# Patient Record
Sex: Female | Born: 1985 | Race: Black or African American | Hispanic: No | Marital: Single | Smoking: Never smoker
Health system: Southern US, Community
[De-identification: ages and names within clinical notes are randomized; demographics above are authoritative.]

## PROBLEM LIST (undated history)

## (undated) ENCOUNTER — Inpatient Hospital Stay (HOSPITAL_COMMUNITY): Payer: Self-pay

## (undated) DIAGNOSIS — E282 Polycystic ovarian syndrome: Secondary | ICD-10-CM

## (undated) DIAGNOSIS — R87629 Unspecified abnormal cytological findings in specimens from vagina: Secondary | ICD-10-CM

## (undated) DIAGNOSIS — B009 Herpesviral infection, unspecified: Secondary | ICD-10-CM

## (undated) DIAGNOSIS — E119 Type 2 diabetes mellitus without complications: Secondary | ICD-10-CM

## (undated) HISTORY — DX: Unspecified abnormal cytological findings in specimens from vagina: R87.629

---
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
## 2006-03-26 DIAGNOSIS — F5105 Insomnia due to other mental disorder: Secondary | ICD-10-CM | POA: Insufficient documentation

## 2006-03-26 DIAGNOSIS — J309 Allergic rhinitis, unspecified: Secondary | ICD-10-CM | POA: Insufficient documentation

## 2006-03-26 DIAGNOSIS — L708 Other acne: Secondary | ICD-10-CM | POA: Insufficient documentation

## 2006-03-26 DIAGNOSIS — G43909 Migraine, unspecified, not intractable, without status migrainosus: Secondary | ICD-10-CM | POA: Insufficient documentation

## 2006-10-08 ENCOUNTER — Telehealth: Payer: Self-pay | Admitting: *Deleted

## 2007-04-25 ENCOUNTER — Emergency Department (HOSPITAL_COMMUNITY): Admission: EM | Admit: 2007-04-25 | Discharge: 2007-04-26 | Payer: Self-pay | Admitting: Emergency Medicine

## 2007-07-05 ENCOUNTER — Inpatient Hospital Stay (HOSPITAL_COMMUNITY): Admission: AD | Admit: 2007-07-05 | Discharge: 2007-07-05 | Payer: Self-pay | Admitting: Family Medicine

## 2007-09-26 ENCOUNTER — Inpatient Hospital Stay (HOSPITAL_COMMUNITY): Admission: AD | Admit: 2007-09-26 | Discharge: 2007-09-26 | Payer: Self-pay | Admitting: Obstetrics & Gynecology

## 2007-10-05 ENCOUNTER — Ambulatory Visit (HOSPITAL_COMMUNITY): Admission: RE | Admit: 2007-10-05 | Discharge: 2007-10-05 | Payer: Self-pay | Admitting: Obstetrics and Gynecology

## 2007-10-22 ENCOUNTER — Inpatient Hospital Stay (HOSPITAL_COMMUNITY): Admission: AD | Admit: 2007-10-22 | Discharge: 2007-10-22 | Payer: Self-pay | Admitting: Obstetrics and Gynecology

## 2008-03-01 ENCOUNTER — Inpatient Hospital Stay (HOSPITAL_COMMUNITY): Admission: AD | Admit: 2008-03-01 | Discharge: 2008-03-04 | Payer: Self-pay | Admitting: Obstetrics & Gynecology

## 2008-03-23 ENCOUNTER — Inpatient Hospital Stay (HOSPITAL_COMMUNITY): Admission: AD | Admit: 2008-03-23 | Discharge: 2008-03-23 | Payer: Self-pay | Admitting: Obstetrics and Gynecology

## 2009-04-06 DIAGNOSIS — M545 Low back pain, unspecified: Secondary | ICD-10-CM | POA: Insufficient documentation

## 2009-04-23 ENCOUNTER — Encounter: Admission: RE | Admit: 2009-04-23 | Discharge: 2009-07-02 | Payer: Self-pay | Admitting: Family Medicine

## 2010-05-14 LAB — URINALYSIS, ROUTINE W REFLEX MICROSCOPIC
Bilirubin Urine: NEGATIVE
Glucose, UA: NEGATIVE mg/dL
Ketones, ur: NEGATIVE mg/dL
Leukocytes, UA: NEGATIVE
Nitrite: NEGATIVE
Protein, ur: NEGATIVE mg/dL
Specific Gravity, Urine: 1.02 (ref 1.005–1.030)
Urobilinogen, UA: 0.2 mg/dL (ref 0.0–1.0)
pH: 6.5 (ref 5.0–8.0)

## 2010-05-14 LAB — CBC
HCT: 29.3 % — ABNORMAL LOW (ref 36.0–46.0)
HCT: 33.5 % — ABNORMAL LOW (ref 36.0–46.0)
HCT: 41.7 % (ref 36.0–46.0)
HCT: 35.8 % — ABNORMAL LOW (ref 36.0–46.0)
Hemoglobin: 11.4 g/dL — ABNORMAL LOW (ref 12.0–15.0)
Hemoglobin: 13.9 g/dL (ref 12.0–15.0)
Hemoglobin: 9.9 g/dL — ABNORMAL LOW (ref 12.0–15.0)
Hemoglobin: 12 g/dL (ref 12.0–15.0)
MCHC: 33.3 g/dL (ref 30.0–36.0)
MCHC: 33.9 g/dL (ref 30.0–36.0)
MCHC: 33.9 g/dL (ref 30.0–36.0)
MCHC: 33.5 g/dL (ref 30.0–36.0)
MCV: 88.4 fL (ref 78.0–100.0)
MCV: 88.6 fL (ref 78.0–100.0)
MCV: 89.7 fL (ref 78.0–100.0)
MCV: 87.4 fL (ref 78.0–100.0)
Platelets: 159 10*3/uL (ref 150–400)
Platelets: 174 10*3/uL (ref 150–400)
Platelets: 192 10*3/uL (ref 150–400)
Platelets: 321 10*3/uL (ref 150–400)
RBC: 3.26 MIL/uL — ABNORMAL LOW (ref 3.87–5.11)
RBC: 3.79 MIL/uL — ABNORMAL LOW (ref 3.87–5.11)
RBC: 4.71 MIL/uL (ref 3.87–5.11)
RBC: 4.1 MIL/uL (ref 3.87–5.11)
RDW: 14.5 % (ref 11.5–15.5)
RDW: 15.1 % (ref 11.5–15.5)
RDW: 15.2 % (ref 11.5–15.5)
RDW: 15 % (ref 11.5–15.5)
WBC: 13.4 10*3/uL — ABNORMAL HIGH (ref 4.0–10.5)
WBC: 14.6 10*3/uL — ABNORMAL HIGH (ref 4.0–10.5)
WBC: 16.4 10*3/uL — ABNORMAL HIGH (ref 4.0–10.5)
WBC: 18.2 10*3/uL — ABNORMAL HIGH (ref 4.0–10.5)

## 2010-05-14 LAB — DIFFERENTIAL
Basophils Absolute: 0 10*3/uL (ref 0.0–0.1)
Basophils Relative: 0 % (ref 0–1)
Eosinophils Absolute: 0 10*3/uL (ref 0.0–0.7)
Eosinophils Relative: 0 % (ref 0–5)
Lymphocytes Relative: 6 % — ABNORMAL LOW (ref 12–46)
Lymphs Abs: 1.1 10*3/uL (ref 0.7–4.0)
Monocytes Absolute: 0.5 10*3/uL (ref 0.1–1.0)
Monocytes Relative: 3 % (ref 3–12)
Neutro Abs: 16.5 10*3/uL — ABNORMAL HIGH (ref 1.7–7.7)
Neutrophils Relative %: 91 % — ABNORMAL HIGH (ref 43–77)

## 2010-05-14 LAB — COMPREHENSIVE METABOLIC PANEL
ALT: 17 U/L (ref 0–35)
AST: 17 U/L (ref 0–37)
Albumin: 3.2 g/dL — ABNORMAL LOW (ref 3.5–5.2)
Alkaline Phosphatase: 91 U/L (ref 39–117)
BUN: 7 mg/dL (ref 6–23)
CO2: 26 mEq/L (ref 19–32)
Calcium: 8.9 mg/dL (ref 8.4–10.5)
Chloride: 105 mEq/L (ref 96–112)
Creatinine, Ser: 0.64 mg/dL (ref 0.4–1.2)
GFR calc Af Amer: >60 mL/min (ref >60)
GFR calc non Af Amer: >60 mL/min (ref >60)
Glucose, Bld: 123 mg/dL — ABNORMAL HIGH (ref 70–99)
Potassium: 4 mEq/L (ref 3.5–5.1)
Sodium: 138 mEq/L (ref 135–145)
Total Bilirubin: 0.7 mg/dL (ref 0.3–1.2)
Total Protein: 6.8 g/dL (ref 6.0–8.3)

## 2010-05-14 LAB — GC/CHLAMYDIA PROBE AMP, GENITAL
Chlamydia, DNA Probe: NEGATIVE
GC Probe Amp, Genital: NEGATIVE

## 2010-05-14 LAB — URINE MICROSCOPIC-ADD ON

## 2010-05-14 LAB — RPR: RPR Ser Ql: NONREACTIVE

## 2010-06-11 NOTE — Op Note (Signed)
[name], [name]              ACCOUNT NO.:  [id number]   MEDICAL RECORD NO.:  [id number]          PATIENT TYPE:  INP   LOCATION:  9122                          FACILITY:  [facility]   PHYSICIAN:  Carrington Clamp, M.D. DATE OF BIRTH:  1985/09/24   DATE OF PROCEDURE:  03/01/2008  DATE OF DISCHARGE:                               OPERATIVE REPORT   PREOPERATIVE DIAGNOSIS:  Arrest of active phase.   POSTOPERATIVE DIAGNOSIS:  Arrest of active phase.   PROCEDURES:  Primary low transverse cesarean section.   SURGEON:  Carrington Clamp, MD   ASSISTANTS:  None.   ANESTHESIA:  Epidural.   FINDINGS:  Female infant, vertex OP presentation, Apgars 9 and 9.  Weight  was 9 pounds 7 ounces.  Normal tubes, ovaries, and uterus were otherwise  seen.   SPECIMENS:  None.   ESTIMATED BLOOD LOSS:  800 mL.   IV FLUIDS:  1200 mL.   URINE OUTPUT:  175 mL.   COMPLICATIONS:  None.   MEDICATIONS:  Ancef and Pitocin.   The patient was sent to the PACU in good condition.   REASON FOR OPERATION:  Kristin Love was admitted this morning for  early labor and augmented and had a protracted labor course.  The  patient, however, remained at anterior lip for a significant amount of  time for about 4 hours.  The anterior lip was tight and would not be  reduced.  The patient reported that she had an ultrasound in the office  the previous week that indicated the baby was about 9 pounds.  It was  decided to do a C-section for arrest of active phase and all risks,  benefits, and alternatives were discussed with the patient.   TECHNIQUE:  After adequate epidural anesthesia was achieved, the patient  was prepped and draped in usual sterile fashion in dorsal supine  position with leftward tilt.  A Pfannenstiel skin incision with a  scalpel was carried down to the fascia with the Bovie cautery.  The  fascia was incised in the midline with the scalpel and carried in  transverse curvilinear manner with the  Mayo scissors.  Fascia was  reflected superiorly and inferiorly from the rectus muscle.  The rectus  muscle was split in the midline, the bowel free portion.  Peritoneum was  entered into bluntly.  The peritoneum was then opened in a superior  inferior manner with good visualization of bowel and the bladder.   The Alexis instrument was then introduced and the vesicouterine fascia  was tented up and incised in transverse curvilinear manner.  The bladder  flap was created with blunt dissection and 2-cm incision was made in the  upper portion of lower uterine segment.  Thick meconium was noted on  entry into the amnion.  The baby was identified in the vertex OP  presentation and delivered without complication through the incision.  Baby was bulb suctioned.  Cord was clamped and cut.  Baby was handed to  awaiting neonatologist.  The uterus was cleared with wet lap of all  debris of the placenta had been delivered.  Cord bloods had been  obtained.  The uterine incision was then closed with running locked  stitch of 0 Monocryl.  An imbricating layer of 0 Monocryl was performed  as well.  Once hemostasis was achieved, the peritoneum was closed after  irrigation with 2-0 Vicryl.  This incorporated the rectus muscles as a  separate layer.  The fascia was then closed with a running stitch of 0  Vicryl.  The subcutaneous tissue was rendered hemostatic with Bovie  cautery and irrigation.  The subcutaneous tissue was reapproximated with  interrupted stitches of 2-0 plain gut.  The skin was closed with  staples.      Carrington Clamp, M.D.  Electronically Signed     MH/MEDQ  D:  03/02/2008  T:  03/02/2008  Job:  [id number]

## 2010-06-14 NOTE — Discharge Summary (Signed)
Kristin Love, Kristin Love              ACCOUNT NO.:  [id number]   MEDICAL RECORD NO.:  [id number]          PATIENT TYPE:  INP   LOCATION:  9122                          FACILITY:  [facility]   PHYSICIAN:  Ilda Mori, M.D.   DATE OF BIRTH:  December 08, 1985   DATE OF ADMISSION:  03/01/2008  DATE OF DISCHARGE:  03/04/2008                               DISCHARGE SUMMARY   FINAL DIAGNOSES:  1. Intrauterine pregnancy at 40 plus weeks' gestation.  2. Active labor.  3. 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  4. Positive group B strep.  5. Arrest of the active phase of labor.   PROCEDURE:  Primary low transverse cesarean section.   SURGEON:  Dr. Carrington Clamp.   COMPLICATIONS:  None.   This is a 25 year old, G2, P0-0-1-0, presents at 4 plus weeks'  gestation and labor.  The patient's antepartum course had been  complicated by a history of herpes 2 virus.  The patient was started on  Valtrex at around 35 weeks' gestation.  The patient also is obese and  has been watched, had some ultrasounds to check growth.  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  The patient was  admitted in labor and started on her antibiotics for positive group B  strep culture.  The patient had a protracted labor curve and arrest of  the active phase of labor.  At this point, a decision was made to  proceed with a cesarean section.  The patient was taken to the operating  room on March 01, 2008, by Dr. Carrington Clamp where a primary low  transverse cesarean section was performed with delivery of a 9-pound and  7-ounce female infant with Apgars of 9 and 9.  The delivery went without  complications.  The patient's postoperative course was benign without  significant fevers.  The patient was felt ready for discharge on  postoperative day #2.  She was sent home on a regular diet, told to  decrease  activities, told to continue her vitamins, was given Percocet  #30, one to two every 4 hours as needed for pain, told she could use  Motrin 800 mg every day 8 hours as needed for pain and was to follow up  in our office in 4 weeks.  The little boy will be circumcised at his  pediatrician's office.  Instructions were reviewed with the patient.   LABS ON DISCHARGE:  The patient had a hemoglobin of 9.9, white blood  cell count of 14.6, and platelets of 159,000.      Leilani Able, P.A.-C.      Ilda Mori, M.D.  Electronically Signed    MB/MEDQ  D:  03/31/2008  T:  04/01/2008  Job:  [id number]

## 2010-10-21 LAB — POCT I-STAT, CHEM 8
BUN: 6
Calcium, Ion: 1.18
Chloride: 101
Creatinine, Ser: 0.8
Glucose, Bld: 99
HCT: 43
Hemoglobin: 14.6
Potassium: 3.6
Sodium: 138
TCO2: 27

## 2010-10-21 LAB — BASIC METABOLIC PANEL
BUN: 5 — ABNORMAL LOW
CO2: 26
Calcium: 9
Chloride: 104
Creatinine, Ser: 0.61
GFR calc Af Amer: >60
GFR calc non Af Amer: >60
Glucose, Bld: 101 — ABNORMAL HIGH
Potassium: 3.6
Sodium: 137

## 2010-10-21 LAB — ETHANOL: Alcohol, Ethyl (B): <5

## 2010-10-24 LAB — ABO/RH: ABO/RH(D): O POS

## 2010-10-24 LAB — URINALYSIS, ROUTINE W REFLEX MICROSCOPIC
Bilirubin Urine: NEGATIVE
Glucose, UA: NEGATIVE
Hgb urine dipstick: NEGATIVE
Ketones, ur: NEGATIVE
Nitrite: NEGATIVE
Protein, ur: NEGATIVE
Specific Gravity, Urine: 1.015
Urobilinogen, UA: 0.2
pH: 7

## 2010-10-24 LAB — CBC
HCT: 37.4
Hemoglobin: 13
MCHC: 34.9
MCV: 87
Platelets: 262
RBC: 4.29
RDW: 13.3
WBC: 9.7

## 2010-10-24 LAB — POCT PREGNANCY, URINE
Operator id: 251141
Preg Test, Ur: POSITIVE

## 2010-10-24 LAB — GC/CHLAMYDIA PROBE AMP, GENITAL
Chlamydia, DNA Probe: NEGATIVE
GC Probe Amp, Genital: NEGATIVE

## 2010-10-24 LAB — WET PREP, GENITAL
Clue Cells Wet Prep HPF POC: NONE SEEN
Trich, Wet Prep: NONE SEEN
Yeast Wet Prep HPF POC: NONE SEEN

## 2010-10-24 LAB — HCG, QUANTITATIVE, PREGNANCY: hCG, Beta Chain, Quant, S: 12810 — ABNORMAL HIGH

## 2012-12-15 ENCOUNTER — Encounter (HOSPITAL_BASED_OUTPATIENT_CLINIC_OR_DEPARTMENT_OTHER): Payer: Self-pay | Admitting: Emergency Medicine

## 2012-12-15 ENCOUNTER — Emergency Department (HOSPITAL_BASED_OUTPATIENT_CLINIC_OR_DEPARTMENT_OTHER)
Admission: EM | Admit: 2012-12-15 | Discharge: 2012-12-15 | Disposition: A | Discharge status: Home / Self Care | Payer: Self-pay | Attending: Emergency Medicine | Admitting: Emergency Medicine

## 2012-12-15 DIAGNOSIS — G8929 Other chronic pain: Secondary | ICD-10-CM | POA: Insufficient documentation

## 2012-12-15 DIAGNOSIS — M79671 Pain in right foot: Secondary | ICD-10-CM

## 2012-12-15 DIAGNOSIS — M25579 Pain in unspecified ankle and joints of unspecified foot: Secondary | ICD-10-CM | POA: Insufficient documentation

## 2012-12-15 MED ORDER — TRAMADOL HCL 50 MG PO TABS: 50.0000 mg | ORAL_TABLET | Freq: Four times a day (QID) | ORAL | Status: DC | PRN

## 2012-12-15 NOTE — ED Provider Notes (Signed)
CSN: [id number]     Arrival date & time 12/15/12  1854 History   First MD Initiated Contact with Patient 12/15/12 1907     Chief Complaint  Patient presents with  . Foot Pain   (Consider location/radiation/quality/duration/timing/severity/associated sxs/prior Treatment) Patient is a 27 y.o. female presenting with lower extremity pain. The history is provided by the patient. No language interpreter was used.  Foot Pain This is a chronic problem. The current episode started more than 1 month ago. Pertinent negatives include no chills, fever or numbness. Associated symptoms comments: Right foot pain for the past 6 months without known injury.. The symptoms are aggravated by standing and walking.    History reviewed. No pertinent past medical history. Past Surgical History  Procedure Laterality Date  . Cesarean section     History reviewed. No pertinent family history. History  Substance Use Topics  . Smoking status: Never Smoker   . Smokeless tobacco: Not on file  . Alcohol Use: No   OB History   Grav Para Term Preterm Abortions TAB SAB Ect Mult Living                 Review of Systems  Constitutional: Negative for fever and chills.  Musculoskeletal:       See HPI.  Neurological: Negative.  Negative for numbness.    Allergies  Review of patient's allergies indicates no known allergies.  Home Medications  No current outpatient prescriptions on file. BP 127/82  Pulse 100  Temp(Src) 98.4 F (36.9 C) (Oral)  Resp 18  Ht 5\' 7"  (1.702 m)  Wt 359 lb (162.841 kg)  BMI 56.21 kg/m2  SpO2 100%  LMP 12/15/2012 Physical Exam  Constitutional: She is oriented to person, place, and time. She appears well-developed and well-nourished.  Neck: Normal range of motion.  Pulmonary/Chest: Effort normal.  Musculoskeletal:  Right foot with mild swelling at proximal and dorsal forefoot. No discoloration or bony deformity. FROM. Ankle joint stable.   Neurological: She is alert and  oriented to person, place, and time.  Skin: Skin is warm and dry.    ED Course  Procedures (including critical care time) Labs Review Labs Reviewed - No data to display Imaging Review No results found.  EKG Interpretation   None       MDM  No diagnosis found. 1. Chronic right foot pain  ASO splint applied for comfort. Refer to orthopedics.    Arnoldo Hooker, PA-C 12/15/12 1956

## 2012-12-15 NOTE — ED Notes (Signed)
Pt c/o right foot pain w/o injury x 6 months

## 2012-12-15 NOTE — ED Provider Notes (Signed)
Medical screening examination/treatment/procedure(s) were performed by non-physician practitioner and as supervising physician I was immediately available for consultation/collaboration.  EKG Interpretation   None         Gwyneth Sprout, MD 12/15/12 2132

## 2012-12-16 ENCOUNTER — Encounter (HOSPITAL_COMMUNITY): Payer: Self-pay | Admitting: *Deleted

## 2012-12-16 ENCOUNTER — Inpatient Hospital Stay (HOSPITAL_COMMUNITY)
Admission: AD | Admit: 2012-12-16 | Discharge: 2012-12-16 | Disposition: A | Discharge status: Home / Self Care | Payer: Self-pay | Source: Ambulatory Visit | Attending: Obstetrics & Gynecology | Admitting: Obstetrics & Gynecology

## 2012-12-16 DIAGNOSIS — N920 Excessive and frequent menstruation with regular cycle: Secondary | ICD-10-CM | POA: Insufficient documentation

## 2012-12-16 DIAGNOSIS — R109 Unspecified abdominal pain: Secondary | ICD-10-CM | POA: Insufficient documentation

## 2012-12-16 LAB — CBC
HCT: 38.1 % (ref 36.0–46.0)
Hemoglobin: 12.7 g/dL (ref 12.0–15.0)
MCH: 28.1 pg (ref 26.0–34.0)
MCHC: 33.3 g/dL (ref 30.0–36.0)
MCV: 84.3 fL (ref 78.0–100.0)
Platelets: 249 10*3/uL (ref 150–400)
RBC: 4.52 MIL/uL (ref 3.87–5.11)
RDW: 13.9 % (ref 11.5–15.5)
WBC: 10.8 10*3/uL — ABNORMAL HIGH (ref 4.0–10.5)

## 2012-12-16 LAB — POCT PREGNANCY, URINE: Preg Test, Ur: NEGATIVE

## 2012-12-16 NOTE — MAU Provider Note (Signed)
History     CSN: [id number]  Arrival date and time: 12/16/12 1037   First Provider Initiated Contact with Patient 12/16/12 1200      Chief Complaint  Patient presents with  . Vaginal Bleeding   HPI [name] is a 27 y.o. G1P1000 who presents to MAU today with complaint of heavy bleeding this morning. [facility] She was given OCPs at that time as well and told to start after she had a period. She states that she only had spotting for a day 3 times since then. She started bleeding heavily this morning. She states that she soaked a towel on the way here from home. She has also had mild lower abdominal cramping. She took 800 mg Ibuprofen this morning which helped. She is sexually active with the same partner x years and does not use protection. She denies vaginal discharge and declines STD testing today. She denies N/V/D or constipation, weakness or dizziness. She states that she did feel somewhat lightheaded this morning.   OB History   Grav Para Term Preterm Abortions TAB SAB Ect Mult Living   1 1 1              History reviewed. No pertinent past medical history.  Past Surgical History  Procedure Laterality Date  . Cesarean section      History reviewed. No pertinent family history.  History  Substance Use Topics  . Smoking status: Never Smoker   . Smokeless tobacco: Not on file  . Alcohol Use: No    Allergies:  Allergies  Allergen Reactions  . Fish Allergy Swelling    No prescriptions prior to admission    Review of Systems  Constitutional: Negative for fever and malaise/fatigue.  Gastrointestinal: Positive for abdominal pain. Negative for nausea, vomiting, diarrhea and constipation.  Genitourinary: Negative for dysuria, urgency and frequency.       + vaginal bleeding Neg - vaginal discharge  Neurological: Negative for dizziness, loss of consciousness and weakness.   Physical Exam   Blood pressure  120/71, pulse 93, temperature 98.3 F (36.8 C), temperature source Oral, resp. rate 18, height 5\' 7"  (1.702 m), weight 359 lb (162.841 kg), last menstrual period 12/15/2012.  Physical Exam  Constitutional: She is oriented to person, place, and time. She appears well-developed and well-nourished. No distress.  HENT:  Head: Normocephalic and atraumatic.  Cardiovascular: Normal rate, regular rhythm and normal heart sounds.   Respiratory: Effort normal and breath sounds normal. No respiratory distress.  GI: Soft. Bowel sounds are normal. She exhibits no distension and no mass. There is no tenderness. There is no rebound and no guarding.  Genitourinary: Uterus is not enlarged (exam limited by body habitus) and not tender. Cervix exhibits no motion tenderness and no discharge. Right adnexum displays no mass and no tenderness. Left adnexum displays no mass and no tenderness. There is bleeding (small amount of blood noted, 2 fox swabs required to remove from vagina) around the vagina. No vaginal discharge found.  Neurological: She is alert and oriented to person, place, and time.  Skin: Skin is warm and dry. No erythema.  Psychiatric: She has a normal mood and affect.   Results for orders placed during the hospital encounter of 12/16/12 (from the past 24 hour(s))  POCT PREGNANCY, URINE     Status: None   Collection Time    12/16/12 11:49 AM      Result Value Range   Preg Test,  Ur NEGATIVE  NEGATIVE  CBC     Status: Abnormal   Collection Time    12/16/12 12:00 PM      Result Value Range   WBC 10.8 (*) 4.0 - 10.5 K/uL   RBC 4.52  3.87 - 5.11 MIL/uL   Hemoglobin 12.7  12.0 - 15.0 g/dL   HCT 16.1  09.6 - 04.5 %   MCV 84.3  78.0 - 100.0 fL   MCH 28.1  26.0 - 34.0 pg   MCHC 33.3  30.0 - 36.0 g/dL   RDW 40.9  81.1 - 91.4 %   Platelets 249  150 - 400 K/uL    MAU Course  Procedures None  MDM CBC today Patient is hemodynamically stable  Assessment and Plan  A: Menorrhagia  P: Discharge  home [facility] and declines Rx Bleeding precautions discussed Ibuprofen PRN recommended for pain Patient [facility] if [facility] persist Patient may return to MAU as needed or if her condition were to change or worsen   Freddi Starr, PA-C  12/16/2012, 6:44 PM

## 2012-12-16 NOTE — MAU Note (Signed)
Pt reports she is having heavy vaginal bleeding this morning. Pt has not had a period since March. Stopped birth control (implanad)  in March. Has had some spotting  But no full periods since march

## 2012-12-16 NOTE — Discharge Instructions (Signed)
Menorrhagia °Dysfunctional uterine bleeding is different from a normal menstrual period. When periods are heavy or there is more bleeding than is usual for you, it is called menorrhagia. It may be caused by hormonal imbalance, or physical, metabolic, or other problems. Examination is necessary in order that your caregiver may treat treatable causes. If this is a continuing problem, a D&C may be needed. That means that the cervix (the opening of the uterus or womb) is dilated (stretched larger) and the lining of the uterus is scraped out. The tissue scraped out is then examined under a microscope by a specialist (pathologist) to make sure there is nothing of concern that needs further or more extensive treatment. °HOME CARE INSTRUCTIONS  °· If medications were prescribed, take exactly as directed. Do not change or switch medications without consulting your caregiver. °· Long term heavy bleeding may result in iron deficiency. Your caregiver may have prescribed iron pills. They help replace the iron your body lost from heavy bleeding. Take exactly as directed. Iron may cause constipation. If this becomes a problem, increase the bran, fruits, and roughage in your diet. °· Do not take aspirin or medicines that contain aspirin one week before or during your menstrual period. Aspirin may make the bleeding worse. °· If you need to change your sanitary pad or tampon more than once every 2 hours, stay in bed and rest as much as possible until the bleeding stops. °· Eat well-balanced meals. Eat foods high in iron. Examples are leafy green vegetables, meat, liver, eggs, and whole grain breads and cereals. Do not try to lose weight until the abnormal bleeding has stopped and your blood iron level is back to normal. °SEEK MEDICAL CARE IF:  °· You need to change your sanitary pad or tampon more than once an hour. °· You develop nausea (feeling sick to your stomach) and vomiting, dizziness, or diarrhea while you are taking your  medicine. °· You have any problems that may be related to the medicine you are taking. °SEEK IMMEDIATE MEDICAL CARE IF:  °· You have a fever. °· You develop chills. °· You develop severe bleeding or start to pass blood clots. °· You feel dizzy or faint. °MAKE SURE YOU:  °· Understand these instructions. °· Will watch your condition. °· Will get help right away if you are not doing well or get worse. °Document Released: 01/13/2005 Document Revised: 04/07/2011 Document Reviewed: 07/04/2012 °ExitCare® Patient Information ©2014 [employer]. ° °

## 2012-12-19 NOTE — MAU Provider Note (Signed)
Attestation of Attending Supervision of Advanced Practitioner (CNM/NP): Evaluation and management procedures were performed by the Advanced Practitioner under my supervision and collaboration. I have reviewed the Advanced Practitioner's note and chart, and I agree with the management and plan.  Janeil Schexnayder H. 12:45 PM

## 2013-11-28 ENCOUNTER — Encounter (HOSPITAL_COMMUNITY): Payer: Self-pay | Admitting: *Deleted

## 2014-07-21 ENCOUNTER — Encounter (HOSPITAL_COMMUNITY): Payer: Self-pay | Admitting: *Deleted

## 2014-07-21 ENCOUNTER — Inpatient Hospital Stay (HOSPITAL_COMMUNITY): Payer: Self-pay

## 2014-07-21 ENCOUNTER — Inpatient Hospital Stay (HOSPITAL_COMMUNITY)
Admission: AD | Admit: 2014-07-21 | Discharge: 2014-07-21 | Disposition: A | Discharge status: Home / Self Care | Payer: Self-pay | Source: Ambulatory Visit | Attending: Obstetrics and Gynecology | Admitting: Obstetrics and Gynecology

## 2014-07-21 DIAGNOSIS — Z3A01 Less than 8 weeks gestation of pregnancy: Secondary | ICD-10-CM | POA: Insufficient documentation

## 2014-07-21 DIAGNOSIS — O418X10 Other specified disorders of amniotic fluid and membranes, first trimester, not applicable or unspecified: Secondary | ICD-10-CM

## 2014-07-21 DIAGNOSIS — Z3491 Encounter for supervision of normal pregnancy, unspecified, first trimester: Secondary | ICD-10-CM

## 2014-07-21 DIAGNOSIS — O208 Other hemorrhage in early pregnancy: Secondary | ICD-10-CM | POA: Insufficient documentation

## 2014-07-21 DIAGNOSIS — O209 Hemorrhage in early pregnancy, unspecified: Secondary | ICD-10-CM

## 2014-07-21 DIAGNOSIS — O468X1 Other antepartum hemorrhage, first trimester: Secondary | ICD-10-CM

## 2014-07-21 HISTORY — DX: Herpesviral infection, unspecified: B00.9

## 2014-07-21 LAB — WET PREP, GENITAL
Clue Cells Wet Prep HPF POC: NONE SEEN
Trich, Wet Prep: NONE SEEN
Yeast Wet Prep HPF POC: NONE SEEN

## 2014-07-21 LAB — URINALYSIS, ROUTINE W REFLEX MICROSCOPIC
Bilirubin Urine: NEGATIVE
Glucose, UA: NEGATIVE mg/dL
Hgb urine dipstick: NEGATIVE
Ketones, ur: NEGATIVE mg/dL
Leukocytes, UA: NEGATIVE
Nitrite: NEGATIVE
Protein, ur: NEGATIVE mg/dL
Specific Gravity, Urine: 1.025 (ref 1.005–1.030)
Urobilinogen, UA: 1 mg/dL (ref 0.0–1.0)
pH: 7.5 (ref 5.0–8.0)

## 2014-07-21 LAB — CBC
HCT: 37.5 % (ref 36.0–46.0)
Hemoglobin: 13 g/dL (ref 12.0–15.0)
MCH: 29.5 pg (ref 26.0–34.0)
MCHC: 34.7 g/dL (ref 30.0–36.0)
MCV: 85 fL (ref 78.0–100.0)
Platelets: 262 10*3/uL (ref 150–400)
RBC: 4.41 MIL/uL (ref 3.87–5.11)
RDW: 13.6 % (ref 11.5–15.5)
WBC: 11.7 10*3/uL — ABNORMAL HIGH (ref 4.0–10.5)

## 2014-07-21 LAB — POCT PREGNANCY, URINE: Preg Test, Ur: POSITIVE — AB

## 2014-07-21 LAB — HCG, QUANTITATIVE, PREGNANCY: hCG, Beta Chain, Quant, S: 16421 m[IU]/mL — ABNORMAL HIGH (ref <5)

## 2014-07-21 NOTE — MAU Provider Note (Signed)
History     CSN: [id number]  Arrival date and time: 07/21/14 1404   None     No chief complaint on file.  HPI  Patient is 29 y.o. [id number] [redacted]w[redacted]d here with complaints of cramping since yesterday, some spotting this morning.  Also noted some vaginal irritation.  Denies abdominal pain. - hx of chlamydia as teenager, no hx of ectopic pregnancy  Prenatal care: plans on going to [facility], currently taking prenatal vitamin  Past Medical History  Diagnosis Date  . Herpes     Past Surgical History  Procedure Laterality Date  . Cesarean section      No family history on file.  History  Substance Use Topics  . Smoking status: Never Smoker   . Smokeless tobacco: Not on file  . Alcohol Use: No    Allergies:  Allergies  Allergen Reactions  . Fish Allergy Swelling    Prescriptions prior to admission  Medication Sig Dispense Refill Last Dose  . ibuprofen (ADVIL,MOTRIN) 800 MG tablet Take 800 mg by mouth every 8 (eight) hours as needed.   12/16/2012 at Unknown time  . naproxen sodium (ANAPROX) 220 MG tablet Take 220 mg by mouth 2 (two) times daily with a meal.   12/15/2012 at Unknown time    Review of Systems  Constitutional: Negative for fever and chills.  HENT: Negative for congestion.   Respiratory: Negative for cough and shortness of breath.   Cardiovascular: Negative for chest pain and leg swelling.  Gastrointestinal: Positive for nausea. Negative for heartburn, vomiting, diarrhea and constipation.  Genitourinary: Negative for dysuria, urgency, frequency and hematuria.  Skin: Positive for itching (vaginal). Negative for rash.  Neurological: Negative for dizziness, loss of consciousness and headaches.   Physical Exam   Blood pressure 116/69, pulse 92, temperature 98.2 F (36.8 C), temperature source Oral, resp. rate 18, last menstrual period 06/05/2014.  Physical Exam  Constitutional: She is oriented to person, place, and time. She appears well-developed and  well-nourished.  HENT:  Head: Normocephalic and atraumatic.  Eyes: Conjunctivae and EOM are normal.  Neck: Normal range of motion.  Cardiovascular: Normal rate.   Respiratory: Effort normal. No respiratory distress.  GI: Soft. She exhibits no distension. There is no tenderness.  Musculoskeletal: Normal range of motion. She exhibits no edema.  Neurological: She is alert and oriented to person, place, and time.  Skin: Skin is warm and dry. No erythema.   Results for orders placed or performed during the hospital encounter of 07/21/14 (from the past 24 hour(s))  Urinalysis, Routine w reflex microscopic (not at [facility])     Status: None   Collection Time: 07/21/14  2:20 PM  Result Value Ref Range   Color, Urine YELLOW YELLOW   APPearance CLEAR CLEAR   Specific Gravity, Urine 1.025 1.005 - 1.030   pH 7.5 5.0 - 8.0   Glucose, UA NEGATIVE NEGATIVE mg/dL   Hgb urine dipstick NEGATIVE NEGATIVE   Bilirubin Urine NEGATIVE NEGATIVE   Ketones, ur NEGATIVE NEGATIVE mg/dL   Protein, ur NEGATIVE NEGATIVE mg/dL   Urobilinogen, UA 1.0 0.0 - 1.0 mg/dL   Nitrite NEGATIVE NEGATIVE   Leukocytes, UA NEGATIVE NEGATIVE  Pregnancy, urine POC     Status: Abnormal   Collection Time: 07/21/14  2:43 PM  Result Value Ref Range   Preg Test, Ur POSITIVE (A) NEGATIVE  Wet prep, genital     Status: Abnormal   Collection Time: 07/21/14  3:04 PM  Result Value Ref Range   Yeast  Wet Prep HPF POC NONE SEEN NONE SEEN   Trich, Wet Prep NONE SEEN NONE SEEN   Clue Cells Wet Prep HPF POC NONE SEEN NONE SEEN   WBC, Wet Prep HPF POC MODERATE (A) NONE SEEN  CBC     Status: Abnormal   Collection Time: 07/21/14  3:15 PM  Result Value Ref Range   WBC 11.7 (H) 4.0 - 10.5 K/uL   RBC 4.41 3.87 - 5.11 MIL/uL   Hemoglobin 13.0 12.0 - 15.0 g/dL   HCT 37.5 36.0 - 46.0 %   MCV 85.0 78.0 - 100.0 fL   MCH 29.5 26.0 - 34.0 pg   MCHC 34.7 30.0 - 36.0 g/dL   RDW 13.6 11.5 - 15.5 %   Platelets 262 150 - 400 K/uL  hCG, quantitative,  pregnancy     Status: Abnormal   Collection Time: 07/21/14  3:15 PM  Result Value Ref Range   hCG, Beta Chain, Quant, S 16421 (H) <5 mIU/mL   US Ob Comp Less 14 Wks  07/21/2014   CLINICAL DATA:  Vaginal bleeding.  EXAM: OBSTETRIC <14 WK [place] AND TRANSVAGINAL OB US  TECHNIQUE: Both transabdominal and transvaginal ultrasound examinations were performed for complete evaluation of the gestation as well as the maternal uterus, adnexal regions, and pelvic cul-de-sac. Transvaginal technique was performed to assess early pregnancy.  COMPARISON:  None.  FINDINGS: Intrauterine gestational sac: Single  Yolk sac:  Yes  Embryo:  Yes  Cardiac Activity: Yes  Heart Rate: 116  bpm  CRL:  4.7  mm   6 w   2 d                  [place] EDC: 03/14/2015  Maternal uterus/adnexae:  Subchorionic hemorrhage: Small subchorionic hemorrhage. This measures 2.6 x 1.2 x 1.2 cm  Right ovary: Not visualized  Left ovary: Normal  Other :None  Free fluid:  None  IMPRESSION: 1. Single living intrauterine gestation. The estimated gestational age is 6 weeks and 2 days. 2. Small subchorionic hemorrhage.   Electronically Signed   By: Kerby Moors M.D.   On: 07/21/2014 16:26   US Ob Transvaginal  07/21/2014   CLINICAL DATA:  Vaginal bleeding.  EXAM: OBSTETRIC <14 WK [place] AND TRANSVAGINAL OB US  TECHNIQUE: Both transabdominal and transvaginal ultrasound examinations were performed for complete evaluation of the gestation as well as the maternal uterus, adnexal regions, and pelvic cul-de-sac. Transvaginal technique was performed to assess early pregnancy.  COMPARISON:  None.  FINDINGS: Intrauterine gestational sac: Single  Yolk sac:  Yes  Embryo:  Yes  Cardiac Activity: Yes  Heart Rate: 116  bpm  CRL:  4.7  mm   6 w   2 d                  [place] EDC: 03/14/2015  Maternal uterus/adnexae:  Subchorionic hemorrhage: Small subchorionic hemorrhage. This measures 2.6 x 1.2 x 1.2 cm  Right ovary: Not visualized  Left ovary: Normal  Other :None  Free fluid:  None   IMPRESSION: 1. Single living intrauterine gestation. The estimated gestational age is 6 weeks and 2 days. 2. Small subchorionic hemorrhage.   Electronically Signed   By: Kerby Moors M.D.   On: 07/21/2014 16:26   MAU Course  Procedures  MDM Exam CBC, HCG quant, blood type reviewed and O pos Trans vaginal sono ordered  Assessment and Plan  Patient is 29 y.o. [id number] [redacted]w[redacted]d reporting vaginal bleeding and cramping now undergoing evaluation  to rule out ectopic pregnancy - care transferred to [facility] 4:14 PM, she will follow up labs and ultrasound and dispo patient appropriately    ACOSTA,KRISTY ROCIO   07/21/2014, 2:47 PM   A:  Vaginal bleeding in the first trimester       Viable Intrauterine pregnancy [redacted]w[redacted]d       Small subchorionic Hemorrhage  P:  Explained U/S findings to patient      Pelvic rest until bleeding stops      Patient plans care at [facility] for appt      Return for worsening sxs      Verification letter given to patient

## 2014-07-21 NOTE — MAU Note (Signed)
Cramping in lower abdomen since yesterday morning. C/O slight spotting when she wiped this AM and states she feels irritated in vaginal area. States she has some vaginal D/C, but cannot describe. States it just feels damp in panties. + HPT  June 8th.

## 2014-07-21 NOTE — Discharge Instructions (Signed)
Subchorionic Hematoma A subchorionic hematoma is a gathering of blood between the outer wall of the placenta and the inner wall of the womb (uterus). The placenta is the organ that connects the fetus to the wall of the uterus. The placenta performs the feeding, breathing (oxygen to the fetus), and waste removal (excretory work) of the fetus.  Subchorionic hematoma is the most common abnormality found on a result from ultrasonography done during the first trimester or early second trimester of pregnancy. If there has been little or no vaginal bleeding, early small hematomas usually shrink on their own and do not affect your baby or pregnancy. The blood is gradually absorbed over 1-2 weeks. When bleeding starts later in pregnancy or the hematoma is larger or occurs in an older pregnant woman, the outcome may not be as good. Larger hematomas may get bigger, which increases the chances for miscarriage. Subchorionic hematoma also increases the risk of premature detachment of the placenta from the uterus, preterm (premature) labor, and stillbirth. HOME CARE INSTRUCTIONS  Stay on bed rest if your health care provider recommends this. Although bed rest will not prevent more bleeding or prevent a miscarriage, your health care provider may recommend bed rest until you are advised otherwise.  Avoid heavy lifting (more than 10 lb [4.5 kg]), exercise, sexual intercourse, or douching as directed by your health care provider.  Keep track of the number of pads you use each day and how soaked (saturated) they are. Write down this information.  Do not use tampons.  Keep all follow-up appointments as directed by your health care provider. Your health care provider may ask you to have follow-up blood tests or ultrasound tests or both. SEEK IMMEDIATE MEDICAL CARE IF:  You have severe cramps in your stomach, back, abdomen, or pelvis.  You have a fever.  You pass large clots or tissue. Save any tissue for your health  care provider to look at.  Your bleeding increases or you become lightheaded, feel weak, or have fainting episodes. Document Released: 04/30/2006 Document Revised: 05/30/2013 Document Reviewed: 08/12/2012 [facility] Patient Information 2015 [place], [place]. This information is not intended to replace advice given to you by your health care provider. Make sure you discuss any questions you have with your health care provider. Vaginal Bleeding During Pregnancy, First Trimester A small amount of bleeding (spotting) from the vagina is relatively common in early pregnancy. It usually stops on its own. Various things may cause bleeding or spotting in early pregnancy. Some bleeding may be related to the pregnancy, and some may not. In most cases, the bleeding is normal and is not a problem. However, bleeding can also be a sign of something serious. Be sure to tell your health care provider about any vaginal bleeding right away. Some possible causes of vaginal bleeding during the first trimester include:  Infection or inflammation of the cervix.  Growths (polyps) on the cervix.  Miscarriage or threatened miscarriage.  Pregnancy tissue has developed outside of the uterus and in a fallopian tube (tubal pregnancy).  Tiny cysts have developed in the uterus instead of pregnancy tissue (molar pregnancy). HOME CARE INSTRUCTIONS  Watch your condition for any changes. The following actions may help to lessen any discomfort you are feeling:  Follow your health care provider's instructions for limiting your activity. If your health care provider orders bed rest, you may need to stay in bed and only get up to use the bathroom. However, your health care provider may allow you to continue light  activity.  If needed, make plans for someone to help with your regular activities and responsibilities while you are on bed rest.  Keep track of the number of pads you use each day, how often you change pads, and how soaked  (saturated) they are. Write this down.  Do not use tampons. Do not douche.  Do not have sexual intercourse or orgasms until approved by your health care provider.  If you pass any tissue from your vagina, save the tissue so you can show it to your health care provider.  Only take over-the-counter or prescription medicines as directed by your health care provider.  Do not take aspirin because it can make you bleed.  Keep all follow-up appointments as directed by your health care provider. SEEK MEDICAL CARE IF:  You have any vaginal bleeding during any part of your pregnancy.  You have cramps or labor pains.  You have a fever, not controlled by medicine. SEEK IMMEDIATE MEDICAL CARE IF:   You have severe cramps in your back or belly (abdomen).  You pass large clots or tissue from your vagina.  Your bleeding increases.  You feel light-headed or weak, or you have fainting episodes.  You have chills.  You are leaking fluid or have a gush of fluid from your vagina.  You pass out while having a bowel movement. MAKE SURE YOU:  Understand these instructions.  Will watch your condition.  Will get help right away if you are not doing well or get worse. Document Released: 10/23/2004 Document Revised: 01/18/2013 Document Reviewed: 09/20/2012 [facility] Patient Information 2015 [place], [place]. This information is not intended to replace advice given to you by your health care provider. Make sure you discuss any questions you have with your health care provider.

## 2014-07-22 ENCOUNTER — Encounter (HOSPITAL_COMMUNITY): Payer: Self-pay | Admitting: Emergency Medicine

## 2014-07-22 ENCOUNTER — Emergency Department (HOSPITAL_COMMUNITY)
Admission: EM | Admit: 2014-07-22 | Discharge: 2014-07-22 | Disposition: A | Discharge status: Home / Self Care | Payer: Self-pay | Attending: Emergency Medicine | Admitting: Emergency Medicine

## 2014-07-22 ENCOUNTER — Emergency Department (HOSPITAL_COMMUNITY): Payer: Self-pay

## 2014-07-22 DIAGNOSIS — F419 Anxiety disorder, unspecified: Secondary | ICD-10-CM | POA: Insufficient documentation

## 2014-07-22 DIAGNOSIS — Z79899 Other long term (current) drug therapy: Secondary | ICD-10-CM | POA: Insufficient documentation

## 2014-07-22 DIAGNOSIS — R079 Chest pain, unspecified: Secondary | ICD-10-CM

## 2014-07-22 DIAGNOSIS — R0789 Other chest pain: Secondary | ICD-10-CM | POA: Insufficient documentation

## 2014-07-22 DIAGNOSIS — Z8619 Personal history of other infectious and parasitic diseases: Secondary | ICD-10-CM | POA: Insufficient documentation

## 2014-07-22 DIAGNOSIS — R200 Anesthesia of skin: Secondary | ICD-10-CM | POA: Insufficient documentation

## 2014-07-22 LAB — BASIC METABOLIC PANEL
Anion gap: 6 (ref 5–15)
BUN: 8 mg/dL (ref 6–20)
CO2: 24 mmol/L (ref 22–32)
Calcium: 8.9 mg/dL (ref 8.9–10.3)
Chloride: 107 mmol/L (ref 101–111)
Creatinine, Ser: 0.54 mg/dL (ref 0.44–1.00)
GFR calc Af Amer: >60 mL/min (ref >60)
GFR calc non Af Amer: >60 mL/min (ref >60)
Glucose, Bld: 102 mg/dL — ABNORMAL HIGH (ref 65–99)
Potassium: 3.8 mmol/L (ref 3.5–5.1)
Sodium: 137 mmol/L (ref 135–145)

## 2014-07-22 LAB — CBC WITH DIFFERENTIAL/PLATELET
Basophils Absolute: 0 10*3/uL (ref 0.0–0.1)
Basophils Relative: 0 % (ref 0–1)
Eosinophils Absolute: 0.2 10*3/uL (ref 0.0–0.7)
Eosinophils Relative: 2 % (ref 0–5)
HCT: 37.4 % (ref 36.0–46.0)
Hemoglobin: 12.4 g/dL (ref 12.0–15.0)
Lymphocytes Relative: 23 % (ref 12–46)
Lymphs Abs: 2.3 10*3/uL (ref 0.7–4.0)
MCH: 28.2 pg (ref 26.0–34.0)
MCHC: 33.2 g/dL (ref 30.0–36.0)
MCV: 85.2 fL (ref 78.0–100.0)
Monocytes Absolute: 0.5 10*3/uL (ref 0.1–1.0)
Monocytes Relative: 5 % (ref 3–12)
Neutro Abs: 7.2 10*3/uL (ref 1.7–7.7)
Neutrophils Relative %: 70 % (ref 43–77)
Platelets: 276 10*3/uL (ref 150–400)
RBC: 4.39 MIL/uL (ref 3.87–5.11)
RDW: 13.6 % (ref 11.5–15.5)
WBC: 10.2 10*3/uL (ref 4.0–10.5)

## 2014-07-22 LAB — HIV ANTIBODY (ROUTINE TESTING W REFLEX): HIV Screen 4th Generation wRfx: NONREACTIVE

## 2014-07-22 MED ORDER — ACETAMINOPHEN 325 MG PO TABS
650.0000 mg | ORAL_TABLET | Freq: Once | ORAL | Status: AC
  Administered 2014-07-22: 650 mg via ORAL
  Filled 2014-07-22: qty 2

## 2014-07-22 NOTE — ED Notes (Signed)
Patient denies any lower abd pain.  Denies any spotting today.

## 2014-07-22 NOTE — ED Notes (Signed)
Per EMS, pt is [redacted] weeks pregnant. Pt was seen yesterday at [facility] for cramping and spotting, and was discharged. Pt reports that she was woken up by left sided chest pain, but states that she has been having this pain x 3 weeks. Pt is also reporting left arm numbness x 1 week. Pt is G3P1.

## 2014-07-22 NOTE — ED Provider Notes (Signed)
CSN: [id number]     Arrival date & time 07/22/14  5277 History   First MD Initiated Contact with Patient 07/22/14 [phone]     Chief Complaint  Patient presents with  . Chest Pain  . Numbness    Left arm   Kristin Love is a 29 y.o. female who is otherwise healthy and currently [redacted] weeks pregnant who presents to the ED complaining of 3 weeks of left sided chest pain that is non-radiating. She reports waking up this morning in worse pain. She complains of 10/10 left sided chest pain that is non-radiating since waking up at 4 am this morning. She reports she has had constant chest pain for the past 3 weeks, but that it is worse today. She is unable to identify aggravating or alleviating factors. She denies worsening chest pain on exertion. She also reports her left arm feels numb and tingling for the past week. She is G1P1011 and currently [redacted]w[redacted]d [facility]. She reports her vaginal spotting has resolved. She is followed by [facility] The patient denies fevers, chills, shortness of breath, palpitations, abdominal pain, nausea, vomiting, cough, wheezing, leg pain, leg swelling, or rashes. She denies injury to her chest. She denies personal or close family history of PE or DVT. She denies personal or close family family history of blood clotting disorders such as factor V Leiden, protein C or S deficiency. The patient denies personal or close family history of MI. Patient denies smoking. The patient denies endogenous estrogen use, recent long travel or hemoptysis.   (Consider location/radiation/quality/duration/timing/severity/associated sxs/prior Treatment) HPI  Past Medical History  Diagnosis Date  . Herpes    Past Surgical History  Procedure Laterality Date  . Cesarean section     Family History  Problem Relation Age of Onset  . Hypertension Mother    History  Substance Use Topics  . Smoking status: Never Smoker   . Smokeless  tobacco: Not on file  . Alcohol Use: No   OB History    Gravida Para Term Preterm AB TAB SAB Ectopic Multiple Living   '3 1 1  1 1    1     ' Review of Systems  Constitutional: Negative for fever and chills.  HENT: Negative for congestion and sore throat.   Eyes: Negative for visual disturbance.  Respiratory: Negative for cough, shortness of breath and wheezing.   Cardiovascular: Positive for chest pain. Negative for palpitations and leg swelling.  Gastrointestinal: Negative for nausea, vomiting, abdominal pain and diarrhea.  Genitourinary: Negative for dysuria, urgency, hematuria, vaginal bleeding, vaginal discharge and difficulty urinating.  Musculoskeletal: Negative for back pain and neck pain.  Skin: Negative for rash.  Neurological: Positive for numbness. Negative for syncope, weakness, light-headedness and headaches.  Psychiatric/Behavioral: The patient is not nervous/anxious.       Allergies  Shellfish allergy and Fish allergy  Home Medications   Prior to Admission medications   Medication Sig Start Date End Date Taking? Authorizing Provider  miconazole (MONISTAT 1 COMBINATION PACK) kit Place 1 each vaginally once.   Yes Historical Provider, MD  Prenatal Vit-Fe Fumarate-FA (PRENATAL MULTIVITAMIN) TABS tablet Take 1 tablet by mouth daily at 12 noon.   Yes Historical Provider, MD   BP 111/66 mmHg  Pulse 88  Temp(Src) 97.3 F (36.3 C) (Oral)  Resp 19  SpO2 98%  LMP 06/05/2014 (Exact Date) Physical Exam  Constitutional: She appears well-developed and well-nourished. No distress.  Nontoxic appearing.  Obese female.  HENT:  Head: Normocephalic and atraumatic.  Right Ear: External ear normal.  Left Ear: External ear normal.  Mouth/Throat: Oropharynx is clear and moist. No oropharyngeal exudate.  Eyes: Conjunctivae are normal. Pupils are equal, round, and reactive to light. Right eye exhibits no discharge. Left eye exhibits no discharge.  Neck: Normal range of motion.  Neck supple. No JVD present. No tracheal deviation present.  Cardiovascular: Normal rate, regular rhythm, normal heart sounds and intact distal pulses.  Exam reveals no gallop and no friction rub.   No murmur heard. Bilateral radial, posterior tibialis and dorsalis pedis pulses are intact.    Pulmonary/Chest: Effort normal and breath sounds normal. No respiratory distress. She has no wheezes. She has no rales. She exhibits tenderness.  Lungs are clear to auscultation bilaterally. Patient's left anterior chest wall is tender to palpation and reproduces her chest pain.  Abdominal: Soft. She exhibits no distension. There is no tenderness. There is no guarding.  Musculoskeletal: She exhibits no edema or tenderness.  No lower extremity edema or tenderness.  Lymphadenopathy:    She has no cervical adenopathy.  Neurological: She is alert. Coordination normal.  Skin: Skin is warm and dry. No rash noted. She is not diaphoretic. No erythema. No pallor.  Psychiatric: Her behavior is normal. Her mood appears anxious.  Patient appears anxious.   Nursing note and vitals reviewed.   ED Course  Procedures (including critical care time) Labs Review Labs Reviewed  BASIC METABOLIC PANEL - Abnormal; Notable for the following:    Glucose, Bld 102 (*)    All other components within normal limits  CBC WITH DIFFERENTIAL/PLATELET    Imaging Review Dg Chest 2 View  07/22/2014   CLINICAL DATA:  Subacute onset of left-sided chest pain, with left arm numbness. Initial encounter.  EXAM: CHEST  2 VIEW  COMPARISON:  None.  FINDINGS: The lungs are well-aerated. Mild vascular congestion is noted. There is no evidence of focal opacification, pleural effusion or pneumothorax.  The heart is borderline normal in size. No acute osseous abnormalities are seen.  IMPRESSION: Mild vascular congestion noted; lungs remain grossly clear.   Electronically Signed   By: Garald Balding M.D.   On: 07/22/2014 07:10   US Ob Comp Less 14  Wks  07/21/2014   CLINICAL DATA:  Vaginal bleeding.  EXAM: OBSTETRIC <14 WK [place] AND TRANSVAGINAL OB US  TECHNIQUE: Both transabdominal and transvaginal ultrasound examinations were performed for complete evaluation of the gestation as well as the maternal uterus, adnexal regions, and pelvic cul-de-sac. Transvaginal technique was performed to assess early pregnancy.  COMPARISON:  None.  FINDINGS: Intrauterine gestational sac: Single  Yolk sac:  Yes  Embryo:  Yes  Cardiac Activity: Yes  Heart Rate: 116  bpm  CRL:  4.7  mm   6 w   2 d                  [place] EDC: 03/14/2015  Maternal uterus/adnexae:  Subchorionic hemorrhage: Small subchorionic hemorrhage. This measures 2.6 x 1.2 x 1.2 cm  Right ovary: Not visualized  Left ovary: Normal  Other :None  Free fluid:  None  IMPRESSION: 1. Single living intrauterine gestation. The estimated gestational age is 6 weeks and 2 days. 2. Small subchorionic hemorrhage.   Electronically Signed   By: Kerby Moors M.D.   On: 07/21/2014 16:26   US Ob Transvaginal  07/21/2014   CLINICAL DATA:  Vaginal bleeding.  EXAM: OBSTETRIC <14 WK [place] AND  TRANSVAGINAL OB US  TECHNIQUE: Both transabdominal and transvaginal ultrasound examinations were performed for complete evaluation of the gestation as well as the maternal uterus, adnexal regions, and pelvic cul-de-sac. Transvaginal technique was performed to assess early pregnancy.  COMPARISON:  None.  FINDINGS: Intrauterine gestational sac: Single  Yolk sac:  Yes  Embryo:  Yes  Cardiac Activity: Yes  Heart Rate: 116  bpm  CRL:  4.7  mm   6 w   2 d                  [place] EDC: 03/14/2015  Maternal uterus/adnexae:  Subchorionic hemorrhage: Small subchorionic hemorrhage. This measures 2.6 x 1.2 x 1.2 cm  Right ovary: Not visualized  Left ovary: Normal  Other :None  Free fluid:  None  IMPRESSION: 1. Single living intrauterine gestation. The estimated gestational age is 6 weeks and 2 days. 2. Small subchorionic hemorrhage.   Electronically Signed   By:  Kerby Moors M.D.   On: 07/21/2014 16:26     EKG Interpretation   Date/Time:  Saturday July 22 2014 06:21:36 EDT Ventricular Rate:  95 PR Interval:  180 QRS Duration: 94 QT Interval:  345 QTC Calculation: 434 R Axis:   33 Text Interpretation:  Sinus rhythm No old tracing to compare Confirmed by  CAMPOS  MD, KEVIN ([id number]) on 07/22/2014 6:23:44 AM      Filed Vitals:   07/22/14 5400 07/22/14 0717  BP: 112/61 111/66  Pulse: 100 88  Temp: 97.3 F (36.3 C)   TempSrc: Oral   Resp: 16 19  SpO2: 97% 98%     MDM   Meds given in ED:  Medications  acetaminophen (TYLENOL) tablet 650 mg (650 mg Oral Given 07/22/14 0716)    New Prescriptions   No medications on file    Final diagnoses:  Chest wall pain  Chest pain, unspecified chest pain type   This is a 29 y.o. female who is otherwise healthy and currently [redacted] weeks pregnant who presents to the ED complaining of 3 weeks of left sided chest pain that is non-radiating. She reports waking up this morning in worse pain. She complains of 10/10 left sided chest pain that is non-radiating since waking up at 4 am this morning. She reports she has had constant chest pain for the past 3 weeks, but that it is worse today. She is unable to identify aggravating or alleviating factors. She denies worsening chest pain on exertion. On exam the patient is afebrile and nontoxic appearing. Patient appears very anxious. Her heart is regular rate and rhythm. EKG shows normal sinus rhythm. The patient is not tachycardic, tachypneic or hypoxic. The patient's left chest wall is tender to palpation and reproduces her chest pain. She is Well's criteria negative and I doubt PE. I also doubt ACS with reproducible chest wall pain in an otherwise healthy female. Chest x-ray is unremarkable. CBC is within normal limits. BMP is remodeling for glucose of 102. Patient reassurance given. We'll discharge patient and advised close follow up by her OB/GYN and PCP.  I advised  the patient to return to the emergency department with new or worsening symptoms or new concerns. The patient verbalized understanding and agreement with plan.    This patient was discussed with Dr. Venora Maples who agrees with assessment and plan.   Waynetta Pean, PA-C 07/22/14 [id number]  Jola Schmidt, MD 07/22/14 [phone]

## 2014-07-22 NOTE — ED Notes (Signed)
Patient continues to have pain.  She is to follow up with her MD and take tylenol as needed for pain.  Patient is alert.  Family with her at time of d/c

## 2014-07-22 NOTE — Discharge Instructions (Signed)
Chest Wall Pain °Chest wall pain is pain in or around the bones and muscles of your chest. It may take up to 6 weeks to get better. It may take longer if you must stay physically active in your work and activities.  °CAUSES  °Chest wall pain may happen on its own. However, it may be caused by: °· A viral illness like the flu. °· Injury. °· Coughing. °· Exercise. °· Arthritis. °· Fibromyalgia. °· Shingles. °HOME CARE INSTRUCTIONS  °· Avoid overtiring physical activity. Try not to strain or perform activities that cause pain. This includes any activities using your chest or your abdominal and side muscles, especially if heavy weights are used. °· Put ice on the sore area. °¨ Put ice in a plastic bag. °¨ Place a towel between your skin and the bag. °¨ Leave the ice on for 15-20 minutes per hour while awake for the first 2 days. °· Only take over-the-counter or prescription medicines for pain, discomfort, or fever as directed by your caregiver. °SEEK IMMEDIATE MEDICAL CARE IF:  °· Your pain increases, or you are very uncomfortable. °· You have a fever. °· Your chest pain becomes worse. °· You have new, unexplained symptoms. °· You have nausea or vomiting. °· You feel sweaty or lightheaded. °· You have a cough with phlegm (sputum), or you cough up blood. °MAKE SURE YOU:  °· Understand these instructions. °· Will watch your condition. °· Will get help right away if you are not doing well or get worse. °Document Released: 01/13/2005 Document Revised: 04/07/2011 Document Reviewed: 09/09/2010 °[employer]® Patient Information ©2015 [employer]. This information is not intended to replace advice given to you by your health care provider. Make sure you discuss any questions you have with your health care provider. ° °Chest Pain (Nonspecific) °It is often hard to give a specific diagnosis for the cause of chest pain. There is always a chance that your pain could be related to something serious, such as a heart attack or a blood  clot in the lungs. You need to follow up with your health care provider for further evaluation. °CAUSES  °· Heartburn. °· Pneumonia or bronchitis. °· Anxiety or stress. °· Inflammation around your heart (pericarditis) or lung (pleuritis or pleurisy). °· A blood clot in the lung. °· A collapsed lung (pneumothorax). It can develop suddenly on its own (spontaneous pneumothorax) or from trauma to the chest. °· Shingles infection (herpes zoster virus). °The chest wall is composed of bones, muscles, and cartilage. Any of these can be the source of the pain. °· The bones can be bruised by injury. °· The muscles or cartilage can be strained by coughing or overwork. °· The cartilage can be affected by inflammation and become sore (costochondritis). °DIAGNOSIS  °Lab tests or other studies may be needed to find the cause of your pain. Your health care provider may have you take a test called an ambulatory electrocardiogram (ECG). An ECG records your heartbeat patterns over a 24-hour period. You may also have other tests, such as: °· Transthoracic echocardiogram (TTE). During echocardiography, sound waves are used to evaluate how blood flows through your heart. °· Transesophageal echocardiogram (TEE). °· Cardiac monitoring. This allows your health care provider to monitor your heart rate and rhythm in real time. °· Holter monitor. This is a portable device that records your heartbeat and can help diagnose heart arrhythmias. It allows your health care provider to track your heart activity for several days, if needed. °· Stress tests by   exercise or by giving medicine that makes the heart beat faster. °TREATMENT  °· Treatment depends on what may be causing your chest pain. Treatment may include: °¨ Acid blockers for heartburn. °¨ Anti-inflammatory medicine. °¨ Pain medicine for inflammatory conditions. °¨ Antibiotics if an infection is present. °· You may be advised to change lifestyle habits. This includes stopping smoking and  avoiding alcohol, caffeine, and chocolate. °· You may be advised to keep your head raised (elevated) when sleeping. This reduces the chance of acid going backward from your stomach into your esophagus. °Most of the time, nonspecific chest pain will improve within 2-3 days with rest and mild pain medicine.  °HOME CARE INSTRUCTIONS  °· If antibiotics were prescribed, take them as directed. Finish them even if you start to feel better. °· For the next few days, avoid physical activities that bring on chest pain. Continue physical activities as directed. °· Do not use any tobacco products, including cigarettes, chewing tobacco, or electronic cigarettes. °· Avoid drinking alcohol. °· Only take medicine as directed by your health care provider. °· Follow your health care provider's suggestions for further testing if your chest pain does not go away. °· Keep any follow-up appointments you made. If you do not go to an appointment, you could develop lasting (chronic) problems with pain. If there is any problem keeping an appointment, call to reschedule. °SEEK MEDICAL CARE IF:  °· Your chest pain does not go away, even after treatment. °· You have a rash with blisters on your chest. °· You have a fever. °SEEK IMMEDIATE MEDICAL CARE IF:  °· You have increased chest pain or pain that spreads to your arm, neck, jaw, back, or abdomen. °· You have shortness of breath. °· You have an increasing cough, or you cough up blood. °· You have severe back or abdominal pain. °· You feel nauseous or vomit. °· You have severe weakness. °· You faint. °· You have chills. °This is an emergency. Do not wait to see if the pain will go away. Get medical help at once. Call your local emergency services (911 in [place] Do not drive yourself to the hospital. °MAKE SURE YOU:  °· Understand these instructions. °· Will watch your condition. °· Will get help right away if you are not doing well or get worse. °Document Released: 10/23/2004 Document Revised:  01/18/2013 Document Reviewed: 08/19/2007 °[employer]® Patient Information ©2015 [employer]. This information is not intended to replace advice given to you by your health care provider. Make sure you discuss any questions you have with your health care provider. ° °

## 2014-07-24 LAB — GC/CHLAMYDIA PROBE AMP (~~LOC~~) NOT AT ARMC
Chlamydia: NEGATIVE
Neisseria Gonorrhea: NEGATIVE

## 2014-08-02 ENCOUNTER — Ambulatory Visit
Admission: RE | Admit: 2014-08-02 | Discharge: 2014-08-02 | Disposition: A | Discharge status: Home / Self Care | Payer: Worker's Compensation | Source: Ambulatory Visit | Attending: Family Medicine | Admitting: Family Medicine

## 2014-08-02 ENCOUNTER — Other Ambulatory Visit: Payer: Self-pay | Admitting: Family Medicine

## 2014-08-02 DIAGNOSIS — S4992XA Unspecified injury of left shoulder and upper arm, initial encounter: Secondary | ICD-10-CM

## 2014-11-12 ENCOUNTER — Emergency Department (HOSPITAL_BASED_OUTPATIENT_CLINIC_OR_DEPARTMENT_OTHER)
Admission: EM | Admit: 2014-11-12 | Discharge: 2014-11-12 | Disposition: A | Discharge status: Home / Self Care | Payer: Commercial Managed Care - HMO | Attending: Emergency Medicine | Admitting: Emergency Medicine

## 2014-11-12 ENCOUNTER — Emergency Department (HOSPITAL_BASED_OUTPATIENT_CLINIC_OR_DEPARTMENT_OTHER): Payer: Commercial Managed Care - HMO

## 2014-11-12 ENCOUNTER — Encounter (HOSPITAL_BASED_OUTPATIENT_CLINIC_OR_DEPARTMENT_OTHER): Payer: Self-pay | Admitting: Emergency Medicine

## 2014-11-12 DIAGNOSIS — W230XXA Caught, crushed, jammed, or pinched between moving objects, initial encounter: Secondary | ICD-10-CM | POA: Insufficient documentation

## 2014-11-12 DIAGNOSIS — S63639A Sprain of interphalangeal joint of unspecified finger, initial encounter: Secondary | ICD-10-CM

## 2014-11-12 DIAGNOSIS — Z79899 Other long term (current) drug therapy: Secondary | ICD-10-CM | POA: Diagnosis not present

## 2014-11-12 DIAGNOSIS — Z8619 Personal history of other infectious and parasitic diseases: Secondary | ICD-10-CM | POA: Diagnosis not present

## 2014-11-12 DIAGNOSIS — Y9389 Activity, other specified: Secondary | ICD-10-CM | POA: Diagnosis not present

## 2014-11-12 DIAGNOSIS — Y9289 Other specified places as the place of occurrence of the external cause: Secondary | ICD-10-CM | POA: Insufficient documentation

## 2014-11-12 DIAGNOSIS — Y998 Other external cause status: Secondary | ICD-10-CM | POA: Diagnosis not present

## 2014-11-12 DIAGNOSIS — S6991XA Unspecified injury of right wrist, hand and finger(s), initial encounter: Secondary | ICD-10-CM | POA: Diagnosis present

## 2014-11-12 DIAGNOSIS — S66801A Unspecified injury of other specified muscles, fascia and tendons at wrist and hand level, right hand, initial encounter: Secondary | ICD-10-CM

## 2014-11-12 DIAGNOSIS — S66106A Unspecified injury of flexor muscle, fascia and tendon of right little finger at wrist and hand level, initial encounter: Secondary | ICD-10-CM | POA: Insufficient documentation

## 2014-11-12 NOTE — ED Notes (Signed)
Pt in c/o injury to R 5th finger. Denies known injury but stated this am the distal end of finger is swollen and discolored. Erythema, purple coloration, and edema noted to finger. Appears more like possible infection than mechanical injury.

## 2014-11-12 NOTE — ED Provider Notes (Signed)
CSN: [id number]     Arrival date & time 11/12/14  1341 History   First MD Initiated Contact with Patient 11/12/14 1409     Chief Complaint  Patient presents with  . Finger Injury     (Consider location/radiation/quality/duration/timing/severity/associated sxs/prior Treatment) HPI 29 year old female, right-hand dominant, who presents with right hand injury. Reports that she may have jammed her right fifth digit while closing her car door 1-2 days ago. She has been having pain and swelling involving that finger. Denies numbness or weakness, but states that she is unable to close her 5th digit into a fist. Denies any other injuries.  Past Medical History  Diagnosis Date  . Herpes    Past Surgical History  Procedure Laterality Date  . Cesarean section     Family History  Problem Relation Age of Onset  . Hypertension Mother    Social History  Substance Use Topics  . Smoking status: Never Smoker   . Smokeless tobacco: None  . Alcohol Use: No   OB History    Gravida Para Term Preterm AB TAB SAB Ectopic Multiple Living   '3 1 1  1 1    1     ' Review of Systems  Constitutional: Negative for fever.  Musculoskeletal: Positive for joint swelling.  Skin: Negative for wound.  Allergic/Immunologic: Negative for immunocompromised state.  Neurological: Negative for weakness and numbness.  Hematological: Does not bruise/bleed easily.      Allergies  Shellfish allergy and Fish allergy  Home Medications   Prior to Admission medications   Medication Sig Start Date End Date Taking? Authorizing Provider  miconazole (MONISTAT 1 COMBINATION PACK) kit Place 1 each vaginally once.    Historical Provider, MD  Prenatal Vit-Fe Fumarate-FA (PRENATAL MULTIVITAMIN) TABS tablet Take 1 tablet by mouth daily at 12 noon.    Historical Provider, MD   BP 122/73 mmHg  Pulse 88  Temp(Src) 98.8 F (37.1 C) (Oral)  Resp 18  Ht '5\' 8"'  (1.727 m)  Wt 332 lb 6.4 oz (150.776 kg)  BMI 50.55 kg/m2  SpO2  96%  LMP 10/23/2014  Breastfeeding? Unknown Physical Exam Physical Exam  Nursing note and vitals reviewed. Constitutional: Well developed, well nourished, non-toxic, and in no acute distress Head: Normocephalic and atraumatic.  Mouth/Throat: Mucous membranes appear moist Neck: Neck supple.  Cardiovascular:+2 radial pulse bilaterally Pulmonary/Chest: Effort normal Abdominal: Soft. Non-distended Musculoskeletal: Focused exam of the right hand reveals mild swelling and tenderness to palpation over and distal to the DIP joint of the fifth  digit. She has full strength and range of motion involving the MCP and PIP of the fifth digit. Able to fully extend her DIP, but not fully able to flex her DIP on the fifth digit. Neurological: Alert,  fluent speech, intact innervation involving the radial, ulnar, and median nerves of the right hand Skin: Skin is warm and dry.  Psychiatric: Cooperative  ED Course  Procedures (including critical care time) Labs Review Labs Reviewed - No data to display  Imaging Review Dg Hand Complete Right  11/12/2014  CLINICAL DATA:  29 year old female the right-sided hand pain and swelling. Redness in the distal aspect of the fifth digit. EXAM: RIGHT HAND - COMPLETE 3+ VIEW COMPARISON:  No priors. FINDINGS: Multiple views of the right hand demonstrate no acute displaced fracture, subluxation, dislocation, or soft tissue abnormality. Specifically, no retained radiopaque foreign body in the soft tissues of the fifth digit. IMPRESSION: No acute radiographic abnormality of the right hand. Electronically Signed  By: Vinnie Langton M.D.   On: 11/12/2014 14:35   I have personally reviewed and evaluated these images and lab results as part of my medical decision-making.   MDM   Final diagnoses:  [place] finger, initial encounter  Injury of flexor tendon of hand, right, initial encounter    29 year old female, right-hand-dominant, who presents with right fifth finger  injury. Well appearing in no acute distress. Seems to have difficulty with flexor mechanism at the level of the DIP joint, concerning for FDP tendon injury. Extremities neurovascularly intact. X-ray reveals no evidence of fracture. Finger splinted and given hand referral for follow-up.     Forde Dandy, MD 11/12/14 [phone]

## 2014-11-12 NOTE — Discharge Instructions (Signed)
You appear to have a tendon injury involving your 5th finger. Keep splint in place. Please call the hand surgeon listed above tomorrow to set up a follow-up appointment this week. Take tylenol and motrin for pain control.   Tendon Injury Tendons are strong, cordlike structures that connect muscle to bone. Tendons are made up of woven fibers, like a rope. A tendon injury is a tear (rupture) of the tendon. The rupture may be partial (only a few of the fibers in your tendon rupture) or complete (your entire tendon ruptures). CAUSES  Tendon injuries can be caused by high-stress activities, such as sports. They also can be caused by a repetitive injury or by a single injury from an excessive, rapid force. SYMPTOMS  Symptoms of tendon injury include pain when you move the joint close to the tendon. Other symptoms are swelling, redness, and warmth. DIAGNOSIS  Tendon injuries often can be diagnosed by physical exam. However, sometimes an X-ray exam or advanced imaging, such as magnetic resonance imaging (MRI), is necessary to determine the extent of the injury. TREATMENT  Partial tendon ruptures often can be treated with immobilization. A splint, bandage, or removable brace usually is used to immobilize the injured tendon. Most injured tendons need to be immobilized for 1-2 months before they are completely healed. Complete tendon ruptures may require surgical reattachment.   This information is not intended to replace advice given to you by your health care provider. Make sure you discuss any questions you have with your health care provider.   Document Released: 02/21/2004 Document Revised: 01/02/2011 Document Reviewed: 04/06/2011 Elsevier Interactive Patient Education [employer].

## 2014-11-28 ENCOUNTER — Other Ambulatory Visit: Payer: Self-pay | Admitting: Obstetrics and Gynecology

## 2014-11-28 LAB — CYTOLOGY - PAP

## 2014-11-29 LAB — CYTOLOGY - PAP

## 2015-01-15 LAB — OB RESULTS CONSOLE ABO/RH: RH Type: POSITIVE

## 2015-01-15 LAB — OB RESULTS CONSOLE PLATELET COUNT: Platelets: 274 10*3/uL

## 2015-01-15 LAB — OB RESULTS CONSOLE RPR: RPR: NONREACTIVE

## 2015-01-15 LAB — OB RESULTS CONSOLE GC/CHLAMYDIA
Chlamydia: NEGATIVE
Gonorrhea: NEGATIVE

## 2015-01-15 LAB — OB RESULTS CONSOLE ANTIBODY SCREEN: Antibody Screen: NEGATIVE

## 2015-01-15 LAB — OB RESULTS CONSOLE HGB/HCT, BLOOD
HCT: 38 %
Hemoglobin: 13.1 g/dL

## 2015-01-15 LAB — OB RESULTS CONSOLE HIV ANTIBODY (ROUTINE TESTING): HIV: NONREACTIVE

## 2015-01-15 LAB — OB RESULTS CONSOLE RUBELLA ANTIBODY, IGM: Rubella: IMMUNE

## 2015-01-15 LAB — OB RESULTS CONSOLE HEPATITIS B SURFACE ANTIGEN: Hepatitis B Surface Ag: NEGATIVE

## 2015-03-16 ENCOUNTER — Encounter: Payer: Self-pay | Admitting: *Deleted

## 2015-03-16 DIAGNOSIS — Z98891 History of uterine scar from previous surgery: Secondary | ICD-10-CM

## 2015-03-16 DIAGNOSIS — Z3490 Encounter for supervision of normal pregnancy, unspecified, unspecified trimester: Secondary | ICD-10-CM

## 2015-04-10 ENCOUNTER — Encounter: Payer: Self-pay | Admitting: Family

## 2015-04-10 ENCOUNTER — Ambulatory Visit (INDEPENDENT_AMBULATORY_CARE_PROVIDER_SITE_OTHER): Payer: Commercial Managed Care - HMO | Admitting: Family

## 2015-04-10 ENCOUNTER — Encounter: Payer: Self-pay | Admitting: *Deleted

## 2015-04-10 VITALS — BP 131/87 | HR 96 | Temp 98.3°F | Wt 339.9 lb

## 2015-04-10 DIAGNOSIS — Z3492 Encounter for supervision of normal pregnancy, unspecified, second trimester: Secondary | ICD-10-CM | POA: Diagnosis not present

## 2015-04-10 DIAGNOSIS — O98312 Other infections with a predominantly sexual mode of transmission complicating pregnancy, second trimester: Secondary | ICD-10-CM

## 2015-04-10 DIAGNOSIS — A6009 Herpesviral infection of other urogenital tract: Secondary | ICD-10-CM

## 2015-04-10 DIAGNOSIS — O98319 Other infections with a predominantly sexual mode of transmission complicating pregnancy, unspecified trimester: Secondary | ICD-10-CM

## 2015-04-10 DIAGNOSIS — O34219 Maternal care for unspecified type scar from previous cesarean delivery: Secondary | ICD-10-CM

## 2015-04-10 LAB — POCT URINALYSIS DIP (DEVICE)
Bilirubin Urine: NEGATIVE
Glucose, UA: NEGATIVE mg/dL
Hgb urine dipstick: NEGATIVE
Ketones, ur: NEGATIVE mg/dL
Leukocytes, UA: NEGATIVE
Nitrite: NEGATIVE
Protein, ur: NEGATIVE mg/dL
Specific Gravity, Urine: 1.02 (ref 1.005–1.030)
Urobilinogen, UA: 1 mg/dL (ref 0.0–1.0)
pH: 7 (ref 5.0–8.0)

## 2015-04-10 NOTE — Patient Instructions (Signed)
Second Trimester of Pregnancy The second trimester is from week 13 through week 28, months 4 through 6. The second trimester is often a time when you feel your best. Your body has also adjusted to being pregnant, and you begin to feel better physically. Usually, morning sickness has lessened or quit completely, you may have more energy, and you may have an increase in appetite. The second trimester is also a time when the fetus is growing rapidly. At the end of the sixth month, the fetus is about 9 inches long and weighs about 1 pounds. You will likely begin to feel the baby move (quickening) between 18 and 20 weeks of the pregnancy. BODY CHANGES Your body goes through many changes during pregnancy. The changes vary from woman to woman.   Your weight will continue to increase. You will notice your lower abdomen bulging out.  You may begin to get stretch marks on your hips, abdomen, and breasts.  You may develop headaches that can be relieved by medicines approved by your health care provider.  You may urinate more often because the fetus is pressing on your bladder.  You may develop or continue to have heartburn as a result of your pregnancy.  You may develop constipation because certain hormones are causing the muscles that push waste through your intestines to slow down.  You may develop hemorrhoids or swollen, bulging veins (varicose veins).  You may have back pain because of the weight gain and pregnancy hormones relaxing your joints between the bones in your pelvis and as a result of a shift in weight and the muscles that support your balance.  Your breasts will continue to grow and be tender.  Your gums may bleed and may be sensitive to brushing and flossing.  Dark spots or blotches (chloasma, mask of pregnancy) may develop on your face. This will likely fade after the baby is born.  A dark line from your belly button to the pubic area (linea nigra) may appear. This will likely  fade after the baby is born.  You may have changes in your hair. These can include thickening of your hair, rapid growth, and changes in texture. Some women also have hair loss during or after pregnancy, or hair that feels dry or thin. Your hair will most likely return to normal after your baby is born. WHAT TO EXPECT AT YOUR PRENATAL VISITS During a routine prenatal visit:  You will be weighed to make sure you and the fetus are growing normally.  Your blood pressure will be taken.  Your abdomen will be measured to track your baby's growth.  The fetal heartbeat will be listened to.  Any test results from the previous visit will be discussed. Your health care provider may ask you:  How you are feeling.  If you are feeling the baby move.  If you have had any abnormal symptoms, such as leaking fluid, bleeding, severe headaches, or abdominal cramping.  If you are using any tobacco products, including cigarettes, chewing tobacco, and electronic cigarettes.  If you have any questions. Other tests that may be performed during your second trimester include:  Blood tests that check for:  Low iron levels (anemia).  Gestational diabetes (between 24 and 28 weeks).  Rh antibodies.  Urine tests to check for infections, diabetes, or protein in the urine.  An ultrasound to confirm the proper growth and development of the baby.  An amniocentesis to check for possible genetic problems.  Fetal screens for spina bifida   and Down syndrome.  HIV (human immunodeficiency virus) testing. Routine prenatal testing includes screening for HIV, unless you choose not to have this test. HOME CARE INSTRUCTIONS   Avoid all smoking, herbs, alcohol, and unprescribed drugs. These chemicals affect the formation and growth of the baby.  Do not use any tobacco products, including cigarettes, chewing tobacco, and electronic cigarettes. If you need help quitting, ask your health care provider. You may receive  counseling support and other resources to help you quit.  Follow your health care provider's instructions regarding medicine use. There are medicines that are either safe or unsafe to take during pregnancy.  Exercise only as directed by your health care provider. Experiencing uterine cramps is a good sign to stop exercising.  Continue to eat regular, healthy meals.  Wear a good support bra for breast tenderness.  Do not use hot tubs, steam rooms, or saunas.  Wear your seat belt at all times when driving.  Avoid raw meat, uncooked cheese, cat litter boxes, and soil used by cats. These carry germs that can cause birth defects in the baby.  Take your prenatal vitamins.  Take 1500-2000 mg of calcium daily starting at the 20th week of pregnancy until you deliver your baby.  Try taking a stool softener (if your health care provider approves) if you develop constipation. Eat more high-fiber foods, such as fresh vegetables or fruit and whole grains. Drink plenty of fluids to keep your urine clear or pale yellow.  Take warm sitz baths to soothe any pain or discomfort caused by hemorrhoids. Use hemorrhoid cream if your health care provider approves.  If you develop varicose veins, wear support hose. Elevate your feet for 15 minutes, 3-4 times a day. Limit salt in your diet.  Avoid heavy lifting, wear low heel shoes, and practice good posture.  Rest with your legs elevated if you have leg cramps or low back pain.  Visit your dentist if you have not gone yet during your pregnancy. Use a soft toothbrush to brush your teeth and be gentle when you floss.  A sexual relationship may be continued unless your health care provider directs you otherwise.  Continue to go to all your prenatal visits as directed by your health care provider. SEEK MEDICAL CARE IF:   You have dizziness.  You have mild pelvic cramps, pelvic pressure, or nagging pain in the abdominal area.  You have persistent nausea,  vomiting, or diarrhea.  You have a bad smelling vaginal discharge.  You have pain with urination. SEEK IMMEDIATE MEDICAL CARE IF:   You have a fever.  You are leaking fluid from your vagina.  You have spotting or bleeding from your vagina.  You have severe abdominal cramping or pain.  You have rapid weight gain or loss.  You have shortness of breath with chest pain.  You notice sudden or extreme swelling of your face, hands, ankles, feet, or legs.  You have not felt your baby move in over an hour.  You have severe headaches that do not go away with medicine.  You have vision changes.   This information is not intended to replace advice given to you by your health care provider. Make sure you discuss any questions you have with your health care provider.   Document Released: 01/07/2001 Document Revised: 02/03/2014 Document Reviewed: 03/16/2012 Elsevier Interactive Patient Education 2016 [employer].  Trial of Labor After Cesarean Delivery Information A trial of labor after cesarean delivery (TOLAC) is when a woman tries to  give birth vaginally after a previous cesarean delivery. TOLAC may be a safe and appropriate option for you depending on your medical history and other risk factors. When TOLAC is successful and you are able to have a vaginal delivery, this is called a vaginal birth after cesarean delivery (VBAC).  CANDIDATES FOR TOLAC TOLAC is possible for some women who:  Have undergone one or two prior cesarean deliveries in which the incision of the uterus was horizontal (low transverse).  Are carrying twins and have had one prior low transverse incision during a cesarean delivery.  Do not have a vertical (classical) uterine scar.  Have not had a tear in the wall of their uterus (uterine rupture). TOLAC is also supported for women who meet appropriate criteria and:  Are under the age of 52 years.  Are tall and have a body mass index (BMI) of less than  30.  Have an unknown uterine scar.  Give birth in a facility equipped to handle an emergency cesarean delivery. This team should be able to handle possible complications such as a uterine rupture.  Have thorough counseling about the benefits and risks of TOLAC.  Have discussed future pregnancy plans with their health care provider.  Plan to have several more pregnancies. MOST SUCCESSFUL CANDIDATES FOR TOLAC:  Have had a successful vaginal delivery before or after their cesarean delivery.  Experience labor that begins naturally on or before the due date (40 weeks of gestation).  Do not have a very large (macrosomic) baby.   Had a prior cesarean delivery but are not currently experiencing factors that would prompt a cesarean delivery (such as a breech position).  Had only one prior cesarean delivery.  Had a prior cesarean delivery that was performed early in labor and not after full cervical dilation. TOLAC may be most appropriate for women who meet the above guidelines and who plan to have more pregnancies. TOLAC is not recommended for home births. LEAST SUCCESSFUL CANDIDATES FOR TOLAC:  Have an induced labor with an unfavorable cervix. An unfavorable cervix is when the cervix is not dilating enough (among other factors).  Have never had a vaginal delivery.  Have had more than two cesarean deliveries.  Have a pregnancy at more than 40 weeks of gestation.  Are pregnant with a baby with a suspected weight greater than 4,000 grams (8 pounds) and who have no prior history of a vaginal delivery.  Have closely spaced pregnancies. SUGGESTED BENEFITS OF TOLAC  You may have a faster recovery time.  You may have a shorter stay in the hospital.  You may have less pain and fewer problems than with a cesarean delivery. Women who have a cesarean delivery have a higher chance of needing blood or getting a fever, an infection, or a blood clot in the legs. SUGGESTED RISKS OF TOLAC The  highest risk of complications happens to women who attempt a TOLAC and fail. A failed TOLAC results in an unplanned cesarean delivery. Risks related to [facility] or repeat cesarean deliveries include:   Blood loss.  Infection.  Blood clot.  Injury to surrounding tissues or organs.  Having to remove the uterus (hysterectomy).  Potential problems with the placenta (such as placenta previa or placenta accreta) in future pregnancies. Although very rare, the main concerns with TOLAC are:  Rupture of the uterine scar from a past cesarean delivery.  Needing an emergency cesarean delivery.  Having a bad outcome for the baby (perinatal morbidity). FOR MORE INFORMATION American [employer] of Obstetricians  and Gynecologists: www.acog.[place]: www.midwife.org   This information is not intended to replace advice given to you by your health care provider. Make sure you discuss any questions you have with your health care provider.   Document Released: 10/01/2010 Document Revised: 11/03/2012 Document Reviewed: 07/05/2012 Elsevier Interactive Patient Education [employer].

## 2015-04-10 NOTE — Progress Notes (Signed)
Subjective:    Kristin Love is a [id number] [redacted]w[redacted]d being seen today for her first obstetrical visit.  Pt is transferring from [facility].  Her obstetrical history is significant for obesity and history of prior csection. Patient does intend to breast feed. Pregnancy history fully reviewed.  Patient reports no complaints.  Filed Vitals:   04/10/15 0858  BP: 131/87  Pulse: 96  Temp: 98.3 F (36.8 C)  Weight: 339 lb 14.4 oz (154.178 kg)    HISTORY: OB History  Gravida Para Term Preterm AB SAB TAB Ectopic Multiple Living  4 1 1  0 2 0 2 0 0 1    # Outcome Date GA Lbr Len/2nd Weight Sex Delivery Anes PTL Lv  4 Current           3 TAB 2016          2 Term 03/01/08 [redacted]w[redacted]d  9 lb 7 oz (4.281 kg) M CS-[id number]  N      Comments: c/s - thinks for long labor  1 TAB 2007             Past Medical History  Diagnosis Date  . Herpes   . Vaginal Pap smear, abnormal     had colposcopy repeat pap negative   Past Surgical History  Procedure Laterality Date  . Cesarean section     Family History  Problem Relation Age of Onset  . Hypertension Mother   . Hypertension Father   . Diabetes Father      Exam   Subjective:  Kristin Love is a 30 y.o. G4P1021 at [redacted]w[redacted]d being seen today for ongoing prenatal care.  She is currently monitored for the following issues for this high-risk pregnancy and has OBESITY, NOS; MIGRAINE, UNSPEC., W/O INTRACTABLE MIGRAINE; RHINITIS, ALLERGIC; ACNE; INSOMNIA NOS; Supervision of low-risk pregnancy; History of C-section; Previous cesarean delivery affecting pregnancy, antepartum; and Genital herpes affecting pregnancy, antepartum on her problem list.  Patient reports no complaints.  Contractions: Not present. Vag. Bleeding: None.  Movement: Present. Denies leaking of fluid.   The following portions of the patient's history were reviewed and updated as appropriate: allergies, current medications, past family history, past medical history, past social history, past  surgical history and problem list. Problem list updated.  Objective:   Filed Vitals:   04/10/15 0858  BP: 131/87  Pulse: 96  Temp: 98.3 F (36.8 C)  Weight: 339 lb 14.4 oz (154.178 kg)    Fetal Status: Fetal Heart Rate (bpm): 140 Fundal Height: 26 cm Movement: Present     General:  Alert, oriented and cooperative. Patient is in no acute distress.  Skin: Skin is warm and dry. No rash noted.   Cardiovascular: Normal heart rate noted  Respiratory: Normal respiratory effort, no problems with respiration noted  Abdomen: Soft, gravid, appropriate for gestational age. Pain/Pressure: Present     Pelvic: Vag. Bleeding: None     Cervical exam deferred        Extremities: Normal range of motion.  Edema: None  Mental Status: Normal mood and affect. Normal behavior. Normal judgment and thought content.   Urinalysis: Urine Protein: Negative Urine Glucose: Negative  Assessment and Plan:  Pregnancy: G4P1021 at [redacted]w[redacted]d  1. Previous cesarean delivery affecting pregnancy, antepartum - Pt reviewed consent and desires repeat > consent signed  2. Genital herpes affecting pregnancy, antepartum - valACYclovir (VALTREX) 500 MG tablet; Take 500 mg by mouth 2 (two) times daily as needed.  3. Supervision of low-risk pregnancy, second trimester -  POCT urinalysis dip (device) - Prescript Monitor Profile(19) - Obtain ultrasound record > difficult to read in prenatal record (all black)    Reviewed prenatal records. Prenatal vitamins. Problem list reviewed and updated. Third trimester labs at next visit.  Follow up in 4 weeks Preterm labor symptoms and general obstetric precautions including but not limited to vaginal bleeding, contractions, leaking of fluid and fetal movement were reviewed in detail with the patient. Please refer to After Visit Summary for other counseling recommendations.  Return in about 4 weeks (around 05/08/2015).   Venia Carbon Michiel Cowboy, CNM

## 2015-04-10 NOTE — Progress Notes (Signed)
New patient education packet given. Declines flu shot.

## 2015-04-12 LAB — PRESCRIPTION MONITORING PROFILE (19 PANEL)
Amphetamine/Meth: NEGATIVE ng/mL
Barbiturate Screen, Urine: NEGATIVE ng/mL
Benzodiazepine Screen, Urine: NEGATIVE ng/mL
Buprenorphine, Urine: NEGATIVE ng/mL
Cannabinoid Scrn, Ur: NEGATIVE ng/mL
Carisoprodol, Urine: NEGATIVE ng/mL
Cocaine Metabolites: NEGATIVE ng/mL
Creatinine, Urine: 105.37 mg/dL (ref >20.0)
Fentanyl, Ur: NEGATIVE ng/mL
MDMA URINE: NEGATIVE ng/mL
Meperidine, Ur: NEGATIVE ng/mL
Methadone Screen, Urine: NEGATIVE ng/mL
Methaqualone: NEGATIVE ng/mL
Nitrites, Initial: NEGATIVE ug/mL
Opiate Screen, Urine: NEGATIVE ng/mL
Oxycodone Screen, Ur: NEGATIVE ng/mL
Phencyclidine, Ur: NEGATIVE ng/mL
Propoxyphene: NEGATIVE ng/mL
Tapentadol, urine: NEGATIVE ng/mL
Tramadol Scrn, Ur: NEGATIVE ng/mL
Zolpidem, Urine: NEGATIVE ng/mL
pH, Initial: 7.4 pH (ref 4.5–8.9)

## 2015-05-08 ENCOUNTER — Ambulatory Visit (INDEPENDENT_AMBULATORY_CARE_PROVIDER_SITE_OTHER): Payer: Commercial Managed Care - HMO | Admitting: Family

## 2015-05-08 VITALS — BP 125/72 | HR 101 | Temp 98.0°F | Wt 349.2 lb

## 2015-05-08 DIAGNOSIS — O34219 Maternal care for unspecified type scar from previous cesarean delivery: Secondary | ICD-10-CM

## 2015-05-08 DIAGNOSIS — Z3493 Encounter for supervision of normal pregnancy, unspecified, third trimester: Secondary | ICD-10-CM

## 2015-05-08 DIAGNOSIS — Z23 Encounter for immunization: Secondary | ICD-10-CM | POA: Diagnosis not present

## 2015-05-08 LAB — CBC
HCT: 34.9 % — ABNORMAL LOW (ref 35.0–45.0)
Hemoglobin: 11.7 g/dL (ref 11.7–15.5)
MCH: 28.5 pg (ref 27.0–33.0)
MCHC: 33.5 g/dL (ref 32.0–36.0)
MCV: 84.9 fL (ref 80.0–100.0)
MPV: 9.7 fL (ref 7.5–12.5)
Platelets: 223 10*3/uL (ref 140–400)
RBC: 4.11 MIL/uL (ref 3.80–5.10)
RDW: 13.9 % (ref 11.0–15.0)
WBC: 9.5 10*3/uL (ref 3.8–10.8)

## 2015-05-08 LAB — POCT URINALYSIS DIP (DEVICE)
Bilirubin Urine: NEGATIVE
Glucose, UA: NEGATIVE mg/dL
Hgb urine dipstick: NEGATIVE
Ketones, ur: NEGATIVE mg/dL
Leukocytes, UA: NEGATIVE
Nitrite: NEGATIVE
Protein, ur: 30 mg/dL — AB
Specific Gravity, Urine: 1.025 (ref 1.005–1.030)
Urobilinogen, UA: 2 mg/dL — ABNORMAL HIGH (ref 0.0–1.0)
pH: 7 (ref 5.0–8.0)

## 2015-05-08 MED ORDER — TETANUS-DIPHTH-ACELL PERTUSSIS 5-2.5-18.5 LF-MCG/0.5 IM SUSP
0.5000 mL | Freq: Once | INTRAMUSCULAR | Status: AC
  Administered 2015-05-08: 0.5 mL via INTRAMUSCULAR

## 2015-05-08 NOTE — Progress Notes (Signed)
Subjective:  Kala Pilat is a 30 y.o. G4P1021 at [redacted]w[redacted]d being seen today for ongoing prenatal care.  She is currently monitored for the following issues for this low-risk pregnancy and has OBESITY, NOS; MIGRAINE, UNSPEC., W/O INTRACTABLE MIGRAINE; RHINITIS, ALLERGIC; ACNE; INSOMNIA NOS; Supervision of low-risk pregnancy; History of C-section; Previous cesarean delivery affecting pregnancy, antepartum; and Genital herpes affecting pregnancy, antepartum on her problem list.  Patient reports no complaints.  Contractions: Not present. Vag. Bleeding: None.  Movement: Present. Denies leaking of fluid.   The following portions of the patient's history were reviewed and updated as appropriate: allergies, current medications, past family history, past medical history, past social history, past surgical history and problem list. Problem list updated.  Objective:   Filed Vitals:   05/08/15 0817  BP: 125/72  Pulse: 101  Temp: 98 F (36.7 C)  Weight: 349 lb 3.2 oz (158.396 kg)    Fetal Status: Fetal Heart Rate (bpm): 144 Fundal Height: 29 cm Movement: Present     General:  Alert, oriented and cooperative. Patient is in no acute distress.  Skin: Skin is warm and dry. No rash noted.   Cardiovascular: Normal heart rate noted  Respiratory: Normal respiratory effort, no problems with respiration noted  Abdomen: Soft, gravid, appropriate for gestational age. Pain/Pressure: Present     Pelvic: Vag. Bleeding: None     Cervical exam deferred        Extremities: Normal range of motion.  Edema: None  Mental Status: Normal mood and affect. Normal behavior. Normal judgment and thought content.   Urinalysis: Urine Protein: 1+ Urine Glucose: Negative  Assessment and Plan:  Pregnancy: G4P1021 at [redacted]w[redacted]d  1. Need for Tdap vaccination - Tdap (BOOSTRIX) injection 0.5 mL; Inject 0.5 mLs into the muscle once.  2. Supervision of low-risk pregnancy, third trimester - Glucose Tolerance, 1 HR (50g) w/o Fasting -  HIV antibody (with reflex) - CBC - RPR  3. Previous cesarean delivery affecting pregnancy, antepartum - Desires to have it on 6/28 if possible (39.2); [birth year] her [birth year] and next year she's turning 58!  Preterm labor symptoms and general obstetric precautions including but not limited to vaginal bleeding, contractions, leaking of fluid and fetal movement were reviewed in detail with the patient. Please refer to After Visit Summary for other counseling recommendations.  Return in about 2 weeks (around 05/22/2015).   Venia Carbon Michiel Cowboy, CNM

## 2015-05-08 NOTE — Progress Notes (Signed)
Breastfeeding tip of the week reviewed 28 week education packet given 28 week labs today Tdap today

## 2015-05-09 ENCOUNTER — Inpatient Hospital Stay (HOSPITAL_COMMUNITY)
Admission: AD | Admit: 2015-05-09 | Discharge: 2015-05-09 | Disposition: A | Discharge status: Home / Self Care | Payer: Commercial Managed Care - HMO | Source: Ambulatory Visit | Attending: Family Medicine | Admitting: Family Medicine

## 2015-05-09 ENCOUNTER — Telehealth: Payer: Self-pay | Admitting: Advanced Practice Midwife

## 2015-05-09 ENCOUNTER — Encounter (HOSPITAL_COMMUNITY): Payer: Self-pay | Admitting: *Deleted

## 2015-05-09 DIAGNOSIS — B9689 Other specified bacterial agents as the cause of diseases classified elsewhere: Secondary | ICD-10-CM | POA: Diagnosis not present

## 2015-05-09 DIAGNOSIS — N760 Acute vaginitis: Secondary | ICD-10-CM | POA: Diagnosis not present

## 2015-05-09 DIAGNOSIS — R002 Palpitations: Secondary | ICD-10-CM | POA: Diagnosis not present

## 2015-05-09 DIAGNOSIS — A499 Bacterial infection, unspecified: Secondary | ICD-10-CM | POA: Diagnosis not present

## 2015-05-09 DIAGNOSIS — R42 Dizziness and giddiness: Secondary | ICD-10-CM | POA: Diagnosis not present

## 2015-05-09 DIAGNOSIS — O23593 Infection of other part of genital tract in pregnancy, third trimester: Secondary | ICD-10-CM

## 2015-05-09 DIAGNOSIS — O26893 Other specified pregnancy related conditions, third trimester: Secondary | ICD-10-CM | POA: Diagnosis not present

## 2015-05-09 DIAGNOSIS — Z3A28 28 weeks gestation of pregnancy: Secondary | ICD-10-CM

## 2015-05-09 LAB — RPR

## 2015-05-09 LAB — WET PREP, GENITAL
Sperm: NONE SEEN
Trich, Wet Prep: NONE SEEN
Yeast Wet Prep HPF POC: NONE SEEN

## 2015-05-09 LAB — URINALYSIS, ROUTINE W REFLEX MICROSCOPIC
Bilirubin Urine: NEGATIVE
Glucose, UA: NEGATIVE mg/dL
Hgb urine dipstick: NEGATIVE
Ketones, ur: NEGATIVE mg/dL
Leukocytes, UA: NEGATIVE
Nitrite: NEGATIVE
Protein, ur: NEGATIVE mg/dL
Specific Gravity, Urine: <1.005 — ABNORMAL LOW (ref 1.005–1.030)
pH: 6.5 (ref 5.0–8.0)

## 2015-05-09 LAB — GLUCOSE TOLERANCE, 1 HOUR (50G) W/O FASTING: Glucose, 1 Hr, gestational: 106 mg/dL (ref <140)

## 2015-05-09 LAB — HIV ANTIBODY (ROUTINE TESTING W REFLEX): HIV 1&2 Ab, 4th Generation: NONREACTIVE

## 2015-05-09 MED ORDER — METRONIDAZOLE 500 MG PO TABS: 500.0000 mg | ORAL_TABLET | Freq: Two times a day (BID) | ORAL | Status: DC

## 2015-05-09 NOTE — MAU Provider Note (Signed)
History     CSN: [id number]  Arrival date and time: 05/09/15 1034     Chief Complaint  Patient presents with  . Dizziness   HPI  This is a 30 year old G4P1021 at [redacted]w[redacted]d who presents with dizziness and racing heart that started yesterday after her 1 hour glucola test. She initially felt dizzy and had her blood pressure and HR checked at the [facility] office, both were normal. She checked her blood sugar at work later that evening at it was 98. This morning, at 8:45am she was at her son's school and she started to feel dizzy again. She was standing at the time. She also could "see her heart beating in her chest". She sat down and felt better. She then drove to work and had her vitals checked. BP in the 170s, HR 125. She then came to the MAU. This has never happened before. She ate breakfast and drank plenty of water this morning. She states she always eats and drinks. No new medications.  She denies vaginal bleeding or leakage of fluids. She endorses fetal movement. No contractions.  She endorses an increase in vaginal discharge. She is unsure if she has had a change in discharge color. She states the discharge has a "slight odor". No vaginal itchiness. She denies headaches, blurred vision, nausea, vomiting, or diarrhea.   Past Medical History  Diagnosis Date  . Herpes   . Vaginal Pap smear, abnormal     had colposcopy repeat pap negative    Past Surgical History  Procedure Laterality Date  . Cesarean section      Family History  Problem Relation Age of Onset  . Hypertension Mother   . Hypertension Father   . Diabetes Father     Social History  Substance Use Topics  . Smoking status: Never Smoker   . Smokeless tobacco: Never Used  . Alcohol Use: No    Allergies:  Allergies  Allergen Reactions  . Shellfish Allergy Anaphylaxis  . Fish Allergy Swelling    Raw shrimp    Prescriptions prior to admission  Medication Sig Dispense Refill Last Dose  . Prenatal Vit-Fe Fumarate-FA  (PRENATAL MULTIVITAMIN) TABS tablet Take 1 tablet by mouth daily at 12 noon.   05/08/2015 at Unknown time    Review of Systems  Constitutional: Negative for fever and chills.  Eyes: Negative for blurred vision.  Respiratory: Negative for shortness of breath.   Cardiovascular: Positive for palpitations. Negative for chest pain.  Gastrointestinal: Negative for heartburn, nausea, vomiting, abdominal pain and diarrhea.  Genitourinary: Negative for dysuria.  Musculoskeletal: Negative for myalgias.  Skin: Negative for rash.  Neurological: Positive for dizziness. Negative for weakness and headaches.  Endo/Heme/Allergies: Negative for environmental allergies.   Physical Exam   Blood pressure 128/96, pulse 116, last menstrual period 10/23/2014, SpO2 98 %, unknown if currently breastfeeding.  Physical Exam  Nursing note and vitals reviewed. Constitutional: She is oriented to person, place, and time. She appears well-developed and well-nourished. No distress.  Eyes: No scleral icterus.  Neck: Normal range of motion.  Cardiovascular: Normal rate and regular rhythm.   No murmur heard. Respiratory: Effort normal and breath sounds normal.  GI: Soft. There is no tenderness. There is no rebound and no guarding.  gravid  Musculoskeletal: Normal range of motion.  Neurological: She is alert and oriented to person, place, and time.  Skin: Skin is warm and dry. No rash noted.    MAU Course  Procedures  MDM This is a  30 year old G4P1021 at [redacted]w[redacted]d presenting with dizziness and palpitations. Orthostatics performed in the MAU were positive, as HR increased from 114 to 152. Differentials include physiological orthostatic hypotension of pregnancy. She could also be dehydrated, however she has not had any vomiting or diarrhea and states she drinks plenty of water throughout the day. We considered a possibility of anemia, but her Hgb was just checked on 4/11 and was 11.7. She could also have a cardiac  arrhythmia such as SVT or pAfib, as her mother also has palpitations; however she has a regular rate and rhythm on exam.  Assessment and Plan  #Palpitations: - EKG performed in the MAU was normal. There is a P wave before every QRS. Normal sinus rhythm. - Pt advised to drink plenty of fluids and sit and stand up slowly. - Continue routine prenatal care  #Bacterial vaginosis: - Wet prep positive for clue cells. Will treat with Flagyl 500mg  bid x 7 days. - GC/Chlamydia ordered.  Berna Spare Mayo 05/09/2015, 12:23 PM   OB fellow attestation: I have seen and examined this patient; I agree with above documentation in the resident's note.   Kristin Love is a 30 y.o. [phone] reporting racing heart +FM, denies LOF, VB, contractions, vaginal discharge.  PE: BP 112/56 mmHg  Pulse 116  Resp 16  SpO2 98%  LMP 10/23/2014 Gen: calm comfortable, NAD Resp: normal effort, no distress Abd: gravid  ROS, labs, PMH reviewed NST reassuring for gestational age.   Plan: - Palpitations: EKG wnl. Agree with increased fluid intake - BV- treat per above - Fetal well being- NST is appropriate for gestational age.   Caren Macadam, MD , MPH, [facility], OB Fellow [place]  Attending physician: Darron Doom MD

## 2015-05-09 NOTE — Discharge Instructions (Signed)

## 2015-05-09 NOTE — Telephone Encounter (Signed)
Patient called and said her Heart was beating Really fast, I spoke to [place] and she recommended that the patient go straight to MAU.

## 2015-05-09 NOTE — MAU Note (Signed)
Pt presents to MAU via EMS for complaints of feeling dizzy and blurred vision today. PT denies any vaginal bleeding or abnormal discharge.

## 2015-05-10 LAB — GC/CHLAMYDIA PROBE AMP (~~LOC~~) NOT AT ARMC
Chlamydia: NEGATIVE
Neisseria Gonorrhea: NEGATIVE

## 2015-05-16 ENCOUNTER — Inpatient Hospital Stay (HOSPITAL_COMMUNITY)
Admission: AD | Admit: 2015-05-16 | Discharge: 2015-05-16 | Disposition: A | Discharge status: Home / Self Care | Payer: Commercial Managed Care - HMO | Source: Ambulatory Visit | Attending: Obstetrics & Gynecology | Admitting: Obstetrics & Gynecology

## 2015-05-16 ENCOUNTER — Encounter (HOSPITAL_COMMUNITY): Payer: Self-pay | Admitting: *Deleted

## 2015-05-16 ENCOUNTER — Inpatient Hospital Stay (HOSPITAL_COMMUNITY): Payer: Commercial Managed Care - HMO

## 2015-05-16 DIAGNOSIS — Z91013 Allergy to seafood: Secondary | ICD-10-CM | POA: Diagnosis not present

## 2015-05-16 DIAGNOSIS — Z3A29 29 weeks gestation of pregnancy: Secondary | ICD-10-CM | POA: Insufficient documentation

## 2015-05-16 DIAGNOSIS — O26893 Other specified pregnancy related conditions, third trimester: Secondary | ICD-10-CM | POA: Diagnosis not present

## 2015-05-16 DIAGNOSIS — A084 Viral intestinal infection, unspecified: Secondary | ICD-10-CM | POA: Diagnosis not present

## 2015-05-16 DIAGNOSIS — R1114 Bilious vomiting: Secondary | ICD-10-CM

## 2015-05-16 DIAGNOSIS — R112 Nausea with vomiting, unspecified: Secondary | ICD-10-CM | POA: Diagnosis present

## 2015-05-16 LAB — COMPREHENSIVE METABOLIC PANEL
ALT: 22 U/L (ref 14–54)
AST: 20 U/L (ref 15–41)
Albumin: 3 g/dL — ABNORMAL LOW (ref 3.5–5.0)
Alkaline Phosphatase: 72 U/L (ref 38–126)
Anion gap: 9 (ref 5–15)
BUN: 5 mg/dL — ABNORMAL LOW (ref 6–20)
CO2: 23 mmol/L (ref 22–32)
Calcium: 8.3 mg/dL — ABNORMAL LOW (ref 8.9–10.3)
Chloride: 104 mmol/L (ref 101–111)
Creatinine, Ser: 0.51 mg/dL (ref 0.44–1.00)
GFR calc Af Amer: >60 mL/min (ref >60)
GFR calc non Af Amer: >60 mL/min (ref >60)
Glucose, Bld: 105 mg/dL — ABNORMAL HIGH (ref 65–99)
Potassium: 3.9 mmol/L (ref 3.5–5.1)
Sodium: 136 mmol/L (ref 135–145)
Total Bilirubin: 0.7 mg/dL (ref 0.3–1.2)
Total Protein: 7.4 g/dL (ref 6.5–8.1)

## 2015-05-16 LAB — URINALYSIS, ROUTINE W REFLEX MICROSCOPIC
Bilirubin Urine: NEGATIVE
Glucose, UA: NEGATIVE mg/dL
Hgb urine dipstick: NEGATIVE
Ketones, ur: 15 mg/dL — AB
Leukocytes, UA: NEGATIVE
Nitrite: NEGATIVE
Protein, ur: 30 mg/dL — AB
Specific Gravity, Urine: 1.02 (ref 1.005–1.030)
pH: 7 (ref 5.0–8.0)

## 2015-05-16 LAB — CBC WITH DIFFERENTIAL/PLATELET
Basophils Absolute: 0 10*3/uL (ref 0.0–0.1)
Basophils Relative: 0 %
Eosinophils Absolute: 0 10*3/uL (ref 0.0–0.7)
Eosinophils Relative: 0 %
HCT: 37.2 % (ref 36.0–46.0)
Hemoglobin: 12.8 g/dL (ref 12.0–15.0)
Lymphocytes Relative: 5 %
Lymphs Abs: 0.5 10*3/uL — ABNORMAL LOW (ref 0.7–4.0)
MCH: 29.1 pg (ref 26.0–34.0)
MCHC: 34.4 g/dL (ref 30.0–36.0)
MCV: 84.5 fL (ref 78.0–100.0)
Monocytes Absolute: 0.3 10*3/uL (ref 0.1–1.0)
Monocytes Relative: 3 %
Neutro Abs: 9.9 10*3/uL — ABNORMAL HIGH (ref 1.7–7.7)
Neutrophils Relative %: 92 %
Platelets: 226 10*3/uL (ref 150–400)
RBC: 4.4 MIL/uL (ref 3.87–5.11)
RDW: 13.9 % (ref 11.5–15.5)
WBC: 10.7 10*3/uL — ABNORMAL HIGH (ref 4.0–10.5)

## 2015-05-16 LAB — URINE MICROSCOPIC-ADD ON
RBC / HPF: NONE SEEN RBC/hpf (ref 0–5)
WBC, UA: NONE SEEN WBC/hpf (ref 0–5)

## 2015-05-16 LAB — LIPASE, BLOOD: Lipase: 16 U/L (ref 11–51)

## 2015-05-16 MED ORDER — PROMETHAZINE HCL 25 MG RE SUPP: 25.0000 mg | Freq: Four times a day (QID) | RECTAL | Status: DC | PRN

## 2015-05-16 MED ORDER — PROMETHAZINE HCL 12.5 MG PO TABS: 25.0000 mg | ORAL_TABLET | Freq: Four times a day (QID) | ORAL | Status: DC | PRN

## 2015-05-16 MED ORDER — ONDANSETRON 4 MG PO TBDP
4.0000 mg | ORAL_TABLET | Freq: Once | ORAL | Status: AC
  Administered 2015-05-16: 4 mg via ORAL
  Filled 2015-05-16: qty 1

## 2015-05-16 NOTE — Discharge Instructions (Signed)

## 2015-05-16 NOTE — MAU Note (Signed)
Patient presents at [redacted] weeks gestation with c/o nausea and vomiting since 0100 today. Fetus active. Denies bleeding or discharge.

## 2015-05-16 NOTE — MAU Provider Note (Signed)
  History     CSN: [id number]  Arrival date and time: 05/16/15 1243   First Provider Initiated Contact with Patient 05/16/15 1356      Chief Complaint  Patient presents with  . Emesis   HPI Ms. Torio is a 30yo [id number] at 29+2 who presented to MAU with nausea/vomiting.  She notes that she felt well yesterday but woke up this morning at 1am feeling nauseated.  She has been vomiting since then, yellowish fluid.  She denies diarrhea.  She has not had fevers but feels intermittently chilled.  She denies dysuria.  Nobody at home is sick.  She is finishing a course of metronidazole for BV but did not take it yesterday.  Past Medical History  Diagnosis Date  . Herpes   . Vaginal Pap smear, abnormal     had colposcopy repeat pap negative    Past Surgical History  Procedure Laterality Date  . Cesarean section      Family History  Problem Relation Age of Onset  . Hypertension Mother   . Hypertension Father   . Diabetes Father     Social History  Substance Use Topics  . Smoking status: Never Smoker   . Smokeless tobacco: Never Used  . Alcohol Use: No    Allergies:  Allergies  Allergen Reactions  . Shellfish Allergy Anaphylaxis  . Fish Allergy Swelling    Raw shrimp    Prescriptions prior to admission  Medication Sig Dispense Refill Last Dose  . metroNIDAZOLE (FLAGYL) 500 MG tablet Take 1 tablet (500 mg total) by mouth 2 (two) times daily. 14 tablet 0   . Prenatal Vit-Fe Fumarate-FA (PRENATAL MULTIVITAMIN) TABS tablet Take 1 tablet by mouth daily at 12 noon.   05/08/2015 at Unknown time    ROS  No headache No sore throat No abdominal pain No rash Physical Exam   Blood pressure 112/78, pulse 108, temperature 97.9 F (36.6 C), temperature source Oral, resp. rate 18, height 5\' 8"  (1.727 m), weight 349 lb 3 oz (158.39 kg), last menstrual period 10/23/2014, unknown if currently breastfeeding.  Physical Exam  Gen: alert 29yo obese, gravid female in NAD HEENT: NCAT,  normal conjunctivae, oral mucosa tacky Chest: normal WOB, lungs CTAB CV: normal rate and regular rhythm, normal S1 and S2, no m/r/g, cap refill < 2 secs Abd: +BS, soft, nontender Ext: no pedal edma Skin: normal turgor Psych: cooperative, appropriate affect  MAU Course  Procedures  MDM Patient presents with nausea/vomiting X 12 hours.  She appears relatively well-hydrated but does have trace ketones in urine.  She had what appeared to be bilious vomiting, so work-up to r/o pancreatitis or obstruction was pursued including CBC, CMP, and abdominal X-ray.  These were negative.  The patient improved after Zofran and was able to tolerate 2 ginger ales prior to discharge.  Assessment and Plan  29yo G4P1021 at 29+1 here with nausea/vomiting most c/w viral gastroenteritis without current evidence of other infection.  Will discharge home with precautions to return if unable to tolerate fluids. Will send Phenergan PO and PR.  Kristin Love 05/16/2015, 1:56 PM   OB FELLOW MAU DISCHARGE ATTESTATION  I have seen and examined this patient; I agree with above documentation in the resident's note.    Desma Maxim, MD 4:04 PM

## 2015-05-22 ENCOUNTER — Ambulatory Visit (INDEPENDENT_AMBULATORY_CARE_PROVIDER_SITE_OTHER): Payer: Commercial Managed Care - HMO | Admitting: Obstetrics and Gynecology

## 2015-05-22 ENCOUNTER — Encounter: Payer: Self-pay | Admitting: Obstetrics and Gynecology

## 2015-05-22 VITALS — BP 98/64 | HR 92 | Wt 344.0 lb

## 2015-05-22 DIAGNOSIS — Z3493 Encounter for supervision of normal pregnancy, unspecified, third trimester: Secondary | ICD-10-CM

## 2015-05-22 DIAGNOSIS — O98319 Other infections with a predominantly sexual mode of transmission complicating pregnancy, unspecified trimester: Secondary | ICD-10-CM

## 2015-05-22 DIAGNOSIS — O26843 Uterine size-date discrepancy, third trimester: Secondary | ICD-10-CM

## 2015-05-22 DIAGNOSIS — O34219 Maternal care for unspecified type scar from previous cesarean delivery: Secondary | ICD-10-CM

## 2015-05-22 DIAGNOSIS — E669 Obesity, unspecified: Secondary | ICD-10-CM

## 2015-05-22 DIAGNOSIS — O99213 Obesity complicating pregnancy, third trimester: Secondary | ICD-10-CM

## 2015-05-22 DIAGNOSIS — A6009 Herpesviral infection of other urogenital tract: Secondary | ICD-10-CM

## 2015-05-22 DIAGNOSIS — O98519 Other viral diseases complicating pregnancy, unspecified trimester: Secondary | ICD-10-CM

## 2015-05-22 LAB — POCT URINALYSIS DIP (DEVICE)
Bilirubin Urine: NEGATIVE
Glucose, UA: NEGATIVE mg/dL
Hgb urine dipstick: NEGATIVE
Ketones, ur: NEGATIVE mg/dL
Leukocytes, UA: NEGATIVE
Nitrite: NEGATIVE
Protein, ur: NEGATIVE mg/dL
Specific Gravity, Urine: 1.02 (ref 1.005–1.030)
Urobilinogen, UA: 1 mg/dL (ref 0.0–1.0)
pH: 6.5 (ref 5.0–8.0)

## 2015-05-22 NOTE — Progress Notes (Signed)
OB f/u May 3rd @ 1015.  Pt notified.

## 2015-05-22 NOTE — Progress Notes (Signed)
Subjective:  Kristin Love is a 30 y.o. G4P1021 at [redacted]w[redacted]d being seen today for ongoing prenatal care.  She is currently monitored for the following issues for this low-risk pregnancy and has OBESITY, NOS; MIGRAINE, UNSPEC., W/O INTRACTABLE MIGRAINE; RHINITIS, ALLERGIC; ACNE; INSOMNIA NOS; Supervision of low-risk pregnancy; History of C-section; Previous cesarean delivery affecting pregnancy, antepartum; Genital herpes affecting pregnancy, antepartum; and Bacterial vaginosis on her problem list.  Patient reports no complaints.  Contractions: Not present. Vag. Bleeding: None.  Movement: Present. Denies leaking of fluid. No HSV lesions. Works at [employer], very active. Previous baby 9#7.  The following portions of the patient's history were reviewed and updated as appropriate: allergies, current medications, past family history, past medical history, past social history, past surgical history and problem list. Problem list updated.  Objective:   Filed Vitals:   05/22/15 1550  BP: 98/64  Pulse: 92  Weight: 344 lb (156.037 kg)    Fetal Status:     Movement: Present     General:  Alert, oriented and cooperative. Patient is in no acute distress.  Skin: Skin is warm and dry. No rash noted.   Cardiovascular: Normal heart rate noted  Respiratory: Normal respiratory effort, no problems with respiration noted  Abdomen: Soft, gravid, appropriate for gestational age. Pain/Pressure: Present     Pelvic: Vag. Bleeding: None     Cervical exam deferred        Extremities: Normal range of motion.     Mental Status: Normal mood and affect. Normal behavior. Normal judgment and thought content.   Urinalysis: Urine Protein: Negative Urine Glucose: Negative  Assessment and Plan:  Pregnancy: G4P1021 at [redacted]w[redacted]d  1. Supervision of low-risk pregnancy, third trimester Supervision of low-risk pregnancy, third trimester  Previous cesarean delivery affecting pregnancy, antepartum  Obesity affecting pregnancy  in third trimester  Significant discrepancy between uterine size and clinical dates, antepartum, third trimester - Plan: [place] MFM OB FOLLOW UP, [place] MFM OB FOLLOW UP  Genital herpes affecting pregnancy, antepartum   Preterm labor symptoms and general obstetric precautions including but not limited to vaginal bleeding, contractions, leaking of fluid and fetal movement were reviewed in detail with the patient. Please refer to After Visit Summary for other counseling recommendations.  F/U US for size>dates   Return in about 2 weeks (around 06/05/2015).   Kristin Love Kristin Love, CNM   N.B:. [place] report from [place] unreadable copy> will call for readable report. Per pt, [place] confirmed LMP

## 2015-05-22 NOTE — Patient Instructions (Signed)

## 2015-05-30 ENCOUNTER — Ambulatory Visit (HOSPITAL_COMMUNITY): Payer: Self-pay

## 2015-06-04 ENCOUNTER — Other Ambulatory Visit: Payer: Self-pay | Admitting: Obstetrics and Gynecology

## 2015-06-04 ENCOUNTER — Ambulatory Visit (HOSPITAL_COMMUNITY)
Admission: RE | Admit: 2015-06-04 | Discharge: 2015-06-04 | Disposition: A | Discharge status: Home / Self Care | Payer: Commercial Managed Care - HMO | Source: Ambulatory Visit | Attending: Obstetrics and Gynecology | Admitting: Obstetrics and Gynecology

## 2015-06-04 DIAGNOSIS — O99213 Obesity complicating pregnancy, third trimester: Secondary | ICD-10-CM | POA: Insufficient documentation

## 2015-06-04 DIAGNOSIS — Z36 Encounter for antenatal screening of mother: Secondary | ICD-10-CM | POA: Diagnosis not present

## 2015-06-04 DIAGNOSIS — Z3A32 32 weeks gestation of pregnancy: Secondary | ICD-10-CM

## 2015-06-04 DIAGNOSIS — O26843 Uterine size-date discrepancy, third trimester: Secondary | ICD-10-CM

## 2015-06-04 DIAGNOSIS — Z3689 Encounter for other specified antenatal screening: Secondary | ICD-10-CM

## 2015-06-06 ENCOUNTER — Ambulatory Visit (INDEPENDENT_AMBULATORY_CARE_PROVIDER_SITE_OTHER): Payer: Commercial Managed Care - HMO | Admitting: Certified Nurse Midwife

## 2015-06-06 ENCOUNTER — Encounter: Payer: Self-pay | Admitting: Advanced Practice Midwife

## 2015-06-06 VITALS — BP 112/57 | HR 108 | Wt 350.4 lb

## 2015-06-06 DIAGNOSIS — E669 Obesity, unspecified: Secondary | ICD-10-CM

## 2015-06-06 DIAGNOSIS — B373 Candidiasis of vulva and vagina: Secondary | ICD-10-CM

## 2015-06-06 DIAGNOSIS — Z3493 Encounter for supervision of normal pregnancy, unspecified, third trimester: Secondary | ICD-10-CM

## 2015-06-06 DIAGNOSIS — O98813 Other maternal infectious and parasitic diseases complicating pregnancy, third trimester: Secondary | ICD-10-CM

## 2015-06-06 DIAGNOSIS — B3731 Acute candidiasis of vulva and vagina: Secondary | ICD-10-CM | POA: Insufficient documentation

## 2015-06-06 DIAGNOSIS — O34219 Maternal care for unspecified type scar from previous cesarean delivery: Secondary | ICD-10-CM

## 2015-06-06 DIAGNOSIS — O99213 Obesity complicating pregnancy, third trimester: Secondary | ICD-10-CM

## 2015-06-06 LAB — POCT URINALYSIS DIP (DEVICE)
Bilirubin Urine: NEGATIVE
Glucose, UA: NEGATIVE mg/dL
Ketones, ur: NEGATIVE mg/dL
Nitrite: NEGATIVE
Protein, ur: 30 mg/dL — AB
Specific Gravity, Urine: 1.02 (ref 1.005–1.030)
Urobilinogen, UA: 0.2 mg/dL (ref 0.0–1.0)
pH: 7 (ref 5.0–8.0)

## 2015-06-06 MED ORDER — TERCONAZOLE 0.8 % VA CREA: 1.0000 | TOPICAL_CREAM | Freq: Every day | VAGINAL | Status: DC

## 2015-06-06 NOTE — Progress Notes (Signed)
Pt reports feeling vaginal itching and irritation.

## 2015-06-06 NOTE — Addendum Note (Signed)
Addended by: Langston Reusing on: 06/06/2015 03:45 PM   Modules accepted: Orders

## 2015-06-06 NOTE — Progress Notes (Signed)
Subjective:  Kristin Love is a 30 y.o. G4P1021 at [redacted]w[redacted]d being seen today for ongoing prenatal care.  She is currently monitored for the following issues for this low-risk pregnancy and has OBESITY, NOS; INSOMNIA NOS; Supervision of low-risk pregnancy; Previous cesarean delivery affecting pregnancy, antepartum; Genital herpes affecting pregnancy, antepartum; Bacterial vaginosis; and Vaginal yeast infection on her problem list.  Patient reports vaginal irritation.  Contractions: Not present. Vag. Bleeding: None.  Movement: Present. Denies leaking of fluid.   The following portions of the patient's history were reviewed and updated as appropriate: allergies, current medications, past family history, past medical history, past social history, past surgical history and problem list. Problem list updated.  Objective:   Filed Vitals:   06/06/15 0901  BP: 112/57  Pulse: 108  Weight: 350 lb 6.4 oz (158.94 kg)    Fetal Status: Fetal Heart Rate (bpm): 140 Fundal Height: 39 cm Movement: Present     General:  Alert, oriented and cooperative. Patient is in no acute distress.  Skin: Skin is warm and dry. No rash noted.   Cardiovascular: Normal heart rate noted  Respiratory: Normal respiratory effort, no problems with respiration noted  Abdomen: Soft, gravid, appropriate for gestational age. Pain/Pressure: Present     Pelvic: Vag. Bleeding: None     Cervical exam deferred        Extremities: Normal range of motion.  Edema: None  Mental Status: Normal mood and affect. Normal behavior. Normal judgment and thought content.   Urinalysis: Urine Protein: 1+ Urine Glucose: Negative  Assessment and Plan:  Pregnancy: G4P1021 at [redacted]w[redacted]d  1. Supervision of low-risk pregnancy, third trimester Blind wet prep - Culture, OB Urine - [place] MFM OB FOLLOW UP; Future Terazol 3 RX  2. OBESITY, NOS   3. Previous cesarean delivery affecting pregnancy, antepartum   4. Vaginal yeast infection   Preterm labor  symptoms and general obstetric precautions including but not limited to vaginal bleeding, contractions, leaking of fluid and fetal movement were reviewed in detail with the patient. Please refer to After Visit Summary for other counseling recommendations.  Return in about 2 weeks (around 06/20/2015) for Routine OB.   Larey Days, CNM

## 2015-06-06 NOTE — Patient Instructions (Signed)
Preterm Labor Information Preterm labor is when labor starts at less than 37 weeks of pregnancy. The normal length of a pregnancy is 39 to 41 weeks. CAUSES Often, there is no identifiable underlying cause as to why a woman goes into preterm labor. One of the most common known causes of preterm labor is infection. Infections of the uterus, cervix, vagina, amniotic sac, bladder, kidney, or even the lungs (pneumonia) can cause labor to start. Other suspected causes of preterm labor include:   Urogenital infections, such as yeast infections and bacterial vaginosis.   Uterine abnormalities (uterine shape, uterine septum, fibroids, or bleeding from the placenta).   A cervix that has been operated on (it may fail to stay closed).   Malformations in the fetus.   Multiple gestations (twins, triplets, and so on).   Breakage of the amniotic sac.  RISK FACTORS 1. Having a previous history of preterm labor.  2. Having premature rupture of membranes (PROM).  3. Having a placenta that covers the opening of the cervix (placenta previa).  4. Having a placenta that separates from the uterus (placental abruption).  5. Having a cervix that is too weak to hold the fetus in the uterus (incompetent cervix).  6. Having too much fluid in the amniotic sac (polyhydramnios).  7. Taking illegal drugs or smoking while pregnant.  8. Not gaining enough weight while pregnant.  47. Being younger than 62 and older than 29 years old.  10. Having a low socioeconomic status.  61. Being African American. SYMPTOMS Signs and symptoms of preterm labor include:   Menstrual-like cramps, abdominal pain, or back pain.  Uterine contractions that are regular, as frequent as six in an hour, regardless of their intensity (may be mild or painful).  Contractions that start on the top of the uterus and spread down to the lower abdomen and back.   A sense of increased pelvic pressure.   A watery or bloody mucus  discharge that comes from the vagina.  TREATMENT Depending on the length of the pregnancy and other circumstances, your health care provider may suggest bed rest. If necessary, there are medicines that can be given to stop contractions and to mature the fetal lungs. If labor happens before 34 weeks of pregnancy, a prolonged hospital stay may be recommended. Treatment depends on the condition of both you and the fetus.  WHAT SHOULD YOU DO IF YOU THINK YOU ARE IN PRETERM LABOR? Call your health care provider right away. You will need to go to the hospital to get checked immediately. HOW CAN YOU PREVENT PRETERM LABOR IN FUTURE PREGNANCIES? You should:   Stop smoking if you smoke.  Maintain healthy weight gain and avoid chemicals and drugs that are not necessary.  Be watchful for any type of infection.  Inform your health care provider if you have a known history of preterm labor.   This information is not intended to replace advice given to you by your health care provider. Make sure you discuss any questions you have with your health care provider.   Document Released: 04/05/2003 Document Revised: 09/15/2012 Document Reviewed: 02/16/2012 Elsevier Interactive Patient Education 2016 [place].   Fetal Movement Counts Patient Name: __________________________________________________ Patient Due Date: ____________________ Performing a fetal movement count is highly recommended in high-risk pregnancies, but it is good for every pregnant woman to do. Your health care provider may ask you to start counting fetal movements at 28 weeks of the pregnancy. Fetal movements often increase:  After eating a full  meal.  After physical activity.  After eating or drinking something sweet or cold.  At rest. Pay attention to when you feel the baby is most active. This will help you notice a pattern of your baby's sleep and wake cycles and what factors contribute to an increase in fetal movement. It is  important to perform a fetal movement count at the same time each day when your baby is normally most active.  HOW TO COUNT FETAL MOVEMENTS 12. Find a quiet and comfortable area to sit or lie down on your left side. Lying on your left side provides the best blood and oxygen circulation to your baby. 13. Write down the day and time on a sheet of paper or in a journal. 14. Start counting kicks, flutters, swishes, rolls, or jabs in a 2-hour period. You should feel at least 10 movements within 2 hours. 15. If you do not feel 10 movements in 2 hours, wait 2-3 hours and count again. Look for a change in the pattern or not enough counts in 2 hours. SEEK MEDICAL CARE IF:  You feel less than 10 counts in 2 hours, tried twice.  There is no movement in over an hour.  The pattern is changing or taking longer each day to reach 10 counts in 2 hours.  You feel the baby is not moving as he or she usually does. Date: ____________ Movements: ____________ Start time: ____________ Kristin Love time: ____________  Date: ____________ Movements: ____________ Start time: ____________ Kristin Love time: ____________ Date: ____________ Movements: ____________ Start time: ____________ Kristin Love time: ____________ Date: ____________ Movements: ____________ Start time: ____________ Kristin Love time: ____________ Date: ____________ Movements: ____________ Start time: ____________ Kristin Love time: ____________ Date: ____________ Movements: ____________ Start time: ____________ Kristin Love time: ____________ Date: ____________ Movements: ____________ Start time: ____________ Kristin Love time: ____________ Date: ____________ Movements: ____________ Start time: ____________ Kristin Love time: ____________  Date: ____________ Movements: ____________ Start time: ____________ Kristin Love time: ____________ Date: ____________ Movements: ____________ Start time: ____________ Kristin Love time: ____________ Date: ____________ Movements: ____________ Start time: ____________ Kristin Love  time: ____________ Date: ____________ Movements: ____________ Start time: ____________ Kristin Love time: ____________ Date: ____________ Movements: ____________ Start time: ____________ Kristin Love time: ____________ Date: ____________ Movements: ____________ Start time: ____________ Kristin Love time: ____________ Date: ____________ Movements: ____________ Start time: ____________ Kristin Love time: ____________  Date: ____________ Movements: ____________ Start time: ____________ Kristin Love time: ____________ Date: ____________ Movements: ____________ Start time: ____________ Kristin Love time: ____________ Date: ____________ Movements: ____________ Start time: ____________ Kristin Love time: ____________ Date: ____________ Movements: ____________ Start time: ____________ Kristin Love time: ____________ Date: ____________ Movements: ____________ Start time: ____________ Kristin Love time: ____________ Date: ____________ Movements: ____________ Start time: ____________ Kristin Love time: ____________ Date: ____________ Movements: ____________ Start time: ____________ Kristin Love time: ____________  Date: ____________ Movements: ____________ Start time: ____________ Kristin Love time: ____________ Date: ____________ Movements: ____________ Start time: ____________ Kristin Love time: ____________ Date: ____________ Movements: ____________ Start time: ____________ Kristin Love time: ____________ Date: ____________ Movements: ____________ Start time: ____________ Kristin Love time: ____________ Date: ____________ Movements: ____________ Start time: ____________ Kristin Love time: ____________ Date: ____________ Movements: ____________ Start time: ____________ Kristin Love time: ____________ Date: ____________ Movements: ____________ Start time: ____________ Kristin Love time: ____________  Date: ____________ Movements: ____________ Start time: ____________ Kristin Love time: ____________ Date: ____________ Movements: ____________ Start time: ____________ Kristin Love time: ____________ Date: ____________  Movements: ____________ Start time: ____________ Kristin Love time: ____________ Date: ____________ Movements: ____________ Start time: ____________ Kristin Love time: ____________ Date: ____________ Movements: ____________ Start time: ____________ Kristin Love time: ____________ Date: ____________ Movements: ____________ Start time: ____________ Kristin Love time: ____________ Date: ____________ Movements: ____________ Start  time: ____________ Kristin Love time: ____________  Date: ____________ Movements: ____________ Start time: ____________ Kristin Love time: ____________ Date: ____________ Movements: ____________ Start time: ____________ Kristin Love time: ____________ Date: ____________ Movements: ____________ Start time: ____________ Kristin Love time: ____________ Date: ____________ Movements: ____________ Start time: ____________ Kristin Love time: ____________ Date: ____________ Movements: ____________ Start time: ____________ Kristin Love time: ____________ Date: ____________ Movements: ____________ Start time: ____________ Kristin Love time: ____________ Date: ____________ Movements: ____________ Start time: ____________ Kristin Love time: ____________  Date: ____________ Movements: ____________ Start time: ____________ Kristin Love time: ____________ Date: ____________ Movements: ____________ Start time: ____________ Kristin Love time: ____________ Date: ____________ Movements: ____________ Start time: ____________ Kristin Love time: ____________ Date: ____________ Movements: ____________ Start time: ____________ Kristin Love time: ____________ Date: ____________ Movements: ____________ Start time: ____________ Kristin Love time: ____________ Date: ____________ Movements: ____________ Start time: ____________ Kristin Love time: ____________ Date: ____________ Movements: ____________ Start time: ____________ Kristin Love time: ____________  Date: ____________ Movements: ____________ Start time: ____________ Kristin Love time: ____________ Date: ____________ Movements: ____________ Start time:  ____________ Kristin Love time: ____________ Date: ____________ Movements: ____________ Start time: ____________ Kristin Love time: ____________ Date: ____________ Movements: ____________ Start time: ____________ Kristin Love time: ____________ Date: ____________ Movements: ____________ Start time: ____________ Kristin Love time: ____________ Date: ____________ Movements: ____________ Start time: ____________ Kristin Love time: ____________   This information is not intended to replace advice given to you by your health care provider. Make sure you discuss any questions you have with your health care provider.   Document Released: 02/12/2006 Document Revised: 02/03/2014 Document Reviewed: 11/10/2011 Elsevier Interactive Patient Education [employer].

## 2015-06-07 LAB — WET PREP, GENITAL
Trich, Wet Prep: NONE SEEN
WBC, Wet Prep HPF POC: NONE SEEN
Yeast Wet Prep HPF POC: NONE SEEN

## 2015-06-09 LAB — CULTURE, OB URINE: Colony Count: >=100000

## 2015-06-11 ENCOUNTER — Other Ambulatory Visit: Payer: Self-pay | Admitting: Student

## 2015-06-11 DIAGNOSIS — O2343 Unspecified infection of urinary tract in pregnancy, third trimester: Secondary | ICD-10-CM

## 2015-06-11 MED ORDER — AMOXICILLIN-POT CLAVULANATE 500-125 MG PO TABS: 1.0000 | ORAL_TABLET | Freq: Two times a day (BID) | ORAL | Status: DC

## 2015-06-15 ENCOUNTER — Telehealth: Payer: Self-pay

## 2015-06-15 NOTE — Telephone Encounter (Signed)
Pt has been informed of UTI and abx have been sent to the pharmacy.

## 2015-06-26 ENCOUNTER — Encounter: Payer: Self-pay | Admitting: Family Medicine

## 2015-06-27 ENCOUNTER — Inpatient Hospital Stay (HOSPITAL_COMMUNITY)
Admission: AD | Admit: 2015-06-27 | Discharge: 2015-06-27 | Disposition: A | Discharge status: Home / Self Care | Payer: Commercial Managed Care - HMO | Source: Ambulatory Visit | Attending: Obstetrics and Gynecology | Admitting: Obstetrics and Gynecology

## 2015-06-27 ENCOUNTER — Encounter (HOSPITAL_COMMUNITY): Payer: Self-pay | Admitting: *Deleted

## 2015-06-27 DIAGNOSIS — Z3A35 35 weeks gestation of pregnancy: Secondary | ICD-10-CM | POA: Insufficient documentation

## 2015-06-27 DIAGNOSIS — O23593 Infection of other part of genital tract in pregnancy, third trimester: Secondary | ICD-10-CM | POA: Diagnosis not present

## 2015-06-27 DIAGNOSIS — B9689 Other specified bacterial agents as the cause of diseases classified elsewhere: Secondary | ICD-10-CM | POA: Diagnosis not present

## 2015-06-27 DIAGNOSIS — N949 Unspecified condition associated with female genital organs and menstrual cycle: Secondary | ICD-10-CM

## 2015-06-27 DIAGNOSIS — N760 Acute vaginitis: Secondary | ICD-10-CM

## 2015-06-27 DIAGNOSIS — R102 Pelvic and perineal pain: Secondary | ICD-10-CM

## 2015-06-27 DIAGNOSIS — O26893 Other specified pregnancy related conditions, third trimester: Secondary | ICD-10-CM

## 2015-06-27 DIAGNOSIS — R109 Unspecified abdominal pain: Secondary | ICD-10-CM | POA: Diagnosis present

## 2015-06-27 LAB — WET PREP, GENITAL
Sperm: NONE SEEN
Trich, Wet Prep: NONE SEEN
Yeast Wet Prep HPF POC: NONE SEEN

## 2015-06-27 LAB — URINALYSIS, ROUTINE W REFLEX MICROSCOPIC
Bilirubin Urine: NEGATIVE
Glucose, UA: NEGATIVE mg/dL
Hgb urine dipstick: NEGATIVE
Ketones, ur: NEGATIVE mg/dL
Leukocytes, UA: NEGATIVE
Nitrite: NEGATIVE
Protein, ur: NEGATIVE mg/dL
Specific Gravity, Urine: 1.02 (ref 1.005–1.030)
pH: 6 (ref 5.0–8.0)

## 2015-06-27 MED ORDER — METRONIDAZOLE 500 MG PO TABS: 500.0000 mg | ORAL_TABLET | Freq: Two times a day (BID) | ORAL | Status: AC

## 2015-06-27 NOTE — MAU Note (Signed)
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 Fetus active. Denies bleeding but notes a discharge throughout the pregnancy that has increased lately.

## 2015-06-27 NOTE — MAU Provider Note (Signed)
MAU HISTORY AND PHYSICAL  Chief Complaint:  Abdominal Pain   Kristin Love is a 30 y.o.  [id number] with IUP at [redacted]w[redacted]d presenting for Abdominal Pain Constant abdominal pain since 6:30 pm that doesn't go away. Pain is about 7/10. Worse with standing. She never had such pain before. 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  She also reports baby having hiccup for about a week. She was told by a friend that baby gets hiccup when the cord is wrapped around the baby's neck. She reports feeling baby's moving as usual.  Denies vaginal bleeding. Reports vaginal discharge more like water. She couldn't tell the color of the discharge. She had the discharge the whole pregnancy. Says the discharge is a little bit more.  No gush fluid, dysuria, fever.  No concern about this pregnancy. Planning repeat c/s.  Last sexual intercourse about a week ago.  Off note patient had wet prep with positive clue cells three weeks ago which was not treated. Past Medical History  Diagnosis Date  . Herpes   . Vaginal Pap smear, abnormal     had colposcopy repeat pap negative    Past Surgical History  Procedure Laterality Date  . Cesarean section      Family History  Problem Relation Age of Onset  . Hypertension Mother   . Hypertension Father   . Diabetes Father     Social History  Substance Use Topics  . Smoking status: Never Smoker   . Smokeless tobacco: Never Used  . Alcohol Use: No    Allergies  Allergen Reactions  . Shellfish Allergy Anaphylaxis  . Fish Allergy Swelling    Raw shrimp    Prescriptions prior to admission  Medication Sig Dispense Refill Last Dose  . flintstones complete (FLINTSTONES) 60 MG chewable tablet Chew 2 tablets by mouth daily. Alternates with prenatal vitamins.   06/27/2015 at Unknown time  . Prenatal Vit-Fe Fumarate-FA (PRENATAL MULTIVITAMIN) TABS tablet Take 1 tablet by mouth daily at 12 noon.   Past Week at  Unknown time  . amoxicillin-clavulanate (AUGMENTIN) 500-125 MG tablet Take 1 tablet (500 mg total) by mouth 2 (two) times daily. (Patient not taking: Reported on 06/27/2015) 14 tablet 0 Not Taking at Unknown time  . metroNIDAZOLE (FLAGYL) 500 MG tablet Take 1 tablet (500 mg total) by mouth 2 (two) times daily. (Patient not taking: Reported on 05/22/2015) 14 tablet 0 Completed Course at Unknown time  . promethazine (PHENERGAN) 12.5 MG tablet Take 2 tablets (25 mg total) by mouth every 6 (six) hours as needed for nausea or vomiting. (Patient not taking: Reported on 06/06/2015) 20 tablet 0 Not Taking at Unknown time  . promethazine (PHENERGAN) 25 MG suppository Place 1 suppository (25 mg total) rectally every 6 (six) hours as needed for nausea or vomiting (Use if unable to keep pill down). (Patient not taking: Reported on 06/06/2015) 12 each 0 Not Taking at Unknown time  . terconazole (TERAZOL 3) 0.8 % vaginal cream Place 1 applicator vaginally at bedtime. (Patient not taking: Reported on 06/27/2015) 20 g 0 Not Taking at Unknown time    Review of Systems - Negative except for what is mentioned in HPI.  Physical Exam  Blood pressure 114/69, pulse 109, temperature 98.1 F (36.7 C), temperature source Oral, resp. rate 18, height 5\' 8"  (1.727 m), weight 350 lb 6 oz (158.929 kg), last menstrual period 10/23/2014, unknown if currently breastfeeding. GENERAL: Well-developed, well-nourished female  in no acute distress.  LUNGS: Clear to auscultation bilaterally.  HEART: Regular rate and rhythm. ABDOMEN: Soft, nontender, nondistended, gravid.  EXTREMITIES: Nontender, no edema, 2+ distal pulses. Cervical Exam: closed/thick and high Speculum exam: no bleeding, pooling. Some whitish discharge. Cervix visually closed. Presentation: cephalic FHT: [id number] decels Contractions: none on toco   Labs: Results for orders placed or performed during the hospital encounter of 06/27/15 (from the past 24 hour(s))   Urinalysis, Routine w reflex microscopic (not at [facility])   Collection Time: 06/27/15  8:35 PM  Result Value Ref Range   Color, Urine YELLOW YELLOW   APPearance CLEAR CLEAR   Specific Gravity, Urine 1.020 1.005 - 1.030   pH 6.0 5.0 - 8.0   Glucose, UA NEGATIVE NEGATIVE mg/dL   Hgb urine dipstick NEGATIVE NEGATIVE   Bilirubin Urine NEGATIVE NEGATIVE   Ketones, ur NEGATIVE NEGATIVE mg/dL   Protein, ur NEGATIVE NEGATIVE mg/dL   Nitrite NEGATIVE NEGATIVE   Leukocytes, UA NEGATIVE NEGATIVE  Wet prep, genital   Collection Time: 06/27/15 10:20 PM  Result Value Ref Range   Yeast Wet Prep HPF POC NONE SEEN NONE SEEN   Trich, Wet Prep NONE SEEN NONE SEEN   Clue Cells Wet Prep HPF POC PRESENT (A) NONE SEEN   WBC, Wet Prep HPF POC MODERATE (A) NONE SEEN   Sperm NONE SEEN     Imaging Studies:  [place] Mfm Ob Detail +14 Wk  06/04/2015  OBSTETRICAL ULTRASOUND: This exam was performed within a [facility]. The OB US report was generated in the [facility] system, and faxed to the ordering physician.  This report is available in the [facility]. See the [facility] Obstetric US report via the Image Link.   Assessment: Kristin Love is  30 y.o. [phone] at [redacted]w[redacted]d presents with abdominal pain and found to have bacterial vaginosis. Her wet prep about three weeks ago was significant for BV but not treated. Doubt preterm labor with closed cervix. No bleeding or tenderness to think abruption. FWB-CAT-1. UA negative for UTI.  Plan: Discharge home on metronidazole 500 mg twice a day for 7 days Follow up in clinic. She has apt on 07/10/2015. Advised her to keep her appointments Discussed return precautions Reassured about the hiccups  Mercy Riding 5/31/20179:55 PM   I was present for the exam and agree with above. Pt concerned abotu recurrent BV. Recommend pelvic rest x 1 week during Tx. May need 6 months of Metrogel twice a week and/or may try probiotics and/or repHresh. Encouraged to keep prenatal appts.    Gray, CNM 06/28/2015 12:16 AM

## 2015-06-27 NOTE — Discharge Instructions (Signed)

## 2015-06-28 LAB — GC/CHLAMYDIA PROBE AMP (~~LOC~~) NOT AT ARMC
Chlamydia: NEGATIVE
Neisseria Gonorrhea: NEGATIVE

## 2015-07-04 ENCOUNTER — Ambulatory Visit (HOSPITAL_COMMUNITY)
Admission: RE | Admit: 2015-07-04 | Discharge: 2015-07-04 | Disposition: A | Discharge status: Home / Self Care | Payer: Medicaid Other | Source: Ambulatory Visit | Attending: Student | Admitting: Student

## 2015-07-04 DIAGNOSIS — O26843 Uterine size-date discrepancy, third trimester: Secondary | ICD-10-CM | POA: Diagnosis not present

## 2015-07-04 DIAGNOSIS — Z3493 Encounter for supervision of normal pregnancy, unspecified, third trimester: Secondary | ICD-10-CM

## 2015-07-04 DIAGNOSIS — Z3A36 36 weeks gestation of pregnancy: Secondary | ICD-10-CM | POA: Diagnosis not present

## 2015-07-04 DIAGNOSIS — Z36 Encounter for antenatal screening of mother: Secondary | ICD-10-CM | POA: Insufficient documentation

## 2015-07-04 DIAGNOSIS — O99213 Obesity complicating pregnancy, third trimester: Secondary | ICD-10-CM | POA: Insufficient documentation

## 2015-07-05 ENCOUNTER — Ambulatory Visit (INDEPENDENT_AMBULATORY_CARE_PROVIDER_SITE_OTHER): Payer: Medicaid Other | Admitting: Obstetrics and Gynecology

## 2015-07-05 ENCOUNTER — Other Ambulatory Visit (HOSPITAL_COMMUNITY)
Admission: RE | Admit: 2015-07-05 | Discharge: 2015-07-05 | Disposition: A | Discharge status: Home / Self Care | Payer: Commercial Managed Care - HMO | Source: Ambulatory Visit | Attending: Obstetrics and Gynecology | Admitting: Obstetrics and Gynecology

## 2015-07-05 VITALS — BP 109/68 | HR 97 | Wt 355.0 lb

## 2015-07-05 DIAGNOSIS — O34219 Maternal care for unspecified type scar from previous cesarean delivery: Secondary | ICD-10-CM

## 2015-07-05 DIAGNOSIS — A6009 Herpesviral infection of other urogenital tract: Secondary | ICD-10-CM

## 2015-07-05 DIAGNOSIS — Z113 Encounter for screening for infections with a predominantly sexual mode of transmission: Secondary | ICD-10-CM | POA: Insufficient documentation

## 2015-07-05 DIAGNOSIS — O99213 Obesity complicating pregnancy, third trimester: Secondary | ICD-10-CM

## 2015-07-05 DIAGNOSIS — O98313 Other infections with a predominantly sexual mode of transmission complicating pregnancy, third trimester: Secondary | ICD-10-CM | POA: Diagnosis not present

## 2015-07-05 DIAGNOSIS — Z3493 Encounter for supervision of normal pregnancy, unspecified, third trimester: Secondary | ICD-10-CM

## 2015-07-05 DIAGNOSIS — O98319 Other infections with a predominantly sexual mode of transmission complicating pregnancy, unspecified trimester: Secondary | ICD-10-CM

## 2015-07-05 DIAGNOSIS — E669 Obesity, unspecified: Secondary | ICD-10-CM

## 2015-07-05 LAB — POCT URINALYSIS DIP (DEVICE)
Bilirubin Urine: NEGATIVE
Glucose, UA: NEGATIVE mg/dL
Hgb urine dipstick: NEGATIVE
Ketones, ur: NEGATIVE mg/dL
Leukocytes, UA: NEGATIVE
Nitrite: NEGATIVE
Protein, ur: NEGATIVE mg/dL
Specific Gravity, Urine: 1.015 (ref 1.005–1.030)
Urobilinogen, UA: 1 mg/dL (ref 0.0–1.0)
pH: 7 (ref 5.0–8.0)

## 2015-07-05 LAB — OB RESULTS CONSOLE GBS: GBS: POSITIVE

## 2015-07-05 LAB — OB RESULTS CONSOLE GC/CHLAMYDIA: Gonorrhea: NEGATIVE

## 2015-07-05 NOTE — Progress Notes (Signed)
Subjective:  Kristin Love is a 30 y.o. G4P1021 at [redacted]w[redacted]d being seen today for ongoing prenatal care.   She is currently monitored for the following issues for this high-risk pregnancy and has OBESITY, NOS; Supervision of low-risk pregnancy; Previous cesarean delivery affecting pregnancy, antepartum; and Genital herpes affecting pregnancy, antepartum on her problem list.  Patient reports no complaints.   The following portions of the patient's history were reviewed and updated as appropriate: allergies, current medications, past family history, past medical history, past social history, past surgical history and problem list. Problem list updated.  Objective:   Filed Vitals:   07/05/15 0955  BP: 109/68  Pulse: 97  Weight: 355 lb (161.027 kg)    Fetal Status: Fetal Heart Rate (bpm): 139 Fundal Height: 40 cm Movement: Present  Presentation: Vertex  General:  Alert, oriented and cooperative. Patient is in no acute distress.  Skin: Skin is warm and dry. No rash noted.   Cardiovascular: Normal heart rate noted  Respiratory: Normal respiratory effort, no problems with respiration noted  Abdomen: Soft, gravid, appropriate for gestational age. Pain/Pressure: Present     Pelvic: egbus neg  Extremities: Normal range of motion.  Edema: Trace  Mental Status: Normal mood and affect. Normal behavior. Normal judgment and thought content.   Urinalysis: Urine Protein: Negative Urine Glucose: Negative  Assessment and Plan:  Pregnancy: G4P1021 at [redacted]w[redacted]d  1. Supervision of low-risk pregnancy, third trimester - Culture, beta strep (group b only) - GC/Chlamydia probe amp ([place])not at [facility] - IUD - UCx TOC today  2. Previous cesarean delivery affecting pregnancy, antepartum -request sent for repeat  3. OBESITY, NOS BMI53 6/7 efw 81%, AC >97%, nl AFI  4. Genital herpes affecting pregnancy, antepartum Start valtrex ppx today  Preterm labor symptoms and general obstetric precautions  including but not limited to vaginal bleeding, contractions, leaking of fluid and fetal movement were reviewed in detail with the patient. Please refer to After Visit Summary for other counseling recommendations.  Return in about 1 week (around 07/12/2015).   Aletha Halim, MD

## 2015-07-05 NOTE — Progress Notes (Signed)
Cultures today 

## 2015-07-06 ENCOUNTER — Encounter (HOSPITAL_COMMUNITY): Payer: Self-pay | Admitting: *Deleted

## 2015-07-06 LAB — GC/CHLAMYDIA PROBE AMP (~~LOC~~) NOT AT ARMC
Chlamydia: NEGATIVE
Neisseria Gonorrhea: NEGATIVE

## 2015-07-07 LAB — CULTURE, BETA STREP (GROUP B ONLY)

## 2015-07-09 ENCOUNTER — Encounter (HOSPITAL_COMMUNITY): Payer: Self-pay

## 2015-07-10 ENCOUNTER — Telehealth: Payer: Self-pay | Admitting: General Practice

## 2015-07-10 ENCOUNTER — Ambulatory Visit (INDEPENDENT_AMBULATORY_CARE_PROVIDER_SITE_OTHER): Payer: Commercial Managed Care - HMO | Admitting: Family Medicine

## 2015-07-10 ENCOUNTER — Encounter: Payer: Self-pay | Admitting: Family Medicine

## 2015-07-10 VITALS — BP 119/78 | HR 112 | Wt 358.1 lb

## 2015-07-10 DIAGNOSIS — N898 Other specified noninflammatory disorders of vagina: Secondary | ICD-10-CM

## 2015-07-10 DIAGNOSIS — Z22330 Carrier of Group B streptococcus: Secondary | ICD-10-CM

## 2015-07-10 DIAGNOSIS — Z3493 Encounter for supervision of normal pregnancy, unspecified, third trimester: Secondary | ICD-10-CM

## 2015-07-10 DIAGNOSIS — O99820 Streptococcus B carrier state complicating pregnancy: Secondary | ICD-10-CM | POA: Insufficient documentation

## 2015-07-10 DIAGNOSIS — O26893 Other specified pregnancy related conditions, third trimester: Secondary | ICD-10-CM | POA: Diagnosis not present

## 2015-07-10 LAB — POCT URINALYSIS DIP (DEVICE)
Bilirubin Urine: NEGATIVE
Glucose, UA: NEGATIVE mg/dL
Hgb urine dipstick: NEGATIVE
Ketones, ur: NEGATIVE mg/dL
Nitrite: NEGATIVE
Protein, ur: NEGATIVE mg/dL
Specific Gravity, Urine: 1.02 (ref 1.005–1.030)
Urobilinogen, UA: 1 mg/dL (ref 0.0–1.0)
pH: 7 (ref 5.0–8.0)

## 2015-07-10 MED ORDER — TERCONAZOLE 0.8 % VA CREA: 1.0000 | TOPICAL_CREAM | Freq: Every day | VAGINAL | Status: DC

## 2015-07-10 NOTE — Telephone Encounter (Signed)
Opened in error

## 2015-07-10 NOTE — Progress Notes (Signed)
Subjective:  Kristin Love is a 30 y.o. [phone] at [redacted]w[redacted]d being seen today for ongoing prenatal care.  She is currently monitored for the following issues for this low-risk pregnancy and has OBESITY, NOS; Supervision of low-risk pregnancy; Previous cesarean delivery affecting pregnancy, antepartum; Genital herpes affecting pregnancy, antepartum; and Group B Streptococcus carrier, +RV culture, currently pregnant on her problem list.  Patient reports no complaints.  Contractions: Irregular. Vag. Bleeding: None.  Movement: Present. Denies leaking of fluid.   The following portions of the patient's history were reviewed and updated as appropriate: allergies, current medications, past family history, past medical history, past social history, past surgical history and problem list. Problem list updated.  Objective:   Filed Vitals:   07/10/15 0826  BP: 119/78  Pulse: 112  Weight: 358 lb 1.6 oz (162.433 kg)    Fetal Status:     Movement: Present     General:  Alert, oriented and cooperative. Patient is in no acute distress.  Skin: Skin is warm and dry. No rash noted.   Cardiovascular: Normal heart rate noted  Respiratory: Normal respiratory effort, no problems with respiration noted  Abdomen: Soft, gravid, appropriate for gestational age. Pain/Pressure: Present     Pelvic: Cervical exam deferred        Extremities: Normal range of motion.  Edema: Trace  Mental Status: Normal mood and affect. Normal behavior. Normal judgment and thought content.   Urinalysis: Urine Protein: Negative Urine Glucose: Negative  Assessment and Plan:  Pregnancy: G4P1021 at [redacted]w[redacted]d  1. Vaginal discharge during pregnancy, third trimester Presumptive treatment due to itching following rx with Abx - Wet prep, genital - terconazole (TERAZOL 3) 0.8 % vaginal cream; Place 1 applicator vaginally at bedtime.  Dispense: 20 g; Refill: 0  2. Supervision of low-risk pregnancy, third trimester Continue routine prenatal  care.   3. Group B Streptococcus carrier, +RV culture, currently pregnant Treatment in labor if needed for RLTCS  Term labor symptoms and general obstetric precautions including but not limited to vaginal bleeding, contractions, leaking of fluid and fetal movement were reviewed in detail with the patient. Please refer to After Visit Summary for other counseling recommendations.  Return in 1 week (on 07/17/2015).   Donnamae Jude, MD

## 2015-07-10 NOTE — Progress Notes (Signed)
Vaginal discharge with itchiness

## 2015-07-10 NOTE — Patient Instructions (Signed)
Breastfeeding Deciding to breastfeed is one of the best choices you can make for you and your baby. A change in hormones during pregnancy causes your breast tissue to grow and increases the number and size of your milk ducts. These hormones also allow proteins, sugars, and fats from your blood supply to make breast milk in your milk-producing glands. Hormones prevent breast milk from being released before your baby is born as well as prompt milk flow after birth. Once breastfeeding has begun, thoughts of your baby, as well as his or her sucking or crying, can stimulate the release of milk from your milk-producing glands.  BENEFITS OF BREASTFEEDING For Your Baby  Your first milk (colostrum) helps your baby's digestive system function better.  There are antibodies in your milk that help your baby fight off infections.  Your baby has a lower incidence of asthma, allergies, and sudden infant death syndrome.  The nutrients in breast milk are better for your baby than infant formulas and are designed uniquely for your baby's needs.  Breast milk improves your baby's brain development.  Your baby is less likely to develop other conditions, such as childhood obesity, asthma, or type 2 diabetes mellitus. For You  Breastfeeding helps to create a very special bond between you and your baby.  Breastfeeding is convenient. Breast milk is always available at the correct temperature and costs nothing.  Breastfeeding helps to burn calories and helps you lose the weight gained during pregnancy.  Breastfeeding makes your uterus contract to its prepregnancy size faster and slows bleeding (lochia) after you give birth.   Breastfeeding helps to lower your risk of developing type 2 diabetes mellitus, osteoporosis, and breast or ovarian cancer later in life. SIGNS THAT YOUR BABY IS HUNGRY Early Signs of Hunger  Increased alertness or activity.  Stretching.  Movement of the head from side to  side.  Movement of the head and opening of the mouth when the corner of the mouth or cheek is stroked (rooting).  Increased sucking sounds, smacking lips, cooing, sighing, or squeaking.  Hand-to-mouth movements.  Increased sucking of fingers or hands. Late Signs of Hunger  Fussing.  Intermittent crying. Extreme Signs of Hunger Signs of extreme hunger will require calming and consoling before your baby will be able to breastfeed successfully. Do not wait for the following signs of extreme hunger to occur before you initiate breastfeeding:  Restlessness.  A loud, strong cry.  Screaming. BREASTFEEDING BASICS Breastfeeding Initiation  Find a comfortable place to sit or lie down, with your neck and back well supported.  Place a pillow or rolled up blanket under your baby to bring him or her to the level of your breast (if you are seated). Nursing pillows are specially designed to help support your arms and your baby while you breastfeed.  Make sure that your baby's abdomen is facing your abdomen.  Gently massage your breast. With your fingertips, massage from your chest wall toward your nipple in a circular motion. This encourages milk flow. You may need to continue this action during the feeding if your milk flows slowly.  Support your breast with 4 fingers underneath and your thumb above your nipple. Make sure your fingers are well away from your nipple and your baby's mouth.  Stroke your baby's lips gently with your finger or nipple.  When your baby's mouth is open wide enough, quickly bring your baby to your breast, placing your entire nipple and as much of the colored area around your nipple (  areola) as possible into your baby's mouth.  More areola should be visible above your baby's upper lip than below the lower lip.  Your baby's tongue should be between his or her lower gum and your breast.  Ensure that your baby's mouth is correctly positioned around your nipple  (latched). Your baby's lips should create a seal on your breast and be turned out (everted).  It is common for your baby to suck about 2-3 minutes in order to start the flow of breast milk. Latching Teaching your baby how to latch on to your breast properly is very important. An improper latch can cause nipple pain and decreased milk supply for you and poor weight gain in your baby. Also, if your baby is not latched onto your nipple properly, he or she may swallow some air during feeding. This can make your baby fussy. Burping your baby when you switch breasts during the feeding can help to get rid of the air. However, teaching your baby to latch on properly is still the best way to prevent fussiness from swallowing air while breastfeeding. Signs that your baby has successfully latched on to your nipple:  Silent tugging or silent sucking, without causing you pain.  Swallowing heard between every 3-4 sucks.  Muscle movement above and in front of his or her ears while sucking. Signs that your baby has not successfully latched on to nipple:  Sucking sounds or smacking sounds from your baby while breastfeeding.  Nipple pain. If you think your baby has not latched on correctly, slip your finger into the corner of your baby's mouth to break the suction and place it between your baby's gums. Attempt breastfeeding initiation again. Signs of Successful Breastfeeding Signs from your baby:  A gradual decrease in the number of sucks or complete cessation of sucking.  Falling asleep.  Relaxation of his or her body.  Retention of a small amount of milk in his or her mouth.  Letting go of your breast by himself or herself. Signs from you:  Breasts that have increased in firmness, weight, and size 1-3 hours after feeding.  Breasts that are softer immediately after breastfeeding.  Increased milk volume, as well as a change in milk consistency and color by the fifth day of breastfeeding.  Nipples  that are not sore, cracked, or bleeding. Signs That Your Baby is Getting Enough Milk  Wetting at least 3 diapers in a 24-hour period. The urine should be clear and pale yellow by age 5 days.  At least 3 stools in a 24-hour period by age 5 days. The stool should be soft and yellow.  At least 3 stools in a 24-hour period by age 7 days. The stool should be seedy and yellow.  No loss of weight greater than 10% of birth weight during the first 3 days of age.  Average weight gain of 4-7 ounces (113-198 g) per week after age 4 days.  Consistent daily weight gain by age 5 days, without weight loss after the age of 2 weeks. After a feeding, your baby may spit up a small amount. This is common. BREASTFEEDING FREQUENCY AND DURATION Frequent feeding will help you make more milk and can prevent sore nipples and breast engorgement. Breastfeed when you feel the need to reduce the fullness of your breasts or when your baby shows signs of hunger. This is called "breastfeeding on demand." Avoid introducing a pacifier to your baby while you are working to establish breastfeeding (the first 4-6 weeks   after your baby is born). After this time you may choose to use a pacifier. Research has shown that pacifier use during the first year of a baby's life decreases the risk of sudden infant death syndrome (SIDS). Allow your baby to feed on each breast as long as he or she wants. Breastfeed until your baby is finished feeding. When your baby unlatches or falls asleep while feeding from the first breast, offer the second breast. Because newborns are often sleepy in the first few weeks of life, you may need to awaken your baby to get him or her to feed. Breastfeeding times will vary from baby to baby. However, the following rules can serve as a guide to help you ensure that your baby is properly fed:  Newborns (babies 4 weeks of age or younger) may breastfeed every 1-3 hours.  Newborns should not go longer than 3 hours  during the day or 5 hours during the night without breastfeeding.  You should breastfeed your baby a minimum of 8 times in a 24-hour period until you begin to introduce solid foods to your baby at around 6 months of age. BREAST MILK PUMPING Pumping and storing breast milk allows you to ensure that your baby is exclusively fed your breast milk, even at times when you are unable to breastfeed. This is especially important if you are going back to work while you are still breastfeeding or when you are not able to be present during feedings. Your lactation consultant can give you guidelines on how long it is safe to store breast milk. A breast pump is a machine that allows you to pump milk from your breast into a sterile bottle. The pumped breast milk can then be stored in a refrigerator or freezer. Some breast pumps are operated by hand, while others use electricity. Ask your lactation consultant which type will work best for you. Breast pumps can be purchased, but some hospitals and breastfeeding support groups lease breast pumps on a monthly basis. A lactation consultant can teach you how to hand express breast milk, if you prefer not to use a pump. CARING FOR YOUR BREASTS WHILE YOU BREASTFEED Nipples can become dry, cracked, and sore while breastfeeding. The following recommendations can help keep your breasts moisturized and healthy:  Avoid using soap on your nipples.  Wear a supportive bra. Although not required, special nursing bras and tank tops are designed to allow access to your breasts for breastfeeding without taking off your entire bra or top. Avoid wearing underwire-style bras or extremely tight bras.  Air dry your nipples for 3-4minutes after each feeding.  Use only cotton bra pads to absorb leaked breast milk. Leaking of breast milk between feedings is normal.  Use lanolin on your nipples after breastfeeding. Lanolin helps to maintain your skin's normal moisture barrier. If you use  pure lanolin, you do not need to wash it off before feeding your baby again. Pure lanolin is not toxic to your baby. You may also hand express a few drops of breast milk and gently massage that milk into your nipples and allow the milk to air dry. In the first few weeks after giving birth, some women experience extremely full breasts (engorgement). Engorgement can make your breasts feel heavy, warm, and tender to the touch. Engorgement peaks within 3-5 days after you give birth. The following recommendations can help ease engorgement:  Completely empty your breasts while breastfeeding or pumping. You may want to start by applying warm, moist heat (in   the shower or with warm water-soaked hand towels) just before feeding or pumping. This increases circulation and helps the milk flow. If your baby does not completely empty your breasts while breastfeeding, pump any extra milk after he or she is finished.  Wear a snug bra (nursing or regular) or tank top for 1-2 days to signal your body to slightly decrease milk production.  Apply ice packs to your breasts, unless this is too uncomfortable for you.  Make sure that your baby is latched on and positioned properly while breastfeeding. If engorgement persists after 48 hours of following these recommendations, contact your health care provider or a lactation consultant. OVERALL HEALTH CARE RECOMMENDATIONS WHILE BREASTFEEDING  Eat healthy foods. Alternate between meals and snacks, eating 3 of each per day. Because what you eat affects your breast milk, some of the foods may make your baby more irritable than usual. Avoid eating these foods if you are sure that they are negatively affecting your baby.  Drink milk, fruit juice, and water to satisfy your thirst (about 10 glasses a day).  Rest often, relax, and continue to take your prenatal vitamins to prevent fatigue, stress, and anemia.  Continue breast self-awareness checks.  Avoid chewing and smoking  tobacco. Chemicals from cigarettes that pass into breast milk and exposure to secondhand smoke may harm your baby.  Avoid alcohol and drug use, including marijuana. Some medicines that may be harmful to your baby can pass through breast milk. It is important to ask your health care provider before taking any medicine, including all over-the-counter and prescription medicine as well as vitamin and herbal supplements. It is possible to become pregnant while breastfeeding. If birth control is desired, ask your health care provider about options that will be safe for your baby. SEEK MEDICAL CARE IF:  You feel like you want to stop breastfeeding or have become frustrated with breastfeeding.  You have painful breasts or nipples.  Your nipples are cracked or bleeding.  Your breasts are red, tender, or warm.  You have a swollen area on either breast.  You have a fever or chills.  You have nausea or vomiting.  You have drainage other than breast milk from your nipples.  Your breasts do not become full before feedings by the fifth day after you give birth.  You feel sad and depressed.  Your baby is too sleepy to eat well.  Your baby is having trouble sleeping.   Your baby is wetting less than 3 diapers in a 24-hour period.  Your baby has less than 3 stools in a 24-hour period.  Your baby's skin or the white part of his or her eyes becomes yellow.   Your baby is not gaining weight by 5 days of age. SEEK IMMEDIATE MEDICAL CARE IF:  Your baby is overly tired (lethargic) and does not want to wake up and feed.  Your baby develops an unexplained fever.   This information is not intended to replace advice given to you by your health care provider. Make sure you discuss any questions you have with your health care provider.   Document Released: 01/13/2005 Document Revised: 10/04/2014 Document Reviewed: 07/07/2012 Elsevier Interactive Patient Education 2016 [employer]  

## 2015-07-11 LAB — WET PREP, GENITAL: Trich, Wet Prep: NONE SEEN

## 2015-07-12 NOTE — H&P (Signed)
  Kristin Love is an 30 y.o. [id number] [redacted]w[redacted]d female.   Chief Complaint: previous C-section HPI: Desires ERLTCS  Past Medical History  Diagnosis Date  . Herpes   . Vaginal Pap smear, abnormal     had colposcopy repeat pap negative    Past Surgical History  Procedure Laterality Date  . Cesarean section      Family History  Problem Relation Age of Onset  . Hypertension Mother   . Hypertension Father   . Diabetes Father    Social History:  reports that she has never smoked. She has never used smokeless tobacco. She reports that she does not drink alcohol or use illicit drugs.    Allergies  Allergen Reactions  . Shellfish Allergy Anaphylaxis  . Fish Allergy Swelling    Raw shrimp    No current facility-administered medications on file prior to encounter.   Current Outpatient Prescriptions on File Prior to Encounter  Medication Sig Dispense Refill  . promethazine (PHENERGAN) 12.5 MG tablet Take 2 tablets (25 mg total) by mouth every 6 (six) hours as needed for nausea or vomiting. (Patient not taking: Reported on 06/06/2015) 20 tablet 0  . promethazine (PHENERGAN) 25 MG suppository Place 1 suppository (25 mg total) rectally every 6 (six) hours as needed for nausea or vomiting (Use if unable to keep pill down). (Patient not taking: Reported on 06/06/2015) 12 each 0    A comprehensive review of systems was negative.  Last menstrual period 10/23/2014, unknown if currently breastfeeding. General appearance: alert, cooperative, appears stated age and moderately obese Head: Normocephalic, without obvious abnormality, atraumatic Neck: supple, symmetrical, trachea midline Lungs: normal effort Heart: regular rate and rhythm Abdomen: soft, non-tender; bowel sounds normal; no masses,  no organomegaly Extremities: extremities normal, atraumatic, no cyanosis or edema Skin: Skin color, texture, turgor normal. No rashes or lesions Neurologic: Grossly normal   Lab Results  Component  Value Date   WBC 10.7* 05/16/2015   HGB 12.8 05/16/2015   HCT 37.2 05/16/2015   MCV 84.5 05/16/2015   PLT 226 05/16/2015         ABO, Rh: O/Positive/-- (12/19 0000)  Antibody: Negative (12/19 0000)  Rubella: !Error!  RPR: NON REAC (04/11 1030)  HBsAg: Negative (12/19 0000)  HIV: NONREACTIVE (04/11 1030)  GBS: Positive (06/08 0000)     Assessment/Plan Active Problems:   Previous cesarean delivery affecting pregnancy, antepartum  For ERLTCS Risks include but are not limited to bleeding, infection, injury to surrounding structures, including bowel, bladder and ureters, blood clots, and death.  Likelihood of success is high.   PRATT,TANYA S 07/12/2015, 11:58 AM

## 2015-07-13 ENCOUNTER — Telehealth: Payer: Self-pay | Admitting: *Deleted

## 2015-07-13 DIAGNOSIS — N760 Acute vaginitis: Principal | ICD-10-CM

## 2015-07-13 DIAGNOSIS — B9689 Other specified bacterial agents as the cause of diseases classified elsewhere: Secondary | ICD-10-CM

## 2015-07-13 MED ORDER — METRONIDAZOLE 500 MG PO TABS: 500.0000 mg | ORAL_TABLET | Freq: Two times a day (BID) | ORAL | Status: DC

## 2015-07-13 NOTE — Telephone Encounter (Signed)
Called patient with results of wet prep, which showed +BV as well as few yeast. Patient stated she was on flagyl for BV when she came in to get the wet prep and finished it 2 days after her visit. Stated she continued to have some vaginal irritation, and itching. She does have a prescription for Terazol cream. I recommended that she start the treatment for yeast infection and see how she is doing at her prenatal visit next week. Patient was agreeable and voiced understanding.

## 2015-07-17 ENCOUNTER — Ambulatory Visit (INDEPENDENT_AMBULATORY_CARE_PROVIDER_SITE_OTHER): Payer: Commercial Managed Care - HMO | Admitting: Advanced Practice Midwife

## 2015-07-17 VITALS — BP 102/71 | HR 108 | Wt 359.0 lb

## 2015-07-17 DIAGNOSIS — R102 Pelvic and perineal pain: Secondary | ICD-10-CM

## 2015-07-17 DIAGNOSIS — O26893 Other specified pregnancy related conditions, third trimester: Secondary | ICD-10-CM

## 2015-07-17 DIAGNOSIS — O26843 Uterine size-date discrepancy, third trimester: Secondary | ICD-10-CM | POA: Diagnosis not present

## 2015-07-17 DIAGNOSIS — O34219 Maternal care for unspecified type scar from previous cesarean delivery: Secondary | ICD-10-CM

## 2015-07-17 LAB — POCT URINALYSIS DIP (DEVICE)
Bilirubin Urine: NEGATIVE
Glucose, UA: NEGATIVE mg/dL
Hgb urine dipstick: NEGATIVE
Ketones, ur: NEGATIVE mg/dL
Leukocytes, UA: NEGATIVE
Nitrite: NEGATIVE
Protein, ur: 30 mg/dL — AB
Specific Gravity, Urine: 1.02 (ref 1.005–1.030)
Urobilinogen, UA: 2 mg/dL — ABNORMAL HIGH (ref 0.0–1.0)
pH: 6.5 (ref 5.0–8.0)

## 2015-07-17 NOTE — Patient Instructions (Signed)
°Pelvic pain (SPD) °Approved by the [employer] °Share ° ° °In this article °What is symphysis pubis dysfunction?  °What are the symptoms of SPD?  °What causes SPD?  °How is SPD diagnosed?  °How is SPD treated?  °What can I do to ease the pain of SPD?  °Will I recover from SPD after I’ve had my baby ?  °Where can I get help and support? °Video  °Your pelvic floor °Finding and exercising this crucial area. What is symphysis pubis dysfunction?Symphysis pubis dysfunction (SPD) is a problem with the pelvis. Your pelvis is mainly formed of two pubic bones that curve round to make a cradle shape. The pubic bones meet at the front of your pelvis, at a firm joint called the symphysis pubis.  ° °The joint's connection is made strong by a dense network of tough tissues (ligaments). During pregnancy, swelling and pain can make the symphysis pubis joint less stable, causing SPD. ° °Doctors and physiotherapists classify any type of pelvic pain during pregnancy as pelvic girdle pain (PGP).  ° °SPD is one type of pelvic girdle pain. Diastasis symphysis pubis (DSP) is another type of pelvic girdle pain, which is related to SPD. DSP happens when the gap in the symphysis pubis joint widens too far. DSP is rare, and can only be diagnosed by an X-ray, ultrasound scan or MRI scan. What are the symptoms of SPD? Pain in the pubic area and groin are the most common symptoms, though you may also notice:  ° °Back pain, pain at the back of your pelvis or hip pain.  °Pain, along with a grinding or clicking sensation in your pubic area.  °Pain down the inside of your thighs or between your legs.  °Pain that's made worse by parting your legs, walking, going up or down stairs or moving around in bed.  °Pain that's worse at night and stops you from sleeping well. Getting up to go to the toilet in the middle of the night can be especially painful. ° °SPD can occur at any time during your pregnancy or after giving birth. You  may notice it for the first time during the middle of your pregnancy. What causes SPD?During pregnancy, your body produces a hormone called relaxin, which softens your ligaments to help your baby pass through your pelvis. This means that the joints in your pelvis naturally become more lax.  ° °However, this flexibility doesn't necessarily cause the painful problems of SPD. Usually, your nerves and muscles are able to adapt and compensate for the greater flexibility in your joints. This means your body should cope well with the changes to your posture as your baby grows. ° °SPD is thought to happen when your body doesn't adapt so well to the stretchier, looser ligaments caused by relaxin. SPD can be triggered by: ° °the joints in your pelvis moving unevenly  °changes to the way your muscles work to support your pelvic girdle joints  °one pelvic joint not working properly and causing knock-on pain in the other joints of your pelvis ° °These problems mean that your pelvis is not as stable as it should be, and this is what causes SPD. Physiotherapy is the best way to treat SPD, because it's about the relationship between your muscles and bones, rather than how lax your joints are. You're more likely to develop SPD if: ° °you had pelvic girdle pain or pelvic joint pain before you became pregnant  °you've had a previous injury to your   pelvis  °you've had pelvic girdle pain in a previous pregnancy  °you have a high BMI and were overweight before you became pregnant  °hypermobility in all your joints  °How is SPD diagnosed? Your doctor or midwife should refer you to a women’s health physiotherapist. Your physiotherapist will test the stability, movement and pain in your pelvic joints and muscles. How is SPD treated?SPD is managed in the same way as other pelvic girdle pain. Treatment includes:  ° °Exercises to strengthen your spinal, tummy, pelvic girdle, hip and pelvic floor muscles. These will improve the stability of  your pelvis and back. You may need gentle, hands-on treatment of your hip, back or pelvis to correct stiffness or imbalance. Water gymnastics can sometimes help.  °Your physiotherapist should advise you on how to make daily activities less painful and on how to make the birth of your baby easier. Your midwife should help you to write a birth plan that takes into account your SPD symptoms.  °Acupuncture may help reduce the pain and is safe during pregnancy. Make sure your practitioner is trained and experienced in working with pregnant women.  °Other manual therapies, such as osteopathy may help. See a registered practitioner who is experienced in treating pregnant women.  °A pelvic support belt may give relief, particularly when you're exercising or active. °What can I do to ease the pain of SPD?  °Be as active as you can, but don't push yourself so far that it hurts.  °Stick to the pelvic floor and tummy exercises that your physiotherapist recommends.  °Ask for and accept offers of help with daily chores.  °Plan ahead so that you reduce the activities that cause you problems. You could use a rucksack to carry things around, both indoors and out.  °Take care to part your legs no further than your pain-free range, particularly when getting in and out of the car, bed or bath. If you are lying down, pull up your knees as far as you can to make it easier to part your legs. If you are sitting, try arching your back and sticking your chest out before parting or moving your legs.  °Avoid activities that make your pain worse or that put your pelvis in an uneven position, such as sitting cross-legged or carrying your toddler on your hip. If something hurts, stop doing it. If the pain is allowed to flare up, it can take a long time to settle down again.  °Try to sleep on your side with legs bent and a pillow between your knees.  °Rest regularly or sit down for activities you would normally do standing, such as ironing. By  sitting on a birth ball or by getting down on your hands and knees, you'll take the weight of your baby off your pelvis.  °Try not to do heavy lifting or pushing. Pushing supermarket trolleys can often make your pain worse, so shop online or ask someone to shop for you.  °When climbing stairs, take one step at a time. Step up onto one step with your best leg and then bring your other leg to meet it. Repeat with each step.  °Avoid standing on one leg. When getting dressed, sit down to pull on your knickers or trousers. °Will I recover from SPD after I’ve had my baby ? You’re very likely to recover within a few weeks to a few months after your baby is born. If you can, carry on with physiotherapy after the birth. Try to get   help with looking after your baby during the early weeks.  ° °You may find you get twinges every month just before your period is due. This is likely to be caused by hormones that have a similar effect to pregnancy hormones.  ° °If you have SPD in one pregnancy, it is more likely that you’ll have it next time you get pregnant. Ask your midwife to refer you to a physiotherapist early on. SPD may not necessarily be as bad next time if it is well managed from the start of pregnancy.  ° °You could consider giving yourself a bit of time from one pregnancy to the next. Losing excess weight, getting fit and waiting until your children can walk may help reduce the symptoms of getting any type of pelvic pain next time. Where can I get help and support?The [employer] of Chartered [employer] in Women’s [employer] can provide a list of physiotherapists in your area. ° °You can get in touch with other women in your situation by contacting [facility], a charity that offers support to women with pelvic girdle pain, including SPD, or by visiting our community.  ° °See our photo guide to pregnancy stretches, designed to help relieve those aches and pains.  ° °Last reviewed: July 2015 °

## 2015-07-17 NOTE — Progress Notes (Signed)
Subjective:  Kristin Love is a 30 y.o. [phone] at [redacted]w[redacted]d being seen today for ongoing prenatal care.  She is currently monitored for the following issues for this low-risk pregnancy and has OBESITY, NOS; Supervision of low-risk pregnancy; Previous cesarean delivery affecting pregnancy, antepartum; Genital herpes affecting pregnancy, antepartum; and Group B Streptococcus carrier, +RV culture, currently pregnant on her problem list.  Patient reports constant pelvic pain but no intermittent cramping/contractions.  Contractions: Not present. Vag. Bleeding: None.  Movement: Present. Denies leaking of fluid.   The following portions of the patient's history were reviewed and updated as appropriate: allergies, current medications, past family history, past medical history, past social history, past surgical history and problem list. Problem list updated.  Objective:   Filed Vitals:   07/17/15 0946  BP: 102/71  Pulse: 108  Weight: 359 lb (162.841 kg)    Fetal Status: Fetal Heart Rate (bpm): 150   Movement: Present     General:  Alert, oriented and cooperative. Patient is in no acute distress.  Skin: Skin is warm and dry. No rash noted.   Cardiovascular: Normal heart rate noted  Respiratory: Normal respiratory effort, no problems with respiration noted  Abdomen: Soft, gravid, appropriate for gestational age. Pain/Pressure: Present     Pelvic: Cervical exam deferred        Extremities: Normal range of motion.  Edema: None  Mental Status: Normal mood and affect. Normal behavior. Normal judgment and thought content.   Urinalysis: Urine Protein: 1+ Urine Glucose: Negative  Assessment and Plan:  Pregnancy: G4P1021 at [redacted]w[redacted]d  1. Uterine size date discrepancy pregnancy, third trimester --[place] on 6/7 showed 81%tile.  Previous baby >9 lbs.     2. Previous cesarean delivery affecting pregnancy, antepartum --Repeat C/S schedule on 6/27.  3. Pelvic pain affecting pregnancy in third trimester,  antepartum --Rest/ice/heat/warm bath/Tylenol for pain.  Pt tolerating well.    Term labor symptoms and general obstetric precautions including but not limited to vaginal bleeding, contractions, leaking of fluid and fetal movement were reviewed in detail with the patient. Please refer to After Visit Summary for other counseling recommendations.  No Follow-up on file.   Elvera Maria, CNM

## 2015-07-20 NOTE — Patient Instructions (Addendum)
20 Kristin Love  07/20/2015   Your procedure is scheduled on:  07/24/15  Enter through the Main Entrance of [facility] at [place] up the phone at the desk and dial [phone].   Call this number if you have problems the morning of surgery: [phone]   Remember:   Do not eat food:After Midnight.  Do not drink clear liquids: After Midnight.  Take these medicines the morning of surgery with A SIP OF WATER: Valtrex 500mg    Do not wear jewelry, make-up or nail polish.  Do not wear lotions, powders, or perfumes. You may wear deodorant.  Do not shave 48 hours prior to surgery.  Do not bring valuables to the hospital.  [facility] is not   responsible for any belongings or valuables brought to the hospital.  Contacts, dentures or bridgework may not be worn into surgery.  Leave suitcase in the car. After surgery it may be brought to your room.  For patients admitted to the hospital, checkout time is 11:00 AM the day of              discharge.   Patients discharged the day of surgery will not be allowed to drive             home.  Name and phone number of your driver: na  Special Instructions:   Shower using CHG 2 nights before surgery and the night before surgery.  If you shower the day of surgery use CHG.  Use special wash - you have one bottle of CHG for all showers.  You should use approximately 1/3 of the bottle for each shower.   Please read over the following fact sheets that you were given:   Surgical Site Infection Prevention

## 2015-07-23 ENCOUNTER — Encounter (HOSPITAL_COMMUNITY)
Admission: RE | Admit: 2015-07-23 | Discharge: 2015-07-23 | Disposition: A | Discharge status: Home / Self Care | Payer: Medicaid Other | Source: Ambulatory Visit | Attending: Family Medicine | Admitting: Family Medicine

## 2015-07-23 LAB — CBC
HCT: 35 % — ABNORMAL LOW (ref 36.0–46.0)
Hemoglobin: 11.7 g/dL — ABNORMAL LOW (ref 12.0–15.0)
MCH: 27.7 pg (ref 26.0–34.0)
MCHC: 33.4 g/dL (ref 30.0–36.0)
MCV: 82.9 fL (ref 78.0–100.0)
Platelets: 211 10*3/uL (ref 150–400)
RBC: 4.22 MIL/uL (ref 3.87–5.11)
RDW: 15 % (ref 11.5–15.5)
WBC: 7.4 10*3/uL (ref 4.0–10.5)

## 2015-07-23 MED ORDER — DEXTROSE 5 % IV SOLN
3.0000 g | INTRAVENOUS | Status: AC
  Administered 2015-07-24: 3 g via INTRAVENOUS
  Filled 2015-07-23: qty 3000

## 2015-07-24 ENCOUNTER — Inpatient Hospital Stay (HOSPITAL_COMMUNITY)
Admission: RE | Admit: 2015-07-24 | Discharge: 2015-07-26 | DRG: 765 | Disposition: A | Discharge status: Home / Self Care | Payer: Medicaid Other | Source: Ambulatory Visit | Attending: Family Medicine | Admitting: Family Medicine

## 2015-07-24 ENCOUNTER — Encounter (HOSPITAL_COMMUNITY)
Admission: RE | Disposition: A | Discharge status: Home / Self Care | Payer: Self-pay | Source: Ambulatory Visit | Attending: Family Medicine

## 2015-07-24 ENCOUNTER — Encounter (HOSPITAL_COMMUNITY): Payer: Self-pay | Admitting: Anesthesiology

## 2015-07-24 ENCOUNTER — Inpatient Hospital Stay (HOSPITAL_COMMUNITY): Payer: Medicaid Other | Admitting: Anesthesiology

## 2015-07-24 DIAGNOSIS — Z6841 Body Mass Index (BMI) 40.0 and over, adult: Secondary | ICD-10-CM

## 2015-07-24 DIAGNOSIS — O99824 Streptococcus B carrier state complicating childbirth: Secondary | ICD-10-CM | POA: Diagnosis present

## 2015-07-24 DIAGNOSIS — O99214 Obesity complicating childbirth: Secondary | ICD-10-CM | POA: Diagnosis present

## 2015-07-24 DIAGNOSIS — A6000 Herpesviral infection of urogenital system, unspecified: Secondary | ICD-10-CM | POA: Diagnosis present

## 2015-07-24 DIAGNOSIS — O34211 Maternal care for low transverse scar from previous cesarean delivery: Principal | ICD-10-CM | POA: Diagnosis present

## 2015-07-24 DIAGNOSIS — A6009 Herpesviral infection of other urogenital tract: Secondary | ICD-10-CM

## 2015-07-24 DIAGNOSIS — O98319 Other infections with a predominantly sexual mode of transmission complicating pregnancy, unspecified trimester: Secondary | ICD-10-CM | POA: Diagnosis present

## 2015-07-24 DIAGNOSIS — Z3A37 37 weeks gestation of pregnancy: Secondary | ICD-10-CM

## 2015-07-24 DIAGNOSIS — O34219 Maternal care for unspecified type scar from previous cesarean delivery: Secondary | ICD-10-CM

## 2015-07-24 DIAGNOSIS — O99820 Streptococcus B carrier state complicating pregnancy: Secondary | ICD-10-CM

## 2015-07-24 DIAGNOSIS — Z3A39 39 weeks gestation of pregnancy: Secondary | ICD-10-CM | POA: Diagnosis not present

## 2015-07-24 DIAGNOSIS — Z3490 Encounter for supervision of normal pregnancy, unspecified, unspecified trimester: Secondary | ICD-10-CM

## 2015-07-24 DIAGNOSIS — Z98891 History of uterine scar from previous surgery: Secondary | ICD-10-CM

## 2015-07-24 DIAGNOSIS — O9832 Other infections with a predominantly sexual mode of transmission complicating childbirth: Secondary | ICD-10-CM | POA: Diagnosis present

## 2015-07-24 LAB — RPR: RPR Ser Ql: NONREACTIVE

## 2015-07-24 LAB — PREPARE RBC (CROSSMATCH)

## 2015-07-24 SURGERY — Surgical Case
Anesthesia: Spinal | Site: Abdomen | Wound class: Clean Contaminated

## 2015-07-24 MED ORDER — SCOPOLAMINE 1 MG/3DAYS TD PT72
1.0000 | MEDICATED_PATCH | Freq: Once | TRANSDERMAL | Status: DC
  Administered 2015-07-24: 1.5 mg via TRANSDERMAL

## 2015-07-24 MED ORDER — PHENYLEPHRINE HCL 10 MG/ML IJ SOLN
INTRAMUSCULAR | Status: DC | PRN
  Administered 2015-07-24: 80 ug via INTRAVENOUS
  Administered 2015-07-24 (×2): 120 ug via INTRAVENOUS

## 2015-07-24 MED ORDER — SIMETHICONE 80 MG PO CHEW
80.0000 mg | CHEWABLE_TABLET | Freq: Three times a day (TID) | ORAL | Status: DC
  Administered 2015-07-24 – 2015-07-26 (×5): 80 mg via ORAL
  Filled 2015-07-24 (×5): qty 1

## 2015-07-24 MED ORDER — MORPHINE SULFATE (PF) 0.5 MG/ML IJ SOLN
INTRAMUSCULAR | Status: DC | PRN
  Administered 2015-07-24: .2 mg via INTRATHECAL

## 2015-07-24 MED ORDER — ACETAMINOPHEN 325 MG PO TABS: 650.0000 mg | ORAL_TABLET | ORAL | Status: DC | PRN

## 2015-07-24 MED ORDER — SODIUM CHLORIDE 0.9% FLUSH: 3.0000 mL | INTRAVENOUS | Status: DC | PRN

## 2015-07-24 MED ORDER — NALBUPHINE HCL 10 MG/ML IJ SOLN
5.0000 mg | INTRAMUSCULAR | Status: DC | PRN
  Administered 2015-07-24: 5 mg via SUBCUTANEOUS

## 2015-07-24 MED ORDER — MEPERIDINE HCL 25 MG/ML IJ SOLN: 6.2500 mg | INTRAMUSCULAR | Status: DC | PRN

## 2015-07-24 MED ORDER — DEXAMETHASONE SODIUM PHOSPHATE 4 MG/ML IJ SOLN
INTRAMUSCULAR | Status: DC | PRN
  Administered 2015-07-24: 4 mg via INTRAVENOUS

## 2015-07-24 MED ORDER — OXYTOCIN 40 UNITS IN LACTATED RINGERS INFUSION - SIMPLE MED: 2.5000 [IU]/h | INTRAVENOUS | Status: AC

## 2015-07-24 MED ORDER — NALBUPHINE HCL 10 MG/ML IJ SOLN: 5.0000 mg | INTRAMUSCULAR | Status: DC | PRN

## 2015-07-24 MED ORDER — ONDANSETRON HCL 4 MG/2ML IJ SOLN: 4.0000 mg | Freq: Three times a day (TID) | INTRAMUSCULAR | Status: DC | PRN

## 2015-07-24 MED ORDER — MENTHOL 3 MG MT LOZG: 1.0000 | LOZENGE | OROMUCOSAL | Status: DC | PRN

## 2015-07-24 MED ORDER — KETOROLAC TROMETHAMINE 30 MG/ML IJ SOLN: 30.0000 mg | Freq: Once | INTRAMUSCULAR | Status: DC

## 2015-07-24 MED ORDER — IBUPROFEN 600 MG PO TABS
600.0000 mg | ORAL_TABLET | Freq: Four times a day (QID) | ORAL | Status: DC
  Administered 2015-07-24 – 2015-07-26 (×8): 600 mg via ORAL
  Filled 2015-07-24 (×9): qty 1

## 2015-07-24 MED ORDER — COCONUT OIL OIL: 1.0000 "application " | TOPICAL_OIL | Status: DC | PRN

## 2015-07-24 MED ORDER — TETANUS-DIPHTH-ACELL PERTUSSIS 5-2.5-18.5 LF-MCG/0.5 IM SUSP: 0.5000 mL | Freq: Once | INTRAMUSCULAR | Status: DC

## 2015-07-24 MED ORDER — IBUPROFEN 600 MG PO TABS
600.0000 mg | ORAL_TABLET | Freq: Four times a day (QID) | ORAL | Status: DC | PRN
  Administered 2015-07-26 (×2): 600 mg via ORAL

## 2015-07-24 MED ORDER — PROMETHAZINE HCL 25 MG/ML IJ SOLN: 12.5000 mg | Freq: Once | INTRAMUSCULAR | Status: DC | PRN

## 2015-07-24 MED ORDER — KETOROLAC TROMETHAMINE 30 MG/ML IJ SOLN
INTRAMUSCULAR | Status: AC
  Filled 2015-07-24: qty 1

## 2015-07-24 MED ORDER — PHENYLEPHRINE 8 MG IN D5W 100 ML (0.08MG/ML) PREMIX OPTIME
INJECTION | INTRAVENOUS | Status: DC | PRN
  Administered 2015-07-24: 60 ug/min via INTRAVENOUS

## 2015-07-24 MED ORDER — PHENYLEPHRINE 40 MCG/ML (10ML) SYRINGE FOR IV PUSH (FOR BLOOD PRESSURE SUPPORT)
PREFILLED_SYRINGE | INTRAVENOUS | Status: AC
  Filled 2015-07-24: qty 10

## 2015-07-24 MED ORDER — NALBUPHINE HCL 10 MG/ML IJ SOLN: 5.0000 mg | Freq: Once | INTRAMUSCULAR | Status: DC | PRN

## 2015-07-24 MED ORDER — DIPHENHYDRAMINE HCL 25 MG PO CAPS: 25.0000 mg | ORAL_CAPSULE | ORAL | Status: DC | PRN

## 2015-07-24 MED ORDER — KETOROLAC TROMETHAMINE 30 MG/ML IJ SOLN
30.0000 mg | Freq: Four times a day (QID) | INTRAMUSCULAR | Status: DC | PRN
  Administered 2015-07-24: 30 mg via INTRAMUSCULAR

## 2015-07-24 MED ORDER — OXYCODONE HCL 5 MG PO TABS
10.0000 mg | ORAL_TABLET | ORAL | Status: DC | PRN
  Administered 2015-07-26: 10 mg via ORAL
  Filled 2015-07-24: qty 2

## 2015-07-24 MED ORDER — ACETAMINOPHEN 500 MG PO TABS
1000.0000 mg | ORAL_TABLET | Freq: Four times a day (QID) | ORAL | Status: AC
  Administered 2015-07-24 – 2015-07-25 (×3): 1000 mg via ORAL
  Filled 2015-07-24 (×3): qty 2

## 2015-07-24 MED ORDER — DIBUCAINE 1 % RE OINT: 1.0000 "application " | TOPICAL_OINTMENT | RECTAL | Status: DC | PRN

## 2015-07-24 MED ORDER — SIMETHICONE 80 MG PO CHEW: 80.0000 mg | CHEWABLE_TABLET | ORAL | Status: DC | PRN

## 2015-07-24 MED ORDER — HYDROMORPHONE HCL 1 MG/ML IJ SOLN: 0.2500 mg | INTRAMUSCULAR | Status: DC | PRN

## 2015-07-24 MED ORDER — NALOXONE HCL 0.4 MG/ML IJ SOLN: 0.4000 mg | INTRAMUSCULAR | Status: DC | PRN

## 2015-07-24 MED ORDER — LACTATED RINGERS IV SOLN: INTRAVENOUS | Status: DC

## 2015-07-24 MED ORDER — BUPIVACAINE HCL (PF) 0.25 % IJ SOLN
INTRAMUSCULAR | Status: AC
  Filled 2015-07-24: qty 10

## 2015-07-24 MED ORDER — BUPIVACAINE HCL (PF) 0.25 % IJ SOLN
INTRAMUSCULAR | Status: DC | PRN
  Administered 2015-07-24: 30 mL

## 2015-07-24 MED ORDER — FENTANYL CITRATE (PF) 100 MCG/2ML IJ SOLN
INTRAMUSCULAR | Status: DC | PRN
  Administered 2015-07-24: 10 ug via INTRATHECAL

## 2015-07-24 MED ORDER — FENTANYL CITRATE (PF) 100 MCG/2ML IJ SOLN
INTRAMUSCULAR | Status: AC
  Filled 2015-07-24: qty 2

## 2015-07-24 MED ORDER — SENNOSIDES-DOCUSATE SODIUM 8.6-50 MG PO TABS
2.0000 | ORAL_TABLET | ORAL | Status: DC
  Administered 2015-07-25 (×2): 2 via ORAL
  Filled 2015-07-24 (×2): qty 2

## 2015-07-24 MED ORDER — OXYTOCIN 10 UNIT/ML IJ SOLN
40.0000 [IU] | INTRAVENOUS | Status: DC | PRN
  Administered 2015-07-24: 40 [IU] via INTRAVENOUS

## 2015-07-24 MED ORDER — DIPHENHYDRAMINE HCL 50 MG/ML IJ SOLN
INTRAMUSCULAR | Status: DC | PRN
  Administered 2015-07-24: 25 mg via INTRAVENOUS

## 2015-07-24 MED ORDER — ONDANSETRON HCL 4 MG/2ML IJ SOLN
INTRAMUSCULAR | Status: DC | PRN
  Administered 2015-07-24: 4 mg via INTRAVENOUS

## 2015-07-24 MED ORDER — BUPIVACAINE HCL (PF) 0.25 % IJ SOLN
INTRAMUSCULAR | Status: AC
  Filled 2015-07-24: qty 20

## 2015-07-24 MED ORDER — MORPHINE SULFATE (PF) 0.5 MG/ML IJ SOLN
INTRAMUSCULAR | Status: AC
Start: 2015-07-24 — End: 2015-07-24
  Filled 2015-07-24: qty 10

## 2015-07-24 MED ORDER — PHENYLEPHRINE 8 MG IN D5W 100 ML (0.08MG/ML) PREMIX OPTIME
INJECTION | INTRAVENOUS | Status: AC
  Filled 2015-07-24: qty 100

## 2015-07-24 MED ORDER — NALBUPHINE HCL 10 MG/ML IJ SOLN
INTRAMUSCULAR | Status: AC
  Filled 2015-07-24: qty 1

## 2015-07-24 MED ORDER — ONDANSETRON HCL 4 MG/2ML IJ SOLN
INTRAMUSCULAR | Status: AC
  Filled 2015-07-24: qty 2

## 2015-07-24 MED ORDER — OXYTOCIN 10 UNIT/ML IJ SOLN
INTRAMUSCULAR | Status: AC
  Filled 2015-07-24: qty 4

## 2015-07-24 MED ORDER — DIPHENHYDRAMINE HCL 50 MG/ML IJ SOLN
INTRAMUSCULAR | Status: AC
  Filled 2015-07-24: qty 1

## 2015-07-24 MED ORDER — ZOLPIDEM TARTRATE 5 MG PO TABS: 5.0000 mg | ORAL_TABLET | Freq: Every evening | ORAL | Status: DC | PRN

## 2015-07-24 MED ORDER — KETOROLAC TROMETHAMINE 30 MG/ML IJ SOLN: 30.0000 mg | Freq: Four times a day (QID) | INTRAMUSCULAR | Status: DC | PRN

## 2015-07-24 MED ORDER — LACTATED RINGERS IV SOLN
INTRAVENOUS | Status: DC
  Administered 2015-07-24 (×3): via INTRAVENOUS

## 2015-07-24 MED ORDER — LACTATED RINGERS IV SOLN
INTRAVENOUS | Status: DC
  Administered 2015-07-24: 10:00:00 via INTRAVENOUS

## 2015-07-24 MED ORDER — WITCH HAZEL-GLYCERIN EX PADS: 1.0000 "application " | MEDICATED_PAD | CUTANEOUS | Status: DC | PRN

## 2015-07-24 MED ORDER — LACTATED RINGERS IV SOLN
INTRAVENOUS | Status: DC
  Administered 2015-07-24: 18:00:00 via INTRAVENOUS

## 2015-07-24 MED ORDER — SCOPOLAMINE 1 MG/3DAYS TD PT72
1.0000 | MEDICATED_PATCH | Freq: Once | TRANSDERMAL | Status: DC
  Filled 2015-07-24: qty 1

## 2015-07-24 MED ORDER — DEXAMETHASONE SODIUM PHOSPHATE 4 MG/ML IJ SOLN
INTRAMUSCULAR | Status: AC
  Filled 2015-07-24: qty 1

## 2015-07-24 MED ORDER — BUPIVACAINE IN DEXTROSE 0.75-8.25 % IT SOLN
INTRATHECAL | Status: DC | PRN
  Administered 2015-07-24: 1.4 mL via INTRATHECAL

## 2015-07-24 MED ORDER — SIMETHICONE 80 MG PO CHEW
80.0000 mg | CHEWABLE_TABLET | ORAL | Status: DC
  Administered 2015-07-25 (×2): 80 mg via ORAL
  Filled 2015-07-24 (×2): qty 1

## 2015-07-24 MED ORDER — DIPHENHYDRAMINE HCL 50 MG/ML IJ SOLN: 12.5000 mg | INTRAMUSCULAR | Status: DC | PRN

## 2015-07-24 MED ORDER — OXYCODONE HCL 5 MG PO TABS
5.0000 mg | ORAL_TABLET | ORAL | Status: DC | PRN
  Administered 2015-07-25 – 2015-07-26 (×5): 5 mg via ORAL
  Filled 2015-07-24 (×5): qty 1

## 2015-07-24 MED ORDER — SODIUM CHLORIDE 0.9 % IR SOLN
Status: DC | PRN
  Administered 2015-07-24: 1000 mL

## 2015-07-24 MED ORDER — NALOXONE HCL 2 MG/2ML IJ SOSY
1.0000 ug/kg/h | PREFILLED_SYRINGE | INTRAVENOUS | Status: DC | PRN
  Filled 2015-07-24: qty 2

## 2015-07-24 MED ORDER — DIPHENHYDRAMINE HCL 25 MG PO CAPS: 25.0000 mg | ORAL_CAPSULE | Freq: Four times a day (QID) | ORAL | Status: DC | PRN

## 2015-07-24 MED ORDER — SCOPOLAMINE 1 MG/3DAYS TD PT72
MEDICATED_PATCH | TRANSDERMAL | Status: AC
  Filled 2015-07-24: qty 1

## 2015-07-24 SURGICAL SUPPLY — 27 items
CLAMP CORD UMBIL (MISCELLANEOUS) IMPLANT
CLOTH BEACON ORANGE TIMEOUT ST (SAFETY) ×2 IMPLANT
DRESSING DISP NPWT PICO 4X12 (MISCELLANEOUS) ×2 IMPLANT
DRSG OPSITE POSTOP 4X10 (GAUZE/BANDAGES/DRESSINGS) IMPLANT
DURAPREP 26ML APPLICATOR (WOUND CARE) ×2 IMPLANT
ELECT REM PT RETURN 9FT ADLT (ELECTROSURGICAL) ×2
ELECTRODE REM PT RTRN 9FT ADLT (ELECTROSURGICAL) ×1 IMPLANT
EXTRACTOR VACUUM M CUP 4 TUBE (SUCTIONS) IMPLANT
GLOVE BIOGEL PI IND STRL 7.0 (GLOVE) ×2 IMPLANT
GLOVE BIOGEL PI INDICATOR 7.0 (GLOVE) ×2
GLOVE ECLIPSE 7.0 STRL STRAW (GLOVE) ×4 IMPLANT
GOWN STRL REUS W/TWL LRG LVL3 (GOWN DISPOSABLE) ×4 IMPLANT
KIT ABG SYR 3ML LUER SLIP (SYRINGE) IMPLANT
NEEDLE HYPO 22GX1.5 SAFETY (NEEDLE) ×2 IMPLANT
NEEDLE HYPO 25X5/8 SAFETYGLIDE (NEEDLE) IMPLANT
NS IRRIG 1000ML POUR BTL (IV SOLUTION) ×2 IMPLANT
PACK C SECTION WH (CUSTOM PROCEDURE TRAY) ×2 IMPLANT
PAD OB MATERNITY 4.3X12.25 (PERSONAL CARE ITEMS) ×2 IMPLANT
PENCIL SMOKE EVAC W/HOLSTER (ELECTROSURGICAL) ×2 IMPLANT
RTRCTR C-SECT PINK 25CM LRG (MISCELLANEOUS) ×2 IMPLANT
SUT PLAIN 2 0 XLH (SUTURE) ×2 IMPLANT
SUT VIC AB 0 CTX 36 (SUTURE) ×3
SUT VIC AB 0 CTX36XBRD ANBCTRL (SUTURE) ×3 IMPLANT
SUT VIC AB 4-0 KS 27 (SUTURE) ×2 IMPLANT
SYR 30ML LL (SYRINGE) ×2 IMPLANT
TOWEL OR 17X24 6PK STRL BLUE (TOWEL DISPOSABLE) ×2 IMPLANT
TRAY FOLEY CATH SILVER 14FR (SET/KITS/TRAYS/PACK) ×2 IMPLANT

## 2015-07-24 NOTE — Op Note (Signed)
Cesarean Section Operative Report  PRICELLA VOSKUIL  07/24/2015  Indications: Scheduled Proceedure/Maternal Request   Pre-operative Diagnosis: PREVIOUS CESAREAN SECTION, ELECTIVE REPEAT.   Post-operative Diagnosis: Same   Surgeon: Surgeon(s) and Role:    * Gwynne Edinger, MD - Assisting    * Donnamae Jude, MD - Primary   Attending Attestation: I was present and scrubbed for the entire procedure.   Anesthesia: spinal    Estimated Blood Loss: 1000 ml  Total IV Fluids: 3700 ml LR  Urine Output:: 100 ml clear yellow urine  Specimens: none  Findings: Viable female infant in cephalic presentation; Apgars 8/9; weight 3785 g; arterial cord pH not obtained; clear amniotic fluid; intact placenta with three vessel cord; normal uterus, fallopian tubes and ovaries bilaterally. No significant adhesive disease  Baby condition / location:  Couplet care / Skin to Skin   Complications: no complications  Indications: ARTHUR DECOOK is a 31 y.o. [phone] with an IUP [redacted]w[redacted]d presenting for elective repeat c/s.  The risks, benefits, complications, treatment options, and expected outcomes were discussed with the patient . The patient concurred with the proposed plan, giving informed consent. identified as Hildred Alamin and the procedure verified as C-Section Delivery.  Procedure Details:  The patient was taken back to the operative suite where spinal anesthesia was placed.  A time out was held and the above information confirmed.   After induction of anesthesia, the patient was draped and prepped in the usual sterile manner and placed in a dorsal supine position with a leftward tilt. A Pfannenstiel incision was made and carried down through the subcutaneous tissue to the fascia. Fascial incision was made and sharply extended transversely. The fascia was separated from the underlying rectus tissue superiorly and inferiorly. The peritoneum was identified and sharply entered and extended  longitudinally. Alexis retractor was placed. A low transverse uterine incision was made and extended bluntly. Delivered from cephalic presentation was a viable infant with Apgars and weight as above. After delaying cord cutting for 60 seconds, the umbilical cord was clamped and cut cord blood was obtained for evaluation. Cord ph was not sent. The placenta was removed Intact and appeared normal. The uterine outline, tubes and ovaries appeared normal. The uterine incision was closed with running locked sutures of 0Vicryl in a single layer.   Hemostasis was observed. The peritoneum was closed with 0 vicryl. The rectus muscles were examined and hemostasis observed. The fascia was then reapproximated with running sutures of 0Vicryl. A total of 30 ml 0.25% Marcaine was injected subcutaneously at the margins of the incision. The subcuticular closure was performed using 2-0plain gut. The skin was closed with 4-0Vicryl. PICO dressing was applied.  Instrument, sponge, and needle counts were correct prior the abdominal closure and were correct at the conclusion of the case.     Disposition: PACU - hemodynamically stable.   Maternal Condition: stable       Signed: Ennis Forts 07/24/2015 10:39 AM

## 2015-07-24 NOTE — Anesthesia Preprocedure Evaluation (Signed)
Anesthesia Evaluation  Patient identified by MRN, date of birth, ID band Patient awake    Reviewed: Allergy & Precautions, H&P , NPO status , Patient's Chart, lab work & pertinent test results  Airway Mallampati: I  TM Distance: >3 FB Neck ROM: full    Dental no notable dental hx.    Pulmonary neg pulmonary ROS,    Pulmonary exam normal        Cardiovascular negative cardio ROS Normal cardiovascular exam     Neuro/Psych negative neurological ROS  negative psych ROS   GI/Hepatic negative GI ROS, Neg liver ROS,   Endo/Other  Morbid obesity  Renal/GU negative Renal ROS     Musculoskeletal   Abdominal   Peds  Hematology negative hematology ROS (+)   Anesthesia Other Findings   Reproductive/Obstetrics (+) Pregnancy                             Anesthesia Physical Anesthesia Plan  ASA: III  Anesthesia Plan: Spinal   Post-op Pain Management:    Induction:   Airway Management Planned:   Additional Equipment:   Intra-op Plan:   Post-operative Plan:   Informed Consent: I have reviewed the patients History and Physical, chart, labs and discussed the procedure including the risks, benefits and alternatives for the proposed anesthesia with the patient or authorized representative who has indicated his/her understanding and acceptance.     Plan Discussed with: CRNA and Surgeon  Anesthesia Plan Comments:         Anesthesia Quick Evaluation

## 2015-07-24 NOTE — Anesthesia Postprocedure Evaluation (Signed)
Anesthesia Post Note  Patient: Kristin Love  Procedure(s) Performed: Procedure(s) (LRB): CESAREAN SECTION (N/A)  Patient location during evaluation: PACU Anesthesia Type: Spinal Pain management: pain level controlled Vital Signs Assessment: post-procedure vital signs reviewed and stable Respiratory status: spontaneous breathing Cardiovascular status: stable Postop Assessment: no headache, no backache, spinal receding, patient able to bend at knees and no signs of nausea or vomiting Anesthetic complications: no     Last Vitals:  Filed Vitals:   07/24/15 1100 07/24/15 1115  BP: 127/92 124/66  Pulse: 80 83  Temp:    Resp: 20 20    Last Pain: There were no vitals filed for this visit. Pain Goal: Patients Stated Pain Goal: 4 (07/24/15 0753)               [place]

## 2015-07-24 NOTE — Lactation Note (Signed)
This note was copied from a baby's chart. Lactation Consultation Note  Patient Name: Kristin Love [id number] Date: 07/24/2015 Reason for consult: Follow-up assessment Baby at 11 hr of life and mom was requesting help with latch. Baby has a tight thick upper labial frenulum with a notched insertion point on the lower gum ridge. Baby has a very hard time flanging the top lip and does a lot of lip sucking. She does not open her mouth very wide. Mom has large pendulous breast with short shaft nipples. Baby is tongue thrusting and biting down. Baby was not assessed for lingual frenulum at this time. Applied #20 NS and baby was able to pull the nipple in but pushes nipple out after 3-4 sucks. Even with the NS baby has a hard time flanging the top lip. Mom can easily express large drops of colostrum and has spoon at bedside. Discussed suck training with mom and keep trying to latch baby without the NS. She will continue to offer the breast on demand 8+/24hr and call for help as needed.   Maternal Data Has patient been taught Hand Expression?: Yes  Feeding Feeding Type: Breast Fed Length of feed: 20 min  LATCH Score/Interventions Latch: Repeated attempts needed to sustain latch, nipple held in mouth throughout feeding, stimulation needed to elicit sucking reflex. Intervention(s): Assist with latch;Adjust position;Breast compression  Audible Swallowing: A few with stimulation Intervention(s): Hand expression;Skin to skin;Alternate breast massage  Type of Nipple: Everted at rest and after stimulation  Comfort (Breast/Nipple): Soft / non-tender     Hold (Positioning): Full assist, staff holds infant at breast Intervention(s): Support Pillows;Position options  LATCH Score: 6  Lactation Tools Discussed/Used Tools: Nipple Shields Nipple shield size: 20   Consult Status Consult Status: Follow-up Date: 07/25/15 Follow-up type: In-patient    Kristin Love 07/24/2015, 9:36  PM

## 2015-07-24 NOTE — Interval H&P Note (Signed)
History and Physical Interval Note:  07/24/2015 9:00 AM  Kristin Love  has presented today for surgery, with the diagnosis of PREVIOUS CESAREAN SECTION  The various methods of treatment have been discussed with the patient and family. After consideration of risks, benefits and other options for treatment, the patient has consented to  Procedure(s): CESAREAN SECTION (N/A) as a surgical intervention .  The patient's history has been reviewed, patient examined, no change in status, stable for surgery.  I have reviewed the patient's chart and labs.  Questions were answered to the patient's satisfaction.     [place]

## 2015-07-24 NOTE — Lactation Note (Signed)
This note was copied from a baby's chart. Lactation Consultation Note  Patient Name: Kristin Love [id number] Date: 07/24/2015 Reason for consult: Initial assessment Baby at 7 hr of life. Mom and baby are sleepy. Mom stated she would like help latching baby at next feeding. Discussed baby behavior, feeding frequency, baby belly size, voids, wt loss, breast changes, and nipple care. Not sure how much information mom retained, may need more ed. She bf her older child 1 month "beacuse I was young and didn't know any better". Given lactation handouts. Aware of OP services and support group. Needs manual expression teaching. She will call at next feeding for help.     Maternal Data Has patient been taught Hand Expression?: No Does the patient have breastfeeding experience prior to this delivery?: Yes  Feeding Feeding Type: Breast Fed Length of feed: 2 min  LATCH Score/Interventions                      Lactation Tools Discussed/Used WIC Program: Yes   Consult Status Consult Status: Follow-up Date: 07/25/15 Follow-up type: In-patient    Denzil Hughes 07/24/2015, 5:36 PM

## 2015-07-24 NOTE — Anesthesia Procedure Notes (Signed)
Spinal Patient location during procedure: OR Start time: 07/24/2015 9:17 AM End time: 07/24/2015 9:20 AM Staffing Anesthesiologist: Lyn Hollingshead Performed by: anesthesiologist  Preanesthetic Checklist Completed: patient identified, surgical consent, pre-op evaluation, timeout performed, IV checked, risks and benefits discussed and monitors and equipment checked Spinal Block Patient position: sitting Prep: site prepped and draped and DuraPrep Patient monitoring: heart rate, cardiac monitor, continuous pulse ox and blood pressure Approach: midline Location: L3-4 Injection technique: single-shot Needle Needle type: Sprotte  Needle gauge: 24 G Needle length: 9 cm Needle insertion depth: 9 cm Assessment Sensory level: T3

## 2015-07-24 NOTE — Transfer of Care (Signed)
Immediate Anesthesia Transfer of Care Note  Patient: Kristin Love  Procedure(s) Performed: Procedure(s): CESAREAN SECTION (N/A)  Patient Location: PACU  Anesthesia Type:Spinal  Level of Consciousness: awake, alert  and oriented  Airway & Oxygen Therapy: Patient Spontanous Breathing  Post-op Assessment: Report given to RN and Post -op Vital signs reviewed and stable  Post vital signs: Reviewed and stable  Last Vitals:  Filed Vitals:   07/24/15 0753  BP: 120/96  Pulse: 110  Temp: 36.8 C  Resp: 18    Last Pain: There were no vitals filed for this visit.    Patients Stated Pain Goal: 4 ([id number])  Complications: No apparent anesthesia complications

## 2015-07-25 LAB — CBC
HCT: 30.4 % — ABNORMAL LOW (ref 36.0–46.0)
Hemoglobin: 10 g/dL — ABNORMAL LOW (ref 12.0–15.0)
MCH: 27.2 pg (ref 26.0–34.0)
MCHC: 32.9 g/dL (ref 30.0–36.0)
MCV: 82.6 fL (ref 78.0–100.0)
Platelets: 207 10*3/uL (ref 150–400)
RBC: 3.68 MIL/uL — ABNORMAL LOW (ref 3.87–5.11)
RDW: 15.1 % (ref 11.5–15.5)
WBC: 11.6 10*3/uL — ABNORMAL HIGH (ref 4.0–10.5)

## 2015-07-25 NOTE — Lactation Note (Signed)
This note was copied from a baby's chart. Lactation Consultation Note  Follow up visit.  Mom would like to start pumping because baby is having a difficult time latching to left breast.  Mom has areolar edema on left side making breast compression difficult.  Baby nursing well per mom on right breast using nipple shield.  Mom reports milk in shield after feeds.  Symphony pump set up and initiated.  Instructed mom to post pump every 3 hours and give any expressed milk back to baby.  Encouraged to call with concerns/assist prn.  Patient Name: Kristin Love [id number] Date: 07/25/2015     Maternal Data    Feeding Feeding Type: Breast Fed Length of feed: 6 min  LATCH Score/Interventions                      Lactation Tools Discussed/Used Pump Review: Setup, frequency, and cleaning Initiated by:: [place] Date initiated:: 07/25/15   Consult Status Consult Status: Follow-up Date: 07/26/15    Ave Filter 07/25/2015, 11:52 AM

## 2015-07-25 NOTE — Progress Notes (Signed)
POSTPARTUM PROGRESS NOTE  Post Partum Day 1 Subjective:  Kristin Love is a 30 y.o. [id number] [redacted]w[redacted]d s/p rltcs.  No acute events overnight.  Pt denies problems with ambulating, voiding or po intake.  She denies nausea or vomiting.  Pain is well controlled.  She has had flatus. She has not had a bowel movement.  Lochia Small.   Objective: Blood pressure 113/61, pulse 84, temperature 98.4 F (36.9 C), temperature source Oral, resp. rate 18, last menstrual period 10/23/2014, SpO2 96 %, unknown if currently breastfeeding.  Physical Exam:  General: alert, cooperative and no distress Lochia:normal flow Chest: CTAB Heart: RRR no m/r/g Abdomen: +BS, soft, nontender, pico in place and functioning Uterine Fundus: firm,  DVT Evaluation: No calf swelling or tenderness Extremities: trace edema   Recent Labs  07/23/15 1120 07/25/15 0602  HGB 11.7* 10.0*  HCT 35.0* 30.4*    Assessment/Plan:  ASSESSMENT: Kristin Love is a 30 y.o. [id number] [redacted]w[redacted]d s/p rltcs, doing well. h 10.  Plan for discharge tomorrow if all goes well.   LOS: 1 day   Desma Maxim 07/25/2015, 7:14 AM

## 2015-07-25 NOTE — Addendum Note (Signed)
Addendum  created 07/25/15 [id number] by Jonna Munro, CRNA   Modules edited: Clinical Notes   Clinical Notes:  File: [id number]; [place]: [id number]

## 2015-07-25 NOTE — Lactation Note (Signed)
This note was copied from a baby's chart. Lactation Consultation Note  Patient Name: Kristin Love [id number] Date: 07/25/2015 Reason for consult: Initial assessment Baby at 75 hr of life. Mom is able to latch baby to the R breast but not the L breast. The R nipple has a hairline crack around the base of the nipple shaft. Mom has to put a roll under the breast, pull the breast up, hold the NS on, and latch baby. As the baby begins to feed mom relaxes baby and breast. Mom can manually express large drops of colostrum bilaterally but is not having much success with the DEBP. She is thinking about formula feeding until her milk comes in then pumping to feed. She really wants to bf but her size and and the baby's latch are become frustrating to her. Baby will not open her mouth wide and she is having trouble flanging her top lip. Her bottom lip flanges to make the normal "o" shape but her top lip stays flat like a straight line. Reviewed nipple care and gave her comfort gels. She will call as needed.      Maternal Data    Feeding Feeding Type: Breast Fed Length of feed: 30 min  LATCH Score/Interventions Latch: Repeated attempts needed to sustain latch, nipple held in mouth throughout feeding, stimulation needed to elicit sucking reflex. Intervention(s): Adjust position;Assist with latch;Breast massage;Breast compression  Audible Swallowing: Spontaneous and intermittent Intervention(s): Skin to skin;Hand expression;Alternate breast massage  Type of Nipple: Everted at rest and after stimulation  Comfort (Breast/Nipple): Filling, red/small blisters or bruises, mild/mod discomfort  Problem noted: Mild/Moderate discomfort;Cracked, bleeding, blisters, bruises Interventions  (Cracked/bleeding/bruising/blister): Expressed breast milk to nipple Interventions (Mild/moderate discomfort): Comfort gels  Hold (Positioning): Full assist, staff holds infant at breast Intervention(s): Support  Pillows;Position options  LATCH Score: 6  Lactation Tools Discussed/Used Tools: Nipple Shields Nipple shield size: 20   Consult Status Consult Status: Follow-up Date: 07/26/15 Follow-up type: In-patient    Denzil Hughes 07/25/2015, 10:33 PM

## 2015-07-25 NOTE — Anesthesia Postprocedure Evaluation (Signed)
Anesthesia Post Note  Patient: Kristin Love  Procedure(s) Performed: Procedure(s) (LRB): CESAREAN SECTION (N/A)  Patient location during evaluation: Mother Baby Anesthesia Type: Spinal Level of consciousness: awake and alert and oriented Pain management: satisfactory to patient Vital Signs Assessment: post-procedure vital signs reviewed and stable Respiratory status: spontaneous breathing and nonlabored ventilation Cardiovascular status: stable Postop Assessment: no headache, no backache, patient able to bend at knees, no signs of nausea or vomiting and adequate PO intake Anesthetic complications: no     Last Vitals:  Filed Vitals:   07/24/15 2250 07/25/15 0345  BP: 110/66 113/61  Pulse: 86 84  Temp: 36.6 C 36.9 C  Resp: 18 18    Last Pain:  Filed Vitals:   07/25/15 0656  PainSc: 2    Pain Goal: Patients Stated Pain Goal: 4 (07/24/15 0753)               Willa Rough

## 2015-07-26 MED ORDER — OXYCODONE-ACETAMINOPHEN 5-325 MG PO TABS: 1.0000 | ORAL_TABLET | Freq: Four times a day (QID) | ORAL | Status: DC | PRN

## 2015-07-26 NOTE — Discharge Summary (Signed)
OB Discharge Summary     Patient Name: Kristin Love DOB: October 14, 1985 MRN: [id number]  Date of admission: 07/24/2015 Delivering MD: Donnamae Jude   Date of discharge: 07/26/2015  Admitting diagnosis: PREVIOUS CESAREAN SECTION Intrauterine pregnancy: [redacted]w[redacted]d     Secondary diagnosis:  Principal Problem:   Status post repeat low transverse cesarean section Active Problems:   OBESITY, NOS   Supervision of low-risk pregnancy   Previous cesarean delivery affecting pregnancy, antepartum   Genital herpes affecting pregnancy, antepartum   Group B Streptococcus carrier, +RV culture, currently pregnant  Additional problems: none     Discharge diagnosis: Term Pregnancy Delivered                                                                                                Post partum procedures:None  Augmentation: none  Complications: None  Hospital course:  Sceduled C/S   30 y.o. yo [id number] at [redacted]w[redacted]d was admitted to the hospital 07/24/2015 for scheduled cesarean section with the following indication:Elective Repeat.  Membrane Rupture Time/Date: 9:51 AM ,07/24/2015   Patient delivered a Viable infant.07/24/2015  Details of operation can be found in separate operative note.  Pateint had an uncomplicated postpartum course.  She is ambulating, tolerating a regular diet, passing flatus, and urinating well. Patient is discharged home in stable condition on  07/26/2015          Physical exam  Filed Vitals:   07/25/15 0345 07/25/15 1000 07/25/15 1801 07/26/15 0615  BP: 113/61 121/61 131/76 105/66  Pulse: 84 81 110 75  Temp: 98.4 F (36.9 C) 98 F (36.7 C) 98.5 F (36.9 C)   TempSrc: Oral Oral Oral   Resp: 18 20 18 18   SpO2: 96%  99%    General: alert, cooperative and no distress Lochia: appropriate Uterine Fundus: firm Incision: Healing well with no significant drainage, PICO in place DVT Evaluation: No evidence of DVT seen on physical exam. Negative Homan's sign. Labs: Lab  Results  Component Value Date   WBC 11.6* 07/25/2015   HGB 10.0* 07/25/2015   HCT 30.4* 07/25/2015   MCV 82.6 07/25/2015   PLT 207 07/25/2015   CMP Latest Ref Rng 05/16/2015  Glucose 65 - 99 mg/dL 105(H)  BUN 6 - 20 mg/dL 5(L)  Creatinine 0.44 - 1.00 mg/dL 0.51  Sodium 135 - 145 mmol/L 136  Potassium 3.5 - 5.1 mmol/L 3.9  Chloride 101 - 111 mmol/L 104  CO2 22 - 32 mmol/L 23  Calcium 8.9 - 10.3 mg/dL 8.3(L)  Total Protein 6.5 - 8.1 g/dL 7.4  Total Bilirubin 0.3 - 1.2 mg/dL 0.7  Alkaline Phos 38 - 126 U/L 72  AST 15 - 41 U/L 20  ALT 14 - 54 U/L 22    Discharge instruction: per After Visit Summary and "Baby and Me Booklet".  After visit meds:    Medication List    TAKE these medications        prenatal multivitamin Tabs tablet  Take 1 tablet by mouth daily at 12 noon.     terconazole 0.8 % vaginal cream  Commonly known  as:  TERAZOL 3  Place 1 applicator vaginally at bedtime.     valACYclovir 500 MG tablet  Commonly known as:  VALTREX  Take 500 mg by mouth daily.        Diet: routine diet  Activity: Advance as tolerated. Pelvic rest for 6 weeks.   Outpatient follow up:6 weeks Follow up Appt:Future Appointments Date Time Provider [place]  08/22/2015 2:40 PM Myra Marijo Sanes, MD [facility]   Follow up Visit:No Follow-up on file.  Postpartum contraception: IUD Mirena  Newborn Data: Live born female  Birth Weight: 8 lb 5.5 oz (3785 g) APGAR: 8, 9  Baby Feeding: Breast Disposition:home with mother   07/26/2015 Caren Macadam, MD

## 2015-07-27 LAB — TYPE AND SCREEN
ABO/RH(D): O POS
Antibody Screen: NEGATIVE
Unit division: 0
Unit division: 0

## 2015-07-30 ENCOUNTER — Telehealth: Payer: Self-pay | Admitting: *Deleted

## 2015-07-30 NOTE — Telephone Encounter (Signed)
Patient called, stating had c/s on 6/27, home with a dressing, dressing was coming up, home nurse was there and said there was a lot of drainage. Should she come to clinic or to [facility] to have the wound checked? Please return call.

## 2015-07-30 NOTE — Telephone Encounter (Signed)
Attempted to call patient, call went to voice mail. Message left stating that the clinic is closed until 7/5. If she feels like the wound may be infected, the best thing to do at this time is go to [facility]U and have it checked there.

## 2015-08-07 ENCOUNTER — Ambulatory Visit (INDEPENDENT_AMBULATORY_CARE_PROVIDER_SITE_OTHER): Payer: Medicaid Other | Admitting: Advanced Practice Midwife

## 2015-08-07 ENCOUNTER — Telehealth: Payer: Self-pay | Admitting: *Deleted

## 2015-08-07 ENCOUNTER — Encounter: Payer: Self-pay | Admitting: Advanced Practice Midwife

## 2015-08-07 VITALS — BP 95/51 | HR 93 | Wt 335.6 lb

## 2015-08-07 DIAGNOSIS — O1205 Gestational edema, complicating the puerperium: Secondary | ICD-10-CM

## 2015-08-07 DIAGNOSIS — Z98891 History of uterine scar from previous surgery: Secondary | ICD-10-CM

## 2015-08-07 DIAGNOSIS — O34219 Maternal care for unspecified type scar from previous cesarean delivery: Secondary | ICD-10-CM

## 2015-08-07 NOTE — Progress Notes (Signed)
Patient ID: Kristin Love, female   DOB: [birth year], 30 y.o.   MRN: [id number]  S: 2 weeks PP C/S Incision check. C/O fluid build-up in abd. Had concerns about incision drainage last week, but none since then.   O: BP 95/51 mmHg  Pulse 93  Wt 335 lb 9.6 oz (152.227 kg)  LMP 10/23/2014  General: NAD Heart: Reg rate Lungs Reg rate an effort Abd: Incision C/D/I, healing well. NT. Lowest portion of panus has pitting edema, but no tenderness, erythema, warmth.   A: Postpartum edema. Incision healing well w/out evidence of infection.   P: F/U  For PP visit AS in 3 weeks.

## 2015-08-07 NOTE — Patient Instructions (Signed)
Incision Care °An incision is when a surgeon cuts into your body. After surgery, the incision needs to be cared for properly to prevent infection.  °HOW TO CARE FOR YOUR INCISION °· Take medicines only as directed by your health care provider. °· There are many different ways to close and cover an incision, including stitches, skin glue, and adhesive strips. Follow your health care provider's instructions on: °¨ Incision care. °¨ Bandage (dressing) changes and removal. °¨ Incision closure removal. °· Do not take baths, swim, or use a hot tub until your health care provider approves. You may shower as directed by your health care provider. °· Resume your normal diet and activities as directed. °· Use anti-itch medicine (such as an antihistamine) as directed by your health care provider. The incision may itch while it is healing. Do not pick or scratch at the incision. °· Drink enough fluid to keep your urine clear or pale yellow. °SEEK MEDICAL CARE IF:  °· You have drainage, redness, swelling, or pain at your incision site. °· You have muscle aches, chills, or a general ill feeling. °· You notice a bad smell coming from the incision or dressing. °· Your incision edges separate after the sutures, staples, or skin adhesive strips have been removed. °· You have persistent nausea or vomiting. °· You have a fever. °· You are dizzy. °SEEK IMMEDIATE MEDICAL CARE IF:  °· You have a rash. °· You faint. °· You have difficulty breathing. °MAKE SURE YOU:  °· Understand these instructions. °· Will watch your condition. °· Will get help right away if you are not doing well or get worse. °  °This information is not intended to replace advice given to you by your health care provider. Make sure you discuss any questions you have with your health care provider. °  °Document Released: 08/02/2004 Document Revised: 02/03/2014 Document Reviewed: 03/09/2013 °Elsevier Interactive Patient Education ©2016 [employer] ° °

## 2015-08-07 NOTE — Telephone Encounter (Signed)
Pt left message yesterday stating that she has fluid above her navel. The area is not red or hot but she does have some pain there. She wants to know if she needs to be seen. Pt's call was returned and I asked if she can come in today for evaluation of her concern.  Pt agreed to appt today @ 1340.

## 2015-08-22 ENCOUNTER — Encounter: Payer: Self-pay | Admitting: Obstetrics & Gynecology

## 2015-08-22 ENCOUNTER — Ambulatory Visit (INDEPENDENT_AMBULATORY_CARE_PROVIDER_SITE_OTHER): Payer: Medicaid Other | Admitting: Obstetrics & Gynecology

## 2015-08-22 NOTE — Progress Notes (Signed)
Subjective:     Kristin Love is a 30 y.o.S AA P2  female who presents for a postpartum visit. She is 4 weeks postpartum following a low cervical transverse Cesarean section. I have fully reviewed the prenatal and intrapartum course. The delivery was at 46 gestational weeks. Outcome: repeat cesarean section, low transverse incision. Anesthesia: spinal. Postpartum course has been unremarkable. Baby's course has been complicated by gerd. Baby is feeding by breast. Bleeding no bleeding. Bowel function is normal. Bladder function is normal. Patient is sexually active. Contraception method is none. Postpartum depression screening: negative.  The following portions of the patient's history were reviewed and updated as appropriate: allergies, current medications, past family history, past medical history, past social history, past surgical history and problem list.  Review of Systems Pertinent items are noted in HPI.   Objective:    BP (!) 113/50 (BP Location: Left Arm, Patient Position: Sitting, Cuff Size: Large)   Pulse 91   Wt (!) 337 lb 9.6 oz (153.1 kg)   Breastfeeding? Yes   BMI 51.33 kg/m   General:  morbidly obese   Breasts:  inspection negative, no nipple discharge or bleeding, no masses or nodularity palpable  Lungs: clear to auscultation bilaterally  Heart:  regular rate and rhythm, S1, S2 normal, no murmur, click, rub or gallop  Abdomen: soft, non-tender; bowel sounds normal; no masses,  no organomegaly Incision: healed well   Vulva:  not evaluated  Vagina: not evaluated  Cervix:  not evaluated  Corpus: not examined  Adnexa:  not evaluated  Rectal Exam: Not performed.        Assessment:     Normal postpartum exam. Pap smear not done at today's visit.   Plan:    1. Contraception: abstinence for 2 weeks then RTC for UPT and Liletta if it is negative

## 2015-09-04 ENCOUNTER — Other Ambulatory Visit: Payer: Self-pay | Admitting: Obstetrics and Gynecology

## 2015-09-04 ENCOUNTER — Telehealth: Payer: Self-pay | Admitting: *Deleted

## 2015-09-04 MED ORDER — SERTRALINE HCL 100 MG PO TABS: 100.0000 mg | ORAL_TABLET | Freq: Every day | ORAL | 3 refills | Status: DC

## 2015-09-04 NOTE — Telephone Encounter (Signed)
Pt requested a call back about some things she is experiencing.

## 2015-09-04 NOTE — Telephone Encounter (Signed)
Spoke with patient who stated she has been crying a lot and feeling overwhelmed and thoughts of hurting herself and baby have cross her mind. I ask the patient if she had any assistance with for her and baby and she stated my mother is here and helping. Patient stated she lied on her PHQ-9 screening  because she did not want [place] to take her baby. I advised patient we would not try and take her baby but we would like to offer her some assistance with dealing with her feelings. I spoke with my office manager who ask me to call [employer] coverage to see if they could place patient on something to help with feelings until patient appointment on Monday 09/10/2015 at 8:45. Patient was agreeable to appointment and I will call her back once I have answer from [facility] provider. Patient was very thankful of my time and hung up the phone.

## 2015-09-04 NOTE — Telephone Encounter (Signed)
I have spoken with Dr.Constant who agreeable with calling patient in some Zoloft 100 mg to help with some of her symptoms she is experiencing at this time. Per Dr.Costant patient NEEDS TO GO TO [facility] to be check out. Patient stated she will see what she can due because she has to work. Patient verbalizes understanding and I will follow up tomorrow with patient.

## 2015-09-10 ENCOUNTER — Ambulatory Visit (INDEPENDENT_AMBULATORY_CARE_PROVIDER_SITE_OTHER): Payer: Self-pay | Admitting: Clinical

## 2015-09-10 DIAGNOSIS — O99345 Other mental disorders complicating the puerperium: Secondary | ICD-10-CM

## 2015-09-10 DIAGNOSIS — F329 Major depressive disorder, single episode, unspecified: Secondary | ICD-10-CM

## 2015-09-10 NOTE — Progress Notes (Signed)
  ASSESSMENT: Pt currently experiencing Major depressive disorder, single episode with postpartum onset.  Pt needs to f/u with [facility].  Pt would benefit from psychoeducation and brief therapeutic interventions regarding coping with symptoms of depression and anxiety.  Pt may benefit from establishing care with psychiatry.  Stage of Change: contemplative  PLAN: 1. F/U with behavioral health clinician in two weeks, or earlier, as needed. 2. Psychiatric Medications: Zoloft began one week prior. 3. Behavioral recommendations:   -Consider daily relaxation breathing exercise, when baby sleeps, followed by nap, as often as needed to feel rested. Sleep is priority.  -Continue playing thunder sound app for baby at sleep time -Consider placing water and snacks for easy access throughout house -Consider contacting community resources for utility bill assistance -Consider postpartum support groups in either [place] or [employer] -Consider "worry hour" technique to prioritize life stressors -Call 911 or 24-hour crisis numbers given, if symptoms increase  4. Referral: Pt says will consider psychiatry in future, if symptoms do not go away after two weeks on Zoloft(will f/u with [facility] in 2 weeks)  SUBJECTIVE: Pt. referred by Clovia Cuff, MD for symptoms of depression and anxiety Pt. reports the following symptoms/concerns: Pt states that she has felt an increase in stress, depression, anxiety and concerning thoughts postpartum. Pt primary concern is lack of sleep, and having difficulty going to sleep when baby sleeps; also realizes she is not eating much or drinking enough water. Pt experiencing financial stress over bills and possibly going back to work too soon over finances; her mother and grandfather are giving some practical and financial help. No increase in symptoms in past week on Zoloft.    Duration of problem: Less than 7 weeks Severity: severe   OBJECTIVE: Orientation & Cognition: Oriented x3.  Thought processes normal and appropriate to situation. Mood: appropriate Affect: appropriate Appearance: appropriate Risk of harm to self or others: low risk of harm to self or others today, no plan of harm to self or others Substance use: none Assessments administered: PHQ9: 21/ GAD7: 20  Diagnosis: Major depressive disorder with postpartum onset CPT Code: O99.345  -------------------------------------------- Other(s) present in the room: none  Time spent with patient in exam room: 40 minutes 9:00-9:40am  Depression screen [facility] 2/9 09/10/2015 08/22/2015  Decreased Interest 3 0  Down, Depressed, Hopeless 3 0  PHQ - 2 Score 6 0  Altered sleeping 3 0  Tired, decreased energy 3 1  Change in appetite 3 0  Feeling bad or failure about yourself  3 0  Trouble concentrating 1 0  Moving slowly or fidgety/restless 1 0  Suicidal thoughts 1 0  PHQ-9 Score 21 1   GAD 7 : Generalized Anxiety Score 09/10/2015 08/22/2015  Nervous, Anxious, on Edge 3 1  Control/stop worrying 3 0  Worry too much - different things 3 0  Trouble relaxing 3 0  Restless 3 0  Easily annoyed or irritable 3 1  Afraid - awful might happen 2 0  Total GAD 7 Score 20 2

## 2016-06-18 ENCOUNTER — Other Ambulatory Visit (HOSPITAL_COMMUNITY)
Admission: RE | Admit: 2016-06-18 | Discharge: 2016-06-18 | Disposition: A | Discharge status: Home / Self Care | Payer: PRIVATE HEALTH INSURANCE | Source: Ambulatory Visit | Attending: Obstetrics and Gynecology | Admitting: Obstetrics and Gynecology

## 2016-06-18 ENCOUNTER — Ambulatory Visit (INDEPENDENT_AMBULATORY_CARE_PROVIDER_SITE_OTHER): Payer: PRIVATE HEALTH INSURANCE | Admitting: Obstetrics and Gynecology

## 2016-06-18 ENCOUNTER — Encounter: Payer: Self-pay | Admitting: Obstetrics and Gynecology

## 2016-06-18 ENCOUNTER — Encounter: Payer: Self-pay | Admitting: *Deleted

## 2016-06-18 VITALS — BP 122/75 | HR 102 | Ht 68.0 in | Wt 362.0 lb

## 2016-06-18 DIAGNOSIS — N898 Other specified noninflammatory disorders of vagina: Secondary | ICD-10-CM

## 2016-06-18 DIAGNOSIS — Z113 Encounter for screening for infections with a predominantly sexual mode of transmission: Secondary | ICD-10-CM | POA: Diagnosis not present

## 2016-06-18 DIAGNOSIS — Z30432 Encounter for removal of intrauterine contraceptive device: Secondary | ICD-10-CM

## 2016-06-18 DIAGNOSIS — Z01419 Encounter for gynecological examination (general) (routine) without abnormal findings: Secondary | ICD-10-CM | POA: Insufficient documentation

## 2016-06-18 DIAGNOSIS — Z124 Encounter for screening for malignant neoplasm of cervix: Secondary | ICD-10-CM

## 2016-06-18 DIAGNOSIS — N9089 Other specified noninflammatory disorders of vulva and perineum: Secondary | ICD-10-CM

## 2016-06-18 DIAGNOSIS — Z975 Presence of (intrauterine) contraceptive device: Secondary | ICD-10-CM

## 2016-06-18 NOTE — Progress Notes (Signed)
Pt presents for annual c/o recurrent BV and requests all STD testing today. Wants Liletta removed d/t excessive wt gain and irreg cyles.

## 2016-06-18 NOTE — Patient Instructions (Signed)
Health Maintenance, Female Adopting a healthy lifestyle and getting preventive care can go a long way to promote health and wellness. Talk with your health care provider about what schedule of regular examinations is right for you. This is a good chance for you to check in with your provider about disease prevention and staying healthy. In between checkups, there are plenty of things you can do on your own. Experts have done a lot of research about which lifestyle changes and preventive measures are most likely to keep you healthy. Ask your health care provider for more information. Weight and diet Eat a healthy diet  Be sure to include plenty of vegetables, fruits, low-fat dairy products, and lean protein.  Do not eat a lot of foods high in solid fats, added sugars, or salt.  Get regular exercise. This is one of the most important things you can do for your health.  Most adults should exercise for at least 150 minutes each week. The exercise should increase your heart rate and make you sweat (moderate-intensity exercise).  Most adults should also do strengthening exercises at least twice a week. This is in addition to the moderate-intensity exercise. Maintain a healthy weight  Body mass index (BMI) is a measurement that can be used to identify possible weight problems. It estimates body fat based on height and weight. Your health care provider can help determine your BMI and help you achieve or maintain a healthy weight.  For females 30 years of age and older:  A BMI below 18.5 is considered underweight.  A BMI of 18.5 to 24.9 is normal.  A BMI of 25 to 29.9 is considered overweight.  A BMI of 30 and above is considered obese. Watch levels of cholesterol and blood lipids  You should start having your blood tested for lipids and cholesterol at 30 years of age, then have this test every 5 years.  You may need to have your cholesterol levels checked more often if:  Your lipid or  cholesterol levels are high.  You are older than 30 years of age.  You are at high risk for heart disease. Cancer screening Lung Cancer  Lung cancer screening is recommended for adults 30-30 years old who are at high risk for lung cancer because of a history of smoking.  A yearly low-dose CT scan of the lungs is recommended for people who:  Currently smoke.  Have quit within the past 15 years.  Have at least a 30-pack-year history of smoking. A pack year is smoking an average of one pack of cigarettes a day for 1 year.  Yearly screening should continue until it has been 15 years since you quit.  Yearly screening should stop if you develop a health problem that would prevent you from having lung cancer treatment. Breast Cancer  Practice breast self-awareness. This means understanding how your breasts normally appear and feel.  It also means doing regular breast self-exams. Let your health care provider know about any changes, no matter how small.  If you are in your 20s or 30s, you should have a clinical breast exam (CBE) by a health care provider every 1-3 years as part of a regular health exam.  If you are 34 or older, have a CBE every year. Also consider having a breast X-ray (mammogram) every year.  If you have a family history of breast cancer, talk to your health care provider about genetic screening.  If you are at high risk for breast cancer, talk  to your health care provider about having an MRI and a mammogram every year.  Breast cancer gene (BRCA) assessment is recommended for women who have family members with BRCA-related cancers. BRCA-related cancers include:  Breast.  Ovarian.  Tubal.  Peritoneal cancers.  Results of the assessment will determine the need for genetic counseling and BRCA1 and BRCA2 testing. Cervical Cancer  Your health care provider may recommend that you be screened regularly for cancer of the pelvic organs (ovaries, uterus, and vagina).  This screening involves a pelvic examination, including checking for microscopic changes to the surface of your cervix (Pap test). You may be encouraged to have this screening done every 3 years, beginning at age 24.  For women ages 66-65, health care providers may recommend pelvic exams and Pap testing every 3 years, or they may recommend the Pap and pelvic exam, combined with testing for human papilloma virus (HPV), every 5 years. Some types of HPV increase your risk of cervical cancer. Testing for HPV may also be done on women of any age with unclear Pap test results.  Other health care providers may not recommend any screening for nonpregnant women who are considered low risk for pelvic cancer and who do not have symptoms. Ask your health care provider if a screening pelvic exam is right for you.  If you have had past treatment for cervical cancer or a condition that could lead to cancer, you need Pap tests and screening for cancer for at least 20 years after your treatment. If Pap tests have been discontinued, your risk factors (such as having a new sexual partner) need to be reassessed to determine if screening should resume. Some women have medical problems that increase the chance of getting cervical cancer. In these cases, your health care provider may recommend more frequent screening and Pap tests. Colorectal Cancer  This type of cancer can be detected and often prevented.  Routine colorectal cancer screening usually begins at 30 years of age and continues through 30 years of age.  Your health care provider may recommend screening at an earlier age if you have risk factors for colon cancer.  Your health care provider may also recommend using home test kits to check for hidden blood in the stool.  A small camera at the end of a tube can be used to examine your colon directly (sigmoidoscopy or colonoscopy). This is done to check for the earliest forms of colorectal cancer.  Routine  screening usually begins at age 41.  Direct examination of the colon should be repeated every 5-10 years through 30 years of age. However, you may need to be screened more often if early forms of precancerous polyps or small growths are found. Skin Cancer  Check your skin from head to toe regularly.  Tell your health care provider about any new moles or changes in moles, especially if there is a change in a mole's shape or color.  Also tell your health care provider if you have a mole that is larger than the size of a pencil eraser.  Always use sunscreen. Apply sunscreen liberally and repeatedly throughout the day.  Protect yourself by wearing long sleeves, pants, a wide-brimmed hat, and sunglasses whenever you are outside. Heart disease, diabetes, and high blood pressure  High blood pressure causes heart disease and increases the risk of stroke. High blood pressure is more likely to develop in:  People who have blood pressure in the high end of the normal range (130-139/85-89 mm Hg).  People who are overweight or obese.  People who are African American.  If you are 59-24 years of age, have your blood pressure checked every 3-5 years. If you are 34 years of age or older, have your blood pressure checked every year. You should have your blood pressure measured twice-once when you are at a hospital or clinic, and once when you are not at a hospital or clinic. Record the average of the two measurements. To check your blood pressure when you are not at a hospital or clinic, you can use:  An automated blood pressure machine at a pharmacy.  A home blood pressure monitor.  If you are between 29 years and 60 years old, ask your health care provider if you should take aspirin to prevent strokes.  Have regular diabetes screenings. This involves taking a blood sample to check your fasting blood sugar level.  If you are at a normal weight and have a low risk for diabetes, have this test once  every three years after 30 years of age.  If you are overweight and have a high risk for diabetes, consider being tested at a younger age or more often. Preventing infection Hepatitis B  If you have a higher risk for hepatitis B, you should be screened for this virus. You are considered at high risk for hepatitis B if:  You were born in a country where hepatitis B is common. Ask your health care provider which countries are considered high risk.  Your parents were born in a high-risk country, and you have not been immunized against hepatitis B (hepatitis B vaccine).  You have HIV or AIDS.  You use needles to inject street drugs.  You live with someone who has hepatitis B.  You have had sex with someone who has hepatitis B.  You get hemodialysis treatment.  You take certain medicines for conditions, including cancer, organ transplantation, and autoimmune conditions. Hepatitis C  Blood testing is recommended for:  Everyone born from [birth year] through 1964.  Anyone with known risk factors for hepatitis C. Sexually transmitted infections (STIs)  You should be screened for sexually transmitted infections (STIs) including gonorrhea and chlamydia if:  You are sexually active and are younger than 30 years of age.  You are older than 30 years of age and your health care provider tells you that you are at risk for this type of infection.  Your sexual activity has changed since you were last screened and you are at an increased risk for chlamydia or gonorrhea. Ask your health care provider if you are at risk.  If you do not have HIV, but are at risk, it may be recommended that you take a prescription medicine daily to prevent HIV infection. This is called pre-exposure prophylaxis (PrEP). You are considered at risk if:  You are sexually active and do not regularly use condoms or know the HIV status of your partner(s).  You take drugs by injection.  You are sexually active with a partner  who has HIV. Talk with your health care provider about whether you are at high risk of being infected with HIV. If you choose to begin PrEP, you should first be tested for HIV. You should then be tested every 3 months for as long as you are taking PrEP. Pregnancy  If you are premenopausal and you may become pregnant, ask your health care provider about preconception counseling.  If you may become pregnant, take 400 to 800 micrograms (mcg) of folic acid  every day.  If you want to prevent pregnancy, talk to your health care provider about birth control (contraception). Osteoporosis and menopause  Osteoporosis is a disease in which the bones lose minerals and strength with aging. This can result in serious bone fractures. Your risk for osteoporosis can be identified using a bone density scan.  If you are 4 years of age or older, or if you are at risk for osteoporosis and fractures, ask your health care provider if you should be screened.  Ask your health care provider whether you should take a calcium or vitamin D supplement to lower your risk for osteoporosis.  Menopause may have certain physical symptoms and risks.  Hormone replacement therapy may reduce some of these symptoms and risks. Talk to your health care provider about whether hormone replacement therapy is right for you. Follow these instructions at home:  Schedule regular health, dental, and eye exams.  Stay current with your immunizations.  Do not use any tobacco products including cigarettes, chewing tobacco, or electronic cigarettes.  If you are pregnant, do not drink alcohol.  If you are breastfeeding, limit how much and how often you drink alcohol.  Limit alcohol intake to no more than 1 drink per day for nonpregnant women. One drink equals 12 ounces of beer, 5 ounces of wine, or 1 ounces of hard liquor.  Do not use street drugs.  Do not share needles.  Ask your health care provider for help if you need support  or information about quitting drugs.  Tell your health care provider if you often feel depressed.  Tell your health care provider if you have ever been abused or do not feel safe at home. This information is not intended to replace advice given to you by your health care provider. Make sure you discuss any questions you have with your health care provider. Document Released: 07/29/2010 Document Revised: 06/21/2015 Document Reviewed: 10/17/2014 Elsevier Interactive Patient Education  2017 [employer].

## 2016-06-18 NOTE — Progress Notes (Signed)
Patient ID: AMBRY DIX, female   DOB: 11-14-1985, 31 y.o.   MRN: [id number]  Kristin Love is a 31 y.o. [id number] female here for a routine annual gynecologic exam. She is having irregular bleeding with the IUD. Some wt gain as well. Considering removal. She also has a skin tag that she would like to have removed.     Gynecologic History No LMP recorded. Contraception: IUD Last Pap: 2016. Results were: normal Last mammogram: NA. Results were: NA  Obstetric History OB History  Gravida Para Term Preterm AB Living  4 2 2  0 2 2  SAB TAB Ectopic Multiple Live Births  0 2 0 0 2    # Outcome Date GA Lbr Len/2nd Weight Sex Delivery Anes PTL Lv  4 Term 07/24/15 [redacted]w[redacted]d  8 lb 5.5 oz (3.785 kg) F CS-LTranv Spinal  LIV     Birth Comments: congenital umbilical hernia  3 TAB 2229          2 Term 03/01/08 [redacted]w[redacted]d  9 lb 7 oz (4.281 kg) M CS-LTranv  N LIV     Birth Comments: c/s - thinks for long labor  1 TAB 2007              Past Medical History:  Diagnosis Date  . Herpes   . Vaginal Pap smear, abnormal    had colposcopy repeat pap negative    Past Surgical History:  Procedure Laterality Date  . CESAREAN SECTION    . CESAREAN SECTION N/A 07/24/2015   Procedure: CESAREAN SECTION;  Surgeon: Donnamae Jude, MD;  Location: [facility];  Service: Obstetrics;  Laterality: N/A;    No current outpatient prescriptions on file prior to visit.   No current facility-administered medications on file prior to visit.     Allergies  Allergen Reactions  . Shellfish Allergy Anaphylaxis  . Fish Allergy Swelling    Raw shrimp    Social History   Social History  . Marital status: Single    Spouse name: N/A  . Number of children: N/A  . Years of education: N/A   Occupational History  . Not on file.   Social History Main Topics  . Smoking status: Never Smoker  . Smokeless tobacco: Never Used  . Alcohol use No  . Drug use: No  . Sexual activity: Yes    Birth control/ protection:  IUD   Other Topics Concern  . Not on file   Social History Narrative  . No narrative on file    Family History  Problem Relation Age of Onset  . Hypertension Mother   . Hypertension Father   . Diabetes Father     The following portions of the patient's history were reviewed and updated as appropriate: allergies, current medications, past family history, past medical history, past social history, past surgical history and problem list.  Review of Systems Pertinent items noted in HPI and remainder of comprehensive ROS otherwise negative.   Objective:  BP 122/75   Pulse (!) 102   Ht 5\' 8"  (1.727 m)   Wt (!) 362 lb (164.2 kg)   BMI 55.04 kg/m  CONSTITUTIONAL: Well-developed, well-nourished female in no acute distress.  HENT:  Normocephalic, atraumatic, External right and left ear normal. Oropharynx is clear and moist EYES: Conjunctivae and EOM are normal. Pupils are equal, round, and reactive to light. No scleral icterus.  NECK: Normal range of motion, supple, no masses.  Normal thyroid.  SKIN: Skin is warm and dry.  No rash noted. Not diaphoretic. No erythema. No pallor. Daisy: Alert and oriented to person, place, and time. Normal reflexes, muscle tone coordination. No cranial nerve deficit noted. PSYCHIATRIC: Normal mood and affect. Normal behavior. Normal judgment and thought content. CARDIOVASCULAR: Normal heart rate noted, regular rhythm RESPIRATORY: Clear to auscultation bilaterally. Effort and breath sounds normal, no problems with respiration noted. BREASTS: Symmetric in size. No masses, skin changes, nipple drainage, or lymphadenopathy. ABDOMEN: Soft, normal bowel sounds, no distention noted.  No tenderness, rebound or guarding.  PELVIC: Normal appearing external genitalia; normal appearing vaginal mucosa and cervix.  No abnormal discharge noted.  Pap smear obtained.  Normal uterine size, no other palpable masses, no uterine or adnexal tenderness. Small skin tag noted  on left side of MONS MUSCULOSKELETAL: Normal range of motion. No tenderness.  No cyanosis, clubbing, or edema.  2+ distal pulses.   Procedure Note Skin tag removal  After informed consent was obtained. Skin tag area was cleaned with alcohol. Anesthized with 2 cc Lidocaine, plain. Skin tag was grasped and excised at base. Sent to pathology. Hemostasis obtained with Silver Nitrate. Pt tolerated procedure well. Wound care reviewed.   Assessment:  Annual gynecologic examination with pap smear IUD contraception Skin tag removal STD exposure Plan:  Will follow up results of pap smear and manage accordingly. Discussed contraception management options, following discussion pt desired to continue with IUD Routine preventative health maintenance measures emphasized. Please refer to After Visit Summary for other counseling recommendations.    Chancy Milroy, MD, [place] [facility] for [facility], [place]

## 2016-06-19 LAB — CERVICOVAGINAL ANCILLARY ONLY
Bacterial vaginitis: POSITIVE — AB
Candida vaginitis: NEGATIVE
Chlamydia: NEGATIVE
Neisseria Gonorrhea: NEGATIVE
Trichomonas: NEGATIVE

## 2016-06-19 LAB — HEPATITIS C ANTIBODY: Hep C Virus Ab: <0.1 s/co ratio (ref 0.0–0.9)

## 2016-06-19 LAB — RPR: RPR Ser Ql: NONREACTIVE

## 2016-06-19 LAB — HIV ANTIBODY (ROUTINE TESTING W REFLEX): HIV Screen 4th Generation wRfx: NONREACTIVE

## 2016-06-19 LAB — HEPATITIS B SURFACE ANTIGEN: Hepatitis B Surface Ag: NEGATIVE

## 2016-06-20 ENCOUNTER — Other Ambulatory Visit: Payer: Self-pay | Admitting: *Deleted

## 2016-06-20 DIAGNOSIS — B9689 Other specified bacterial agents as the cause of diseases classified elsewhere: Secondary | ICD-10-CM

## 2016-06-20 DIAGNOSIS — N760 Acute vaginitis: Principal | ICD-10-CM

## 2016-06-20 LAB — CYTOLOGY - PAP: Diagnosis: NEGATIVE

## 2016-06-20 MED ORDER — METRONIDAZOLE 500 MG PO TABS: 500.0000 mg | ORAL_TABLET | Freq: Two times a day (BID) | ORAL | 0 refills | Status: DC

## 2016-06-20 NOTE — Progress Notes (Signed)
See lab result

## 2016-06-25 ENCOUNTER — Ambulatory Visit (INDEPENDENT_AMBULATORY_CARE_PROVIDER_SITE_OTHER): Payer: PRIVATE HEALTH INSURANCE | Admitting: Physician Assistant

## 2016-06-25 VITALS — BP 121/79 | HR 104 | Ht 67.0 in | Wt 365.0 lb

## 2016-06-25 DIAGNOSIS — Z5989 Other problems related to housing and economic circumstances: Secondary | ICD-10-CM | POA: Insufficient documentation

## 2016-06-25 DIAGNOSIS — R5383 Other fatigue: Secondary | ICD-10-CM

## 2016-06-25 DIAGNOSIS — Z598 Other problems related to housing and economic circumstances: Secondary | ICD-10-CM | POA: Diagnosis not present

## 2016-06-25 DIAGNOSIS — Z7689 Persons encountering health services in other specified circumstances: Secondary | ICD-10-CM

## 2016-06-25 DIAGNOSIS — F332 Major depressive disorder, recurrent severe without psychotic features: Secondary | ICD-10-CM | POA: Diagnosis not present

## 2016-06-25 DIAGNOSIS — Z59819 Housing instability, housed unspecified: Secondary | ICD-10-CM

## 2016-06-25 DIAGNOSIS — B009 Herpesviral infection, unspecified: Secondary | ICD-10-CM | POA: Insufficient documentation

## 2016-06-25 MED ORDER — ESCITALOPRAM OXALATE 20 MG PO TABS: ORAL_TABLET | ORAL | 3 refills | Status: DC

## 2016-06-25 MED ORDER — TRAZODONE HCL 50 MG PO TABS: 25.0000 mg | ORAL_TABLET | Freq: Every evening | ORAL | 3 refills | Status: DC | PRN

## 2016-06-25 NOTE — Patient Instructions (Addendum)
For mood: - Start escitalopram 1/2 tablet every morning for 1 week, then take the full tablet daily - Start Trazodone 1/2- 1 tablet at least 30 minutes before bedtime. Ensure you have adequate time for sleep (at least 8 hours before you need to wake up for work, etc.) - Follow-up on Friday If you experience any worsening symptoms or suicidal thoughts, present to [facility], call the [facility] [phone] or go to the nearest emergency room / call [facility] 1-800-SUICIDE   Major Depressive Disorder, Adult Major depressive disorder (MDD) is a mental health condition. MDD often makes you feel sad, hopeless, or helpless. MDD can also cause symptoms in your body. MDD can affect your:  Work.  School.  Relationships.  Other normal activities. MDD can range from mild to very bad. It may occur once (single episode MDD). It can also occur many times (recurrent MDD). The main symptoms of MDD often include:  Feeling sad, depressed, or irritable most of the time.  Loss of interest. MDD symptoms also include:  Sleeping too much or too little.  Eating too much or too little.  A change in your weight.  Feeling tired (fatigue) or having low energy.  Feeling worthless.  Feeling guilty.  Trouble making decisions.  Trouble thinking clearly.  Thoughts of suicide or harming others.  Feeling weak.  Feeling agitated.  Keeping yourself from being around other people (isolation). Follow these instructions at home: Activity   Do these things as told by your doctor:  Go back to your normal activities.  Exercise regularly.  Spend time outdoors. Alcohol   Talk with your doctor about how alcohol can affect your antidepressant medicines.  Do not drink alcohol. Or, limit how much alcohol you drink.  This means no more than 1 drink a day for nonpregnant women and 2 drinks a day for men. One drink equals one of these:  12 oz of  beer.  5 oz of wine.  1 oz of hard liquor. General instructions   Take over-the-counter and prescription medicines only as told by your doctor.  Eat a healthy diet.  Get plenty of sleep.  Find activities that you enjoy. Make time to do them.  Think about joining a support group. Your doctor may be able to suggest a group for you.  Keep all follow-up visits as told by your doctor. This is important. Where to find more information:   [employer] on Mental Illness:  www.nami.org  [place] National Institute of Mental Health:  https://carter.com/  National Suicide Prevention Lifeline:  5181881473. This is free, 24-hour help. Contact a doctor if:  Your symptoms get worse.  You have new symptoms. Get help right away if:  You self-harm.  You see, hear, taste, smell, or feel things that are not present (hallucinate). If you ever feel like you may hurt yourself or others, or have thoughts about taking your own life, get help right away. You can go to your nearest emergency department or call:  Your local emergency services (911 in the [place]  A suicide crisis helpline, such as the [place]:  952-737-8963. This is open 24 hours a day. This information is not intended to replace advice given to you by your health care provider. Make sure you discuss any questions you have with your health care provider. Document Released: 12/25/2014 Document Revised: 09/30/2015 Document Reviewed: 09/30/2015 Elsevier Interactive Patient Education  2017 [employer].
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
## 2016-06-27 ENCOUNTER — Telehealth: Payer: Self-pay

## 2016-06-27 ENCOUNTER — Ambulatory Visit (INDEPENDENT_AMBULATORY_CARE_PROVIDER_SITE_OTHER): Payer: PRIVATE HEALTH INSURANCE | Admitting: Physician Assistant

## 2016-06-27 ENCOUNTER — Encounter: Payer: Self-pay | Admitting: Physician Assistant

## 2016-06-27 VITALS — BP 115/74 | HR 96 | Wt 361.0 lb

## 2016-06-27 DIAGNOSIS — F5105 Insomnia due to other mental disorder: Secondary | ICD-10-CM | POA: Diagnosis not present

## 2016-06-27 DIAGNOSIS — F332 Major depressive disorder, recurrent severe without psychotic features: Secondary | ICD-10-CM | POA: Diagnosis not present

## 2016-06-27 NOTE — Telephone Encounter (Signed)
[facility] called in regards to patient above. Call back number [phone]. -EH/RMA

## 2016-06-29 ENCOUNTER — Encounter: Payer: Self-pay | Admitting: Physician Assistant

## 2016-06-29 NOTE — Progress Notes (Signed)
HPI:                                                                Kristin Love is a 31 y.o. female who presents to [facility]: [place] today for depression follow-up  Patient presents today for 3-day follow-up of severe major depressive disorder. She reports no worsening suicidal ideations. She states Trazodone helped with sleep and she feels more rested today. She is accompanied by her mother and 44-month old daughter. She is interested in possible FMLA, but she does not have short-term disability insurance and would have no source of income to pay her rent or support her family. She is already behind on her rent and facing eviction.    Depression         This is a recurrent problem.  The current episode started more than 1 month ago.   The onset quality is gradual.   The problem occurs constantly.The problem is unchanged.  Associated symptoms include decreased concentration, fatigue, hopelessness, insomnia, irritable, decreased interest, sad and suicidal ideas.     The symptoms are aggravated by social issues and work stress.  Past compliance problems include medication issues.  Risk factors include prior psychiatric admission and stress.    Pertinent negatives include no suicide attempts.  (+ post-partum depression)   Past Medical History:  Diagnosis Date  . Herpes   . Vaginal Pap smear, abnormal    had colposcopy repeat pap negative   Past Surgical History:  Procedure Laterality Date  . CESAREAN SECTION    . CESAREAN SECTION N/A 07/24/2015   Procedure: CESAREAN SECTION;  Surgeon: Kristin Jude, MD;  Location: [facility];  Service: Obstetrics;  Laterality: N/A;   Social History  Substance Use Topics  . Smoking status: Never Smoker  . Smokeless tobacco: Never Used  . Alcohol use No   family history includes Diabetes in her father; Hypertension in her father and mother.  ROS: negative except as noted in the  HPI  Medications: Current Outpatient Prescriptions  Medication Sig Dispense Refill  . escitalopram (LEXAPRO) 20 MG tablet One half tab daily for a week then one tab by mouth daily 30 tablet 3  . levonorgestrel (LILETTA) 18.6 MCG/DAY IUD IUD 1 each by Intrauterine route once.    . metroNIDAZOLE (FLAGYL) 500 MG tablet Take 1 tablet (500 mg total) by mouth 2 (two) times daily. 14 tablet 0  . traZODone (DESYREL) 50 MG tablet Take 0.5-1 tablets (25-50 mg total) by mouth at bedtime as needed for sleep. 30 tablet 3   No current facility-administered medications for this visit.    Allergies  Allergen Reactions  . Shellfish Allergy Anaphylaxis  . Fish Allergy Swelling    Raw shrimp       Objective:  BP 115/74   Pulse 96   Wt (!) 361 lb (163.7 kg)   BMI 56.54 kg/m  Gen:  not ill-appearing, no distress Pulm: Normal work of breathing, normal phonation, clear to auscultation bilaterally CV: Normal rate, regular rhythm, s1 and s2 distinct, no murmurs, clicks or rubs  Neuro: alert and oriented x 3, EOM's intact, no tremor MSK: moving all extremities, normal gait and station, no peripheral edema Psych: appearance well-groomed, cooperative, good eye  contact; fluent speech, mood "sad," affect dysphoric, thought process coherent, thought content ruminating  Depression screen [facility] 2/9 06/25/2016 09/10/2015 08/22/2015  Decreased Interest 3 3 0  Down, Depressed, Hopeless 3 3 0  PHQ - 2 Score 6 6 0  Altered sleeping 3 3 0  Tired, decreased energy 3 3 1   Change in appetite 3 3 0  Feeling bad or failure about yourself  3 3 0  Trouble concentrating 3 1 0  Moving slowly or fidgety/restless 3 1 0  Suicidal thoughts 2 1 0  PHQ-9 Score 26 21 1      No results found for this or any previous visit (from the past 72 hour(s)). No results found.    Assessment and Plan: 31 y.o. female with  1. Severe episode of recurrent major depressive disorder, without psychotic features ([place]) - plan to cont  Lexapro 10mg  for 7 days, then increase to 20mg  daily. Patient contracted for safety. - [name] appears more rested today and is well-groomed and more engaged. We discussed that given finances being one of her biggest stressors, FMLA does not make the most sense at this point.  - I spoke with Kristin Hughs, LCSW, and explained Rickiya's social situation and stressors. He is going to contact Kristin Love directly by phone.  2. Insomnia due to mental condition - cont Trazodone 25-50mg  nightly  Patient education and anticipatory guidance given Patient agrees with treatment plan Follow-up in 4 weeks or sooner as needed if symptoms worsen or fail to improve  Darlyne Russian PA-C

## 2017-02-26 ENCOUNTER — Telehealth: Payer: Self-pay | Admitting: Pediatrics

## 2017-02-26 NOTE — Telephone Encounter (Signed)
Pt called nurse line reporting irregular bleeding that has just started.  She reports having Lyletta x 1 year.  She also reports vaginal pain/swelling at os.  I advised her she would need an appt.  Appt made for 03/04/2017. Pt voiced understanding and agreed with plan.

## 2017-03-04 ENCOUNTER — Other Ambulatory Visit (HOSPITAL_COMMUNITY)
Admission: RE | Admit: 2017-03-04 | Discharge: 2017-03-04 | Disposition: A | Discharge status: Home / Self Care | Payer: PRIVATE HEALTH INSURANCE | Source: Ambulatory Visit | Attending: Obstetrics & Gynecology | Admitting: Obstetrics & Gynecology

## 2017-03-04 ENCOUNTER — Encounter: Payer: Self-pay | Admitting: Obstetrics & Gynecology

## 2017-03-04 ENCOUNTER — Ambulatory Visit (INDEPENDENT_AMBULATORY_CARE_PROVIDER_SITE_OTHER): Payer: No Typology Code available for payment source | Admitting: Obstetrics & Gynecology

## 2017-03-04 VITALS — BP 117/78 | HR 112 | Ht 68.0 in | Wt 379.4 lb

## 2017-03-04 DIAGNOSIS — N898 Other specified noninflammatory disorders of vagina: Secondary | ICD-10-CM | POA: Diagnosis not present

## 2017-03-04 DIAGNOSIS — Z975 Presence of (intrauterine) contraceptive device: Secondary | ICD-10-CM

## 2017-03-04 DIAGNOSIS — Z30432 Encounter for removal of intrauterine contraceptive device: Secondary | ICD-10-CM | POA: Diagnosis not present

## 2017-03-04 NOTE — Progress Notes (Signed)
    GYNECOLOGY OFFICE PROCEDURE NOTE  Kristin Love is a 32 y.o. [phone] here for [place] IUD removal. No GYN concerns.  Last pap smear was on 05/2016 and was normal. She has gained weight and is not sexually active. Patient's last menstrual period was 02/17/2017.   IUD Removal  Patient identified, informed consent performed, consent signed.  Patient was in the dorsal lithotomy position, normal external genitalia was noted.  A speculum was placed in the patient's vagina, normal discharge was noted, no lesions. The cervix was visualized, no lesions, no abnormal discharge.  The strings of the IUD were grasped and pulled using ring forceps. The IUD was removed in its entirety. Patient tolerated the procedure well.   Patient will use abstinence for contraception/  Routine preventative health maintenance measures emphasized.  Woodroe Mode, MD [place], [facility] for [facility], [place]

## 2017-03-04 NOTE — Patient Instructions (Signed)

## 2017-03-06 LAB — CERVICOVAGINAL ANCILLARY ONLY
Bacterial vaginitis: NEGATIVE
Candida vaginitis: NEGATIVE
Chlamydia: NEGATIVE
Neisseria Gonorrhea: NEGATIVE
Trichomonas: NEGATIVE

## 2017-03-09 ENCOUNTER — Telehealth: Payer: Self-pay

## 2017-03-09 NOTE — Telephone Encounter (Signed)
Returned call and advised of lab results.

## 2017-03-17 ENCOUNTER — Telehealth: Payer: Self-pay

## 2017-03-17 DIAGNOSIS — N760 Acute vaginitis: Principal | ICD-10-CM

## 2017-03-17 DIAGNOSIS — B9689 Other specified bacterial agents as the cause of diseases classified elsewhere: Secondary | ICD-10-CM

## 2017-03-17 MED ORDER — METRONIDAZOLE 500 MG PO TABS: 500.0000 mg | ORAL_TABLET | Freq: Two times a day (BID) | ORAL | 0 refills | Status: AC

## 2017-03-17 NOTE — Telephone Encounter (Signed)
Pt called stating that she is having a vaginal discharge, and vaginal odor. Pt request rx. Rx sent to pharmacy

## 2017-04-07 ENCOUNTER — Encounter: Payer: Self-pay | Admitting: *Deleted

## 2017-07-15 ENCOUNTER — Ambulatory Visit (INDEPENDENT_AMBULATORY_CARE_PROVIDER_SITE_OTHER): Payer: No Typology Code available for payment source | Admitting: Advanced Practice Midwife

## 2017-07-15 ENCOUNTER — Encounter: Payer: Self-pay | Admitting: Obstetrics

## 2017-07-15 ENCOUNTER — Encounter: Payer: Self-pay | Admitting: Advanced Practice Midwife

## 2017-07-15 ENCOUNTER — Other Ambulatory Visit (HOSPITAL_COMMUNITY)
Admission: RE | Admit: 2017-07-15 | Discharge: 2017-07-15 | Disposition: A | Discharge status: Home / Self Care | Payer: No Typology Code available for payment source | Source: Ambulatory Visit | Attending: Advanced Practice Midwife | Admitting: Advanced Practice Midwife

## 2017-07-15 VITALS — BP 117/70 | HR 93 | Ht 68.0 in | Wt 386.3 lb

## 2017-07-15 DIAGNOSIS — Z3202 Encounter for pregnancy test, result negative: Secondary | ICD-10-CM

## 2017-07-15 DIAGNOSIS — Z3009 Encounter for other general counseling and advice on contraception: Secondary | ICD-10-CM | POA: Diagnosis not present

## 2017-07-15 DIAGNOSIS — Z86018 Personal history of other benign neoplasm: Secondary | ICD-10-CM

## 2017-07-15 DIAGNOSIS — N939 Abnormal uterine and vaginal bleeding, unspecified: Secondary | ICD-10-CM | POA: Diagnosis not present

## 2017-07-15 DIAGNOSIS — Z124 Encounter for screening for malignant neoplasm of cervix: Secondary | ICD-10-CM

## 2017-07-15 DIAGNOSIS — Z01419 Encounter for gynecological examination (general) (routine) without abnormal findings: Secondary | ICD-10-CM

## 2017-07-15 DIAGNOSIS — Z113 Encounter for screening for infections with a predominantly sexual mode of transmission: Secondary | ICD-10-CM

## 2017-07-15 HISTORY — DX: Abnormal uterine and vaginal bleeding, unspecified: N93.9

## 2017-07-15 LAB — POCT URINE PREGNANCY: Preg Test, Ur: NEGATIVE

## 2017-07-15 NOTE — Progress Notes (Signed)
Pt presents for annual. She would like to discuss heavy bleeding with this current cycle. Pt desires pregnancy test due to late cycle, and std screening

## 2017-07-15 NOTE — Progress Notes (Signed)
Subjective:     Kristin Love is a 32 y.o. female here for a routine well woman exam but also reports recent abnormal bleeding.  Current complaints: Periods are usually regular since [place] IUD out in February 2019 but she was 2 weeks late this month and started having heavier than usual period 4 days ago, lighter today.  She reports her periods are usually heavy but not this heavy. She denies any dizziness, h/a, fatigue, or palpitations.  There are no other symptoms.  She remembers hx of small uterine fibroid with her last pregnancy in 2017.  She has tried Nexplanon and the [place] for contraception but had skin and hair changes and weight gain with both and is concerned about hormones for birth control. She does desire contraception and desires STD testing today. She denies other health concerns and does have a primary care provider.   Gynecologic History Patient's last menstrual period was 07/12/2017. Contraception: none Last Pap: 05/2016. Results were: normal   Obstetric History OB History  Gravida Para Term Preterm AB Living  4 2 2  0 2 2  SAB TAB Ectopic Multiple Live Births  0 2 0 0 2    # Outcome Date GA Lbr Len/2nd Weight Sex Delivery Anes PTL Lv  4 Term 07/24/15 [redacted]w[redacted]d  8 lb 5.5 oz (3.785 kg) F CS-LTranv Spinal  LIV     Birth Comments: congenital umbilical hernia  3 TAB 7782          2 Term 03/01/08 [redacted]w[redacted]d  9 lb 7 oz (4.281 kg) M CS-LTranv  N LIV     Birth Comments: c/s - thinks for long labor  1 TAB 2007             The following portions of the patient's history were reviewed and updated as appropriate: allergies, current medications, past family history, past medical history, past social history, past surgical history and problem list.  Review of Systems Pertinent items noted in HPI and remainder of comprehensive ROS otherwise negative.    Objective:   BP 117/70   Pulse 93   Ht 5\' 8"  (1.727 m)   Wt (!) 386 lb 4.8 oz (175.2 kg)   LMP 07/12/2017   BMI 58.74 kg/m     VS reviewed, nursing note reviewed,  Constitutional: well developed, well nourished, no distress HEENT: normocephalic CV: normal rate Pulm/chest wall: normal effort Breast Exam:  right breast normal without mass, skin or nipple changes or axillary nodes, left breast normal without mass, skin or nipple changes or axillary nodes Abdomen: soft Neuro: alert and oriented x 3 Skin: warm, dry Psych: affect normal Pelvic exam: Cervix pink, visually closed, without lesion, moderate amount dark red bleeding with one fox swab used to visualize cervix and obtain Pap, vaginal walls and external genitalia normal Bimanual exam: Cervix 0/long/high, firm, anterior, neg CMT, uterus nontender, nonenlarged, adnexa without tenderness, enlargement, or mass but exam technically difficult due to body habitus  Assessment:   1. Screening examination for STD (sexually transmitted disease)  - Cytology - PAP with chlamydia, gonorrhea, trichomonas - Hepatitis B surface antigen - Hepatitis C antibody - HIV antibody - RPR  2. Abnormal uterine bleeding (AUB) --Menses usually heavy but one episode of irregular timing, with menses onset 2 weeks late and heavier than usual.  Likely anovulatory cycle.  Pt denies any significant weight gain or weight loss recently.   --Would likely benefit from hormonal contraception but pt does not react well to even low doses in  Liletta/Nexplanon. She reported itching peeling facial skin, significant hair loss, and weight gain with IUD and implant. --Recommend lifestyle changes, including diet and exercise because even slight weight loss may improve menses. -- POCT urine pregnancy - US PELVIS (TRANSABDOMINAL ONLY); Future  3. Encounter for screening for cervical cancer  --Pap  4. Well woman exam with routine gynecological exam --Pap done today with co-testing, discussed cervical cancer screening guidelines with pt.  5. History of uterine fibroid  6.  Contraceptive  Counseling --Discussed contraceptive choices, with LARCs presented as most effective forms of birth control.  Pt did not tolerate even low doses of hormonal contraception well.  Paraguard is good option to have most effective birth control without hormones but is not a good fit for heavy and/or irregular menses.  Otherwise, nonhormonal options include barrier methods which were discussed. Recommend condoms for STD prevention. Pt considering Paraguard and lifestyle changes discussed earlier to decrease estrogen stores.  Will call office for f/u appointment.

## 2017-07-15 NOTE — Patient Instructions (Signed)
Intrauterine Device Information An intrauterine device (IUD) is inserted into your uterus to prevent pregnancy. There are two types of IUDs available:  Copper IUD-This type of IUD is wrapped in copper wire and is placed inside the uterus. Copper makes the uterus and fallopian tubes produce a fluid that kills sperm. The copper IUD can stay in place for 10 years.  Hormone IUD-This type of IUD contains the hormone progestin (synthetic progesterone). The hormone thickens the cervical mucus and prevents sperm from entering the uterus. It also thins the uterine lining to prevent implantation of a fertilized egg. The hormone can weaken or kill the sperm that get into the uterus. One type of hormone IUD can stay in place for 5 years, and another type can stay in place for 3 years.  Your health care provider will make sure you are a good candidate for a contraceptive IUD. Discuss with your health care provider the possible side effects. Advantages of an intrauterine device  IUDs are highly effective, reversible, long acting, and low maintenance.  There are no estrogen-related side effects.  An IUD can be used when breastfeeding.  IUDs are not associated with weight gain.  The copper IUD works immediately after insertion.  The hormone IUD works right away if inserted within 7 days of your period starting. You will need to use a backup method of birth control for 7 days if the hormone IUD is inserted at any other time in your cycle.  The copper IUD does not interfere with your female hormones.  The hormone IUD can make heavy menstrual periods lighter and decrease cramping.  The hormone IUD can be used for 3 or 5 years.  The copper IUD can be used for 10 years. Disadvantages of an intrauterine device  The hormone IUD can be associated with irregular bleeding patterns.  The copper IUD can make your menstrual flow heavier and more painful.  You may experience cramping and vaginal bleeding after  insertion. This information is not intended to replace advice given to you by your health care provider. Make sure you discuss any questions you have with your health care provider. Document Released: 12/18/2003 Document Revised: 06/21/2015 Document Reviewed: 07/04/2012 Elsevier Interactive Patient Education  2017 Elsevier [employer]. Abnormal Uterine Bleeding Abnormal uterine bleeding can affect women at various stages in life, including teenagers, women in their reproductive years, pregnant women, and women who have reached menopause. Several kinds of uterine bleeding are considered abnormal, including:  Bleeding or spotting between periods.  Bleeding after sexual intercourse.  Bleeding that is heavier or more than normal.  Periods that last longer than usual.  Bleeding after menopause.  Many cases of abnormal uterine bleeding are minor and simple to treat, while others are more serious. Any type of abnormal bleeding should be evaluated by your health care provider. Treatment will depend on the cause of the bleeding. Follow these instructions at home: Monitor your condition for any changes. The following actions may help to alleviate any discomfort you are experiencing:  Avoid the use of tampons and douches as directed by your health care provider.  Change your pads frequently.  You should get regular pelvic exams and Pap tests. Keep all follow-up appointments for diagnostic tests as directed by your health care provider. Contact a health care provider if:  Your bleeding lasts more than 1 week.  You feel dizzy at times. Get help right away if:  You pass out.  You are changing pads every 15 to 30 minutes.  You have abdominal pain.  You have a fever.  You become sweaty or weak.  You are passing large blood clots from the vagina.  You start to feel nauseous and vomit. This information is not intended to replace advice given to you by your health care provider. Make sure you  discuss any questions you have with your health care provider. Document Released: 01/13/2005 Document Revised: 06/27/2015 Document Reviewed: 08/12/2012 Elsevier Interactive Patient Education  2017 [employer].

## 2017-07-16 LAB — HIV ANTIBODY (ROUTINE TESTING W REFLEX): HIV Screen 4th Generation wRfx: NONREACTIVE

## 2017-07-16 LAB — HEPATITIS B SURFACE ANTIGEN: Hepatitis B Surface Ag: NEGATIVE

## 2017-07-16 LAB — HEPATITIS C ANTIBODY: Hep C Virus Ab: <0.1 s/co ratio (ref 0.0–0.9)

## 2017-07-16 LAB — RPR: RPR Ser Ql: NONREACTIVE

## 2017-07-17 LAB — CYTOLOGY - PAP
Chlamydia: NEGATIVE
Diagnosis: NEGATIVE
HPV: NOT DETECTED
Neisseria Gonorrhea: NEGATIVE
Trichomonas: POSITIVE — AB

## 2017-07-22 ENCOUNTER — Ambulatory Visit (HOSPITAL_COMMUNITY)
Admission: RE | Admit: 2017-07-22 | Discharge: 2017-07-22 | Disposition: A | Discharge status: Home / Self Care | Payer: PRIVATE HEALTH INSURANCE | Source: Ambulatory Visit | Attending: Advanced Practice Midwife | Admitting: Advanced Practice Midwife

## 2017-07-22 DIAGNOSIS — N939 Abnormal uterine and vaginal bleeding, unspecified: Secondary | ICD-10-CM | POA: Insufficient documentation

## 2017-07-22 DIAGNOSIS — D252 Subserosal leiomyoma of uterus: Secondary | ICD-10-CM | POA: Diagnosis not present

## 2017-07-24 ENCOUNTER — Other Ambulatory Visit: Payer: Self-pay | Admitting: Obstetrics & Gynecology

## 2017-07-24 MED ORDER — METRONIDAZOLE 500 MG PO TABS: ORAL_TABLET | ORAL | 0 refills | Status: DC

## 2017-07-24 NOTE — Progress Notes (Signed)
Pt has BV.  Flagyl prescribed 2 gm x1.  RN from [facility] notifying patient.  [facility] [address]

## 2017-09-10 ENCOUNTER — Ambulatory Visit: Payer: Self-pay | Admitting: Obstetrics and Gynecology

## 2018-01-22 ENCOUNTER — Emergency Department (HOSPITAL_BASED_OUTPATIENT_CLINIC_OR_DEPARTMENT_OTHER): Payer: PRIVATE HEALTH INSURANCE

## 2018-01-22 ENCOUNTER — Emergency Department (HOSPITAL_BASED_OUTPATIENT_CLINIC_OR_DEPARTMENT_OTHER)
Admission: EM | Admit: 2018-01-22 | Discharge: 2018-01-22 | Disposition: A | Discharge status: Home / Self Care | Payer: PRIVATE HEALTH INSURANCE | Attending: Emergency Medicine | Admitting: Emergency Medicine

## 2018-01-22 ENCOUNTER — Encounter (HOSPITAL_BASED_OUTPATIENT_CLINIC_OR_DEPARTMENT_OTHER): Payer: Self-pay | Admitting: *Deleted

## 2018-01-22 ENCOUNTER — Other Ambulatory Visit: Payer: Self-pay

## 2018-01-22 DIAGNOSIS — R05 Cough: Secondary | ICD-10-CM | POA: Insufficient documentation

## 2018-01-22 DIAGNOSIS — R002 Palpitations: Secondary | ICD-10-CM

## 2018-01-22 LAB — CBC WITH DIFFERENTIAL/PLATELET
Abs Immature Granulocytes: 0.03 10*3/uL (ref 0.00–0.07)
Basophils Absolute: 0 10*3/uL (ref 0.0–0.1)
Basophils Relative: 0 %
Eosinophils Absolute: 0.2 10*3/uL (ref 0.0–0.5)
Eosinophils Relative: 2 %
HCT: 40.8 % (ref 36.0–46.0)
Hemoglobin: 12.5 g/dL (ref 12.0–15.0)
Immature Granulocytes: 0 %
Lymphocytes Relative: 21 %
Lymphs Abs: 2.3 10*3/uL (ref 0.7–4.0)
MCH: 26.6 pg (ref 26.0–34.0)
MCHC: 30.6 g/dL (ref 30.0–36.0)
MCV: 86.8 fL (ref 80.0–100.0)
Monocytes Absolute: 0.7 10*3/uL (ref 0.1–1.0)
Monocytes Relative: 7 %
Neutro Abs: 7.5 10*3/uL (ref 1.7–7.7)
Neutrophils Relative %: 70 %
Platelets: 296 10*3/uL (ref 150–400)
RBC: 4.7 MIL/uL (ref 3.87–5.11)
RDW: 13.7 % (ref 11.5–15.5)
WBC: 10.8 10*3/uL — ABNORMAL HIGH (ref 4.0–10.5)
nRBC: 0 % (ref 0.0–0.2)

## 2018-01-22 LAB — BASIC METABOLIC PANEL
Anion gap: 6 (ref 5–15)
BUN: 8 mg/dL (ref 6–20)
CO2: 26 mmol/L (ref 22–32)
Calcium: 8.8 mg/dL — ABNORMAL LOW (ref 8.9–10.3)
Chloride: 105 mmol/L (ref 98–111)
Creatinine, Ser: 0.53 mg/dL (ref 0.44–1.00)
GFR calc Af Amer: >60 mL/min (ref >60)
GFR calc non Af Amer: >60 mL/min (ref >60)
Glucose, Bld: 106 mg/dL — ABNORMAL HIGH (ref 70–99)
Potassium: 3.8 mmol/L (ref 3.5–5.1)
Sodium: 137 mmol/L (ref 135–145)

## 2018-01-22 LAB — TSH: TSH: 1.736 u[IU]/mL (ref 0.350–4.500)

## 2018-01-22 LAB — BRAIN NATRIURETIC PEPTIDE: B Natriuretic Peptide: 15.1 pg/mL (ref 0.0–100.0)

## 2018-01-22 LAB — TROPONIN I: Troponin I: <0.03 ng/mL (ref <0.03)

## 2018-01-22 LAB — HCG, QUANTITATIVE, PREGNANCY: hCG, Beta Chain, Quant, S: <1 m[IU]/mL (ref <5)

## 2018-01-22 NOTE — ED Triage Notes (Signed)
Palpitations on and off for months. Last night it made her cough and "took her breath away'. States her hair is falling out and she gets spots on her skin.

## 2018-01-22 NOTE — ED Notes (Signed)
Handoff report given to Liza RN.
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
## 2018-01-22 NOTE — Discharge Instructions (Signed)
Work-up of your heart and electrolytes were normal.  You may have hormonal dysfunction related leading to your palpitations along with other symptoms of hair loss, change in weight, skin lesions.  Recommend follow-up with PCP for further work-up.

## 2018-01-22 NOTE — ED Notes (Signed)
Pt states "I am having some health issues" my hair is falling out, having issues with my mouth, not feeling well. Pt states she has been having palpitations, worse in the past 3 days. She states that she has had 5-7 episodes in one hour in the past month, which causes her to have SOB with a cough immediately having the chest palpatations, denies chest pain or SOB at other times.

## 2018-01-22 NOTE — ED Notes (Signed)
Pt verbalizes understanding of d/c instructions and denies any further needs at this time. 

## 2018-01-22 NOTE — ED Notes (Signed)
Currently pt ambulates w/o assistance and appears in no distress at this time. Resp even and non-labored, demonstrates no DOE at this time.

## 2018-09-09 ENCOUNTER — Other Ambulatory Visit: Payer: Self-pay

## 2018-09-09 DIAGNOSIS — Z20822 Contact with and (suspected) exposure to covid-19: Secondary | ICD-10-CM

## 2018-09-10 LAB — NOVEL CORONAVIRUS, NAA: SARS-CoV-2, NAA: NOT DETECTED

## 2018-10-28 DIAGNOSIS — R000 Tachycardia, unspecified: Secondary | ICD-10-CM | POA: Insufficient documentation

## 2018-12-21 ENCOUNTER — Other Ambulatory Visit (HOSPITAL_COMMUNITY)
Admission: RE | Admit: 2018-12-21 | Discharge: 2018-12-21 | Disposition: A | Discharge status: Home / Self Care | Payer: PRIVATE HEALTH INSURANCE | Source: Ambulatory Visit | Attending: Obstetrics and Gynecology | Admitting: Obstetrics and Gynecology

## 2018-12-21 ENCOUNTER — Encounter: Payer: Self-pay | Admitting: Obstetrics and Gynecology

## 2018-12-21 ENCOUNTER — Other Ambulatory Visit: Payer: Self-pay

## 2018-12-21 ENCOUNTER — Ambulatory Visit: Payer: 59 | Admitting: Obstetrics and Gynecology

## 2018-12-21 VITALS — BP 131/77 | HR 93 | Ht 68.0 in | Wt 396.8 lb

## 2018-12-21 DIAGNOSIS — Z6841 Body Mass Index (BMI) 40.0 and over, adult: Secondary | ICD-10-CM | POA: Diagnosis not present

## 2018-12-21 DIAGNOSIS — Z3202 Encounter for pregnancy test, result negative: Secondary | ICD-10-CM | POA: Diagnosis not present

## 2018-12-21 DIAGNOSIS — Z202 Contact with and (suspected) exposure to infections with a predominantly sexual mode of transmission: Secondary | ICD-10-CM | POA: Diagnosis not present

## 2018-12-21 DIAGNOSIS — N912 Amenorrhea, unspecified: Secondary | ICD-10-CM

## 2018-12-21 LAB — POCT URINE PREGNANCY: Preg Test, Ur: NEGATIVE

## 2018-12-21 NOTE — Progress Notes (Signed)
Pt reports that her periods starting becoming irregular in 2018. Pt reports she was seeing another provider that gave her progesterone that started her period. Since stopping the progesterone, pt reports she has not had a period. Pt reports she has also been losing the hair on her head for the past 8 months.

## 2018-12-21 NOTE — Patient Instructions (Addendum)
Polycystic Ovarian Syndrome  Polycystic ovarian syndrome (PCOS) is a common hormonal disorder among women of reproductive age. In most women with PCOS, many small fluid-filled sacs (cysts) grow on the ovaries, and the cysts are not part of a normal menstrual cycle. PCOS can cause problems with your menstrual periods and make it difficult to get pregnant. It can also cause an increased risk of miscarriage with pregnancy. If it is not treated, PCOS can lead to serious health problems, such as diabetes and heart disease. What are the causes? The cause of PCOS is not known, but it may be the result of a combination of certain factors, such as:  Irregular menstrual cycle.  High levels of certain hormones (androgens).  Problems with the hormone that helps to control blood sugar (insulin resistance).  Certain genes. What increases the risk? This condition is more likely to develop in women who have a family history of PCOS. What are the signs or symptoms? Symptoms of PCOS may include:  Multiple ovarian cysts.  Infrequent periods or no periods.  Periods that are too frequent or too heavy.  Unpredictable periods.  Inability to get pregnant (infertility) because of not ovulating.  Increased growth of hair on the face, chest, stomach, back, thumbs, thighs, or toes.  Acne or oily skin. Acne may develop during adulthood, and it may not respond to treatment.  Pelvic pain.  Weight gain or obesity.  Patches of thickened and dark brown or black skin on the neck, arms, breasts, or thighs (acanthosis nigricans).  Excess hair growth on the face, chest, abdomen, or upper thighs (hirsutism). How is this diagnosed? This condition is diagnosed based on:  Your medical history.  A physical exam, including a pelvic exam. Your health care provider may look for areas of increased hair growth on your skin.  Tests, such as: ? Ultrasound. This may be used to examine the ovaries and the lining of the  uterus (endometrium) for cysts. ? Blood tests. These may be used to check levels of sugar (glucose), female hormone (testosterone), and female hormones (estrogen and progesterone) in your blood. How is this treated? There is no cure for PCOS, but treatment can help to manage symptoms and prevent more health problems from developing. Treatment varies depending on:  Your symptoms.  Whether you want to have a baby or whether you need birth control (contraception). Treatment may include nutrition and lifestyle changes along with:  Progesterone hormone to start a menstrual period.  Birth control pills to help you have regular menstrual periods.  Medicines to make you ovulate, if you want to get pregnant.  Medicine to reduce excessive hair growth.  Surgery, in severe cases. This may involve making small holes in one or both of your ovaries. This decreases the amount of testosterone that your body produces. Follow these instructions at home:  Take over-the-counter and prescription medicines only as told by your health care provider.  Follow a healthy meal plan. This can help you reduce the effects of PCOS. ? Eat a healthy diet that includes lean proteins, complex carbohydrates, fresh fruits and vegetables, low-fat dairy products, and healthy fats. Make sure to eat enough fiber.  If you are overweight, lose weight as told by your health care provider. ? Losing 10% of your body weight may improve symptoms. ? Your health care provider can determine how much weight loss is best for you and can help you lose weight safely.  Keep all follow-up visits as told by your health care provider.  This is important. Contact a health care provider if:  Your symptoms do not get better with medicine.  You develop new symptoms. This information is not intended to replace advice given to you by your health care provider. Make sure you discuss any questions you have with your health care provider. Document  Released: 05/09/2004 Document Revised: 12/26/2016 Document Reviewed: 07/01/2015 Elsevier Patient Education  2020 [place] for Polycystic Ovary Syndrome Polycystic ovary syndrome (PCOS) is a disorder of the chemicals (hormones) that regulate a woman's reproductive system, including monthly periods (menstruation). The condition causes important hormones to be out of balance. PCOS can:  Stop your periods or make them irregular.  Cause cysts to develop on your ovaries.  Make it difficult to get pregnant.  Stop your body from responding to the effects of insulin (insulin resistance). Insulin resistance can lead to obesity and diabetes. Changing what you eat can help you manage PCOS and improve your health. Following a balanced diet can help you lose weight and improve the way that your body uses insulin. What are tips for following this plan?  Follow a balanced diet for meals and snacks. Eat breakfast, lunch, dinner, and one or two snacks every day.  Include protein in each meal and snack.  Choose whole grains instead of products that are made with refined flour.  Eat a variety of foods.  Exercise regularly as told by your health care provider. Aim to do 30 or more minutes of exercise on most days of the week.  If you are overweight or obese: ? Pay attention to how many calories you eat. Cutting down on calories can help you lose weight. ? Work with your health care provider or a diet and nutrition specialist (dietitian) to figure out how many calories you need each day. What foods can I eat?  Fruits Include a variety of colors and types. All fruits are helpful for PCOS. Vegetables Include a variety of colors and types. All vegetables are helpful for PCOS. Grains Whole grains, such as whole wheat. Whole-grain breads, crackers, cereals, and pasta. Unsweetened oatmeal, bulgur, barley, quinoa, and brown rice. Tortillas made from corn or whole-wheat flour. Meats and other  proteins Low-fat (lean) proteins, such as fish, chicken, beans, eggs, and tofu. Dairy Low-fat dairy products, such as skim milk, cheese sticks, and yogurt. Beverages Low-fat or fat-free drinks, such as water, low-fat milk, sugar-free drinks, and small amounts of 100% fruit juice. Seasonings and condiments Ketchup. Mustard. Barbecue sauce. Relish. Low-fat or fat-free mayonnaise. Fats and oils Olive oil or canola oil. Walnuts and almonds. The items listed above may not be a complete list of recommended foods and beverages. Contact a dietitian for more options. What foods are not recommended? Foods that are high in calories or fat. Fried foods. Sweets. Products that are made from refined white flour, including white bread, pastries, white rice, and pasta. The items listed above may not be a complete list of foods and beverages to avoid. Contact a dietitian for more information. Summary  PCOS is a hormonal imbalance that affects a woman's reproductive system.  You can help to manage your PCOS by exercising regularly and eating a healthy, varied diet of vegetables, fruit, whole grains, low-fat (lean) protein, and low-fat dairy products.  Changing what you eat can improve the way that your body uses insulin, help your hormones reach normal levels, and help you lose weight. This information is not intended to replace advice given to you by your health  care provider. Make sure you discuss any questions you have with your health care provider. Document Released: 05/07/2015 Document Revised: 05/05/2018 Document Reviewed: 11/17/2016 Elsevier Patient Education  2020 [employer].   Contraception Choices Contraception, also called birth control, refers to methods or devices that prevent pregnancy. Hormonal methods Contraceptive implant  A contraceptive implant is a thin, plastic tube that contains a hormone. It is inserted into the upper part of the arm. It can remain in place for up to 3 years.  Progestin-only injections Progestin-only injections are injections of progestin, a synthetic form of the hormone progesterone. They are given every 3 months by a health care provider. Birth control pills  Birth control pills are pills that contain hormones that prevent pregnancy. They must be taken once a day, preferably at the same time each day. Birth control patch  The birth control patch contains hormones that prevent pregnancy. It is placed on the skin and must be changed once a week for three weeks and removed on the fourth week. A prescription is needed to use this method of contraception. Vaginal ring  A vaginal ring contains hormones that prevent pregnancy. It is placed in the vagina for three weeks and removed on the fourth week. After that, the process is repeated with a new ring. A prescription is needed to use this method of contraception. Emergency contraceptive Emergency contraceptives prevent pregnancy after unprotected sex. They come in pill form and can be taken up to 5 days after sex. They work best the sooner they are taken after having sex. Most emergency contraceptives are available without a prescription. This method should not be used as your only form of birth control. Barrier methods Female condom  A female condom is a thin sheath that is worn over the penis during sex. Condoms keep sperm from going inside a woman's body. They can be used with a spermicide to increase their effectiveness. They should be disposed after a single use. Female condom  A female condom is a soft, loose-fitting sheath that is put into the vagina before sex. The condom keeps sperm from going inside a woman's body. They should be disposed after a single use. Diaphragm  A diaphragm is a soft, dome-shaped barrier. It is inserted into the vagina before sex, along with a spermicide. The diaphragm blocks sperm from entering the uterus, and the spermicide kills sperm. A diaphragm should be left in the  vagina for 6-8 hours after sex and removed within 24 hours. A diaphragm is prescribed and fitted by a health care provider. A diaphragm should be replaced every 1-2 years, after giving birth, after gaining more than 15 lb (6.8 kg), and after pelvic surgery. Cervical cap  A cervical cap is a round, soft latex or plastic cup that fits over the cervix. It is inserted into the vagina before sex, along with spermicide. It blocks sperm from entering the uterus. The cap should be left in place for 6-8 hours after sex and removed within 48 hours. A cervical cap must be prescribed and fitted by a health care provider. It should be replaced every 2 years. Sponge  A sponge is a soft, circular piece of polyurethane foam with spermicide on it. The sponge helps block sperm from entering the uterus, and the spermicide kills sperm. To use it, you make it wet and then insert it into the vagina. It should be inserted before sex, left in for at least 6 hours after sex, and removed and thrown away within 28  hours. Spermicides Spermicides are chemicals that kill or block sperm from entering the cervix and uterus. They can come as a cream, jelly, suppository, foam, or tablet. A spermicide should be inserted into the vagina with an applicator at least [id number] minutes before sex to allow time for it to work. The process must be repeated every time you have sex. Spermicides do not require a prescription. Intrauterine contraception Intrauterine device (IUD) An IUD is a T-shaped device that is put in a woman's uterus. There are two types:  Hormone IUD.This type contains progestin, a synthetic form of the hormone progesterone. This type can stay in place for 3-5 years.  Copper IUD.This type is wrapped in copper wire. It can stay in place for 10 years.  Permanent methods of contraception Female tubal ligation In this method, a woman's fallopian tubes are sealed, tied, or blocked during surgery to prevent eggs from traveling to  the uterus. Hysteroscopic sterilization In this method, a small, flexible insert is placed into each fallopian tube. The inserts cause scar tissue to form in the fallopian tubes and block them, so sperm cannot reach an egg. The procedure takes about 3 months to be effective. Another form of birth control must be used during those 3 months. Female sterilization This is a procedure to tie off the tubes that carry sperm (vasectomy). After the procedure, the man can still ejaculate fluid (semen). Natural planning methods Natural family planning In this method, a couple does not have sex on days when the woman could become pregnant. Calendar method This means keeping track of the length of each menstrual cycle, identifying the days when pregnancy can happen, and not having sex on those days. Ovulation method In this method, a couple avoids sex during ovulation. Symptothermal method This method involves not having sex during ovulation. The woman typically checks for ovulation by watching changes in her temperature and in the consistency of cervical mucus. Post-ovulation method In this method, a couple waits to have sex until after ovulation. Summary  Contraception, also called birth control, means methods or devices that prevent pregnancy.  Hormonal methods of contraception include implants, injections, pills, patches, vaginal rings, and emergency contraceptives.  Barrier methods of contraception can include female condoms, female condoms, diaphragms, cervical caps, sponges, and spermicides.  There are two types of IUDs (intrauterine devices). An IUD can be put in a woman's uterus to prevent pregnancy for 3-5 years.  Permanent sterilization can be done through a procedure for males, females, or both.  Natural family planning methods involve not having sex on days when the woman could become pregnant. This information is not intended to replace advice given to you by your health care provider. Make  sure you discuss any questions you have with your health care provider. Document Released: 01/13/2005 Document Revised: 01/15/2017 Document Reviewed: 02/16/2016 Elsevier Patient Education  2020 [employer].

## 2018-12-21 NOTE — Progress Notes (Signed)
33 yo P2 with BMI 60 here for the evaluation of irregular menstrual cycle. Patient reports menstrual cycle became irregular in 2018. [facility] and evaluated [facility]. She reports normal blood work. She responded to a progesterone challenge test. She states she has been amenorrheic since May. She is sexually active without contraception. She denies pelvic pain or abnormal discharge. She has attempted to lose weight without success. Patient states when her period occurs following amenorrhea for a few month, her flow is heavy  Past Medical History:  Diagnosis Date  . Herpes   . Vaginal Pap smear, abnormal    had colposcopy repeat pap negative   Past Surgical History:  Procedure Laterality Date  . CESAREAN SECTION    . CESAREAN SECTION N/A 07/24/2015   Procedure: CESAREAN SECTION;  Surgeon: Donnamae Jude, MD;  Location: [place];  Service: Obstetrics;  Laterality: N/A;   Family History  Problem Relation Age of Onset  . Hypertension Mother   . Hypertension Father   . Diabetes Father    Social History   Tobacco Use  . Smoking status: Never Smoker  . Smokeless tobacco: Never Used  Substance Use Topics  . Alcohol use: No  . Drug use: No   ROS See pertinent in HPI Blood pressure 131/77, pulse 93, height 5\' 8"  (1.727 m), weight (!) 396 lb 12.8 oz (180 kg), last menstrual period 06/15/2018, not currently breastfeeding. GENERAL: Well-developed, well-nourished female in no acute distress.  ABDOMEN: Soft, nontender, non distended. Obese PELVIC: Normal external female genitalia. Vagina is pink and rugated.  Normal discharge.  EXTREMITIES: No cyanosis, clubbing, or edema, 2+ distal pulses.  A/P 33 yo with likely PCOS - Discussed diagnosis - Encouraged patient to continue her weight loss efforts - Patient referred to nutritionist - Patient is considering surgical weight loss interventions such as a gastric sleeve - Discussed contraception for cycle control and  endometrial protection. Patient undecided at this time - STI screening per patient request

## 2018-12-22 LAB — HEPATITIS B SURFACE ANTIGEN: Hepatitis B Surface Ag: NEGATIVE

## 2018-12-22 LAB — CERVICOVAGINAL ANCILLARY ONLY
Bacterial Vaginitis (gardnerella): NEGATIVE
Candida Glabrata: NEGATIVE
Candida Vaginitis: NEGATIVE
Chlamydia: NEGATIVE
Comment: NEGATIVE
Comment: NEGATIVE
Comment: NEGATIVE
Comment: NEGATIVE
Comment: NEGATIVE
Comment: NORMAL
Neisseria Gonorrhea: NEGATIVE
Trichomonas: NEGATIVE

## 2018-12-22 LAB — HIV ANTIBODY (ROUTINE TESTING W REFLEX): HIV Screen 4th Generation wRfx: NONREACTIVE

## 2018-12-22 LAB — RPR: RPR Ser Ql: NONREACTIVE

## 2018-12-22 LAB — HEPATITIS C ANTIBODY: Hep C Virus Ab: <0.1 s/co ratio (ref 0.0–0.9)

## 2019-01-17 ENCOUNTER — Other Ambulatory Visit: Payer: Self-pay

## 2019-01-17 DIAGNOSIS — N898 Other specified noninflammatory disorders of vagina: Secondary | ICD-10-CM

## 2019-01-17 MED ORDER — METRONIDAZOLE 500 MG PO TABS: 500.0000 mg | ORAL_TABLET | Freq: Two times a day (BID) | ORAL | 0 refills | Status: DC

## 2019-01-17 NOTE — Progress Notes (Signed)
Rx sent per protocol Pt made aware.

## 2019-01-19 ENCOUNTER — Ambulatory Visit: Payer: Medicaid Other | Attending: Internal Medicine

## 2019-01-19 DIAGNOSIS — Z20822 Contact with and (suspected) exposure to covid-19: Secondary | ICD-10-CM

## 2019-01-20 LAB — NOVEL CORONAVIRUS, NAA: SARS-CoV-2, NAA: NOT DETECTED

## 2019-01-25 ENCOUNTER — Ambulatory Visit: Payer: Medicaid Other | Attending: Internal Medicine

## 2019-01-25 DIAGNOSIS — Z20822 Contact with and (suspected) exposure to covid-19: Secondary | ICD-10-CM

## 2019-01-26 LAB — NOVEL CORONAVIRUS, NAA: SARS-CoV-2, NAA: NOT DETECTED

## 2019-01-27 ENCOUNTER — Other Ambulatory Visit: Payer: Self-pay

## 2019-01-27 ENCOUNTER — Encounter: Payer: Self-pay | Admitting: Registered"

## 2019-01-27 ENCOUNTER — Encounter: Payer: Medicaid Other | Attending: Obstetrics and Gynecology | Admitting: Registered"

## 2019-01-27 DIAGNOSIS — Z713 Dietary counseling and surveillance: Secondary | ICD-10-CM | POA: Insufficient documentation

## 2019-01-27 NOTE — Patient Instructions (Addendum)
Continue with plan to increase physical activity Aim to eat 3 balanced meals per day Consider inositol for PCOS symptoms Continue with plan to look into sleep apnea test Consider tips on Sleep Hygiene handout

## 2019-01-27 NOTE — Progress Notes (Addendum)
Medical Nutrition Therapy:  Appt start time: 0900 end time:  1000.   Assessment:  Primary concerns today: weight gain, hair loss, fatigue.   Pt states she is looking for a new local PCP since insurance changed.   Diet: Pt states she doesn't feel good if skips breakfast. Due to busy schedule and children's needs, Pt reports she usually skips lunch and has a late dinner. 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  Exercise: ADLs; got a peddle bike exerciser for Christmas and plans to start using.  Other concerns patient states are related to weight: fell in 2015 at a hotel and damaged her right hip. Pt reports 1-2x/month feels like someone is pulling leg out of hip socket and is not able to walk for a bit due to intense pain. Pt reports putting pressure on feet causes throbbing pain in feet and in the ankles.  Sleep: "not good, probably needs a sleep apnea test" hard to get up in the morning even if gets 8 hrs. A little trouble falling asleep takes melatonin every night.  Preferred Learning Style:   No preference indicated   Learning Readiness:   Ready  MEDICATIONS: reviewed   DIETARY INTAKE:  Usual eating pattern includes 2 meals and 1-2 snacks per day.  For 2 weeks was in a hurry for work and was stopping at [employer] for breakfast: sausage, egg, cheese biscuit, reg mt dew, hashbrown, sometimes honeybun.  24-hr recall:  B ( AM): 2 boiled eggs, 2 pieces [place] bacon (not satisfied) Snk ( AM): none OR yogurt & applesauce or PB crackers  L ( PM): none Snk ( PM): none D ( PM): meat (baked chicken, steak, curry lamb) green beans, rice Snk ( PM): PB & jelly sandwich or bowl of cereal Beverages: water, juice, sometimes soda  Usual physical activity: ADLs  Estimated energy needs: not assessed  Progress Towards Goal(s):  New goals.   Nutritional Diagnosis:  NB-1.1  Food and nutrition-related knowledge deficit As related to role of carbohydrates in the diet.  As evidenced by patient stated knowlegde gains during visit.    Intervention:  Nutrition Education. Discussed insulin resistance and role in PCOS. Discussed MyPlate guidance for balanced eating   Ovasitol Sample Provided: Lot: [id number] Exp: 01/2019  Teaching Method Utilized:  Visual Auditory  Handouts given during visit include:  MyPlate planner  What is Inositol  Ovasitol brochure  Sleep Hygiene  Barriers to learning/adherence to lifestyle change: multiple work/family obligations, lack of good quality sleep may create difficulties with changes.  Demonstrated degree of understanding via:  Teach Back   Monitoring/Evaluation:  Dietary intake, exercise, and PCOS symptoms prn.

## 2019-02-10 ENCOUNTER — Other Ambulatory Visit: Payer: Medicaid Other

## 2019-03-21 ENCOUNTER — Other Ambulatory Visit: Payer: Self-pay

## 2019-03-21 ENCOUNTER — Encounter: Payer: Self-pay | Admitting: Obstetrics and Gynecology

## 2019-03-21 ENCOUNTER — Other Ambulatory Visit (HOSPITAL_COMMUNITY)
Admission: RE | Admit: 2019-03-21 | Discharge: 2019-03-21 | Disposition: A | Discharge status: Home / Self Care | Payer: Medicaid Other | Source: Ambulatory Visit | Attending: Obstetrics and Gynecology | Admitting: Obstetrics and Gynecology

## 2019-03-21 ENCOUNTER — Ambulatory Visit: Payer: Medicaid Other | Admitting: Obstetrics and Gynecology

## 2019-03-21 VITALS — BP 113/71 | HR 83 | Wt 399.0 lb

## 2019-03-21 DIAGNOSIS — Z3202 Encounter for pregnancy test, result negative: Secondary | ICD-10-CM

## 2019-03-21 DIAGNOSIS — N930 Postcoital and contact bleeding: Secondary | ICD-10-CM | POA: Diagnosis not present

## 2019-03-21 DIAGNOSIS — Z124 Encounter for screening for malignant neoplasm of cervix: Secondary | ICD-10-CM

## 2019-03-21 LAB — POCT URINE PREGNANCY: Preg Test, Ur: NEGATIVE

## 2019-03-21 MED ORDER — VALACYCLOVIR HCL 500 MG PO TABS: 500.0000 mg | ORAL_TABLET | Freq: Every day | ORAL | 12 refills | Status: DC

## 2019-03-21 MED ORDER — BALCOLTRA 0.1-20 MG-MCG(21) PO TABS: 1.0000 | ORAL_TABLET | Freq: Every day | ORAL | 4 refills | Status: DC

## 2019-03-21 NOTE — Progress Notes (Signed)
34 yo P2 here for the evaluation of postcoital vaginal bleeding. Patient reports 2-week history of intermittent vaginal bleeding following intercourse. She reports the bleeding was heavy enough to wear a pad the first day but was later noted when wiping only. She denies any pelvic pain. Patient has been amenorrheic since May 2020. She is interested in contraception. Patient is without any other complaints  Past Medical History:  Diagnosis Date  . Herpes   . Vaginal Pap smear, abnormal    had colposcopy repeat pap negative   Past Surgical History:  Procedure Laterality Date  . CESAREAN SECTION    . CESAREAN SECTION N/A 07/24/2015   Procedure: CESAREAN SECTION;  Surgeon: Donnamae Jude, MD;  Location: [place];  Service: Obstetrics;  Laterality: N/A;   Family History  Problem Relation Age of Onset  . Hypertension Mother   . Hypertension Father   . Diabetes Father    Social History   Tobacco Use  . Smoking status: Never Smoker  . Smokeless tobacco: Never Used  Substance Use Topics  . Alcohol use: No  . Drug use: No   ROS See pertinent in HPI  Blood pressure 113/71, pulse 83, weight (!) 399 lb (181 kg). GENERAL: Well-developed, well-nourished female in no acute distress.  ABDOMEN: Soft, nontender, nondistended. No organomegaly. PELVIC: Normal external female genitalia. Vagina is pink and rugated.  Normal discharge. Normal appearing cervix. Uterus is normal in size. No adnexal mass or tenderness. EXTREMITIES: No cyanosis, clubbing, or edema, 2+ distal pulses.  A/P 34 yo with postcoital bleeding - vaginal swab collected - pap smear collected - Patient will be contacted with abnormal results - RTC prn

## 2019-03-23 LAB — CERVICOVAGINAL ANCILLARY ONLY
Bacterial Vaginitis (gardnerella): POSITIVE — AB
Candida Glabrata: NEGATIVE
Candida Vaginitis: NEGATIVE
Chlamydia: NEGATIVE
Comment: NEGATIVE
Comment: NEGATIVE
Comment: NEGATIVE
Comment: NEGATIVE
Comment: NEGATIVE
Comment: NORMAL
Neisseria Gonorrhea: NEGATIVE
Trichomonas: NEGATIVE

## 2019-03-23 LAB — URINE CULTURE

## 2019-03-23 MED ORDER — METRONIDAZOLE 500 MG PO TABS: 500.0000 mg | ORAL_TABLET | Freq: Two times a day (BID) | ORAL | 0 refills | Status: DC

## 2019-03-23 NOTE — Addendum Note (Signed)
Addended by: Mora Bellman on: 03/23/2019 12:12 PM   Modules accepted: Orders

## 2019-03-24 LAB — CYTOLOGY - PAP
Comment: NEGATIVE
Diagnosis: NEGATIVE
High risk HPV: NEGATIVE

## 2019-03-28 ENCOUNTER — Ambulatory Visit (INDEPENDENT_AMBULATORY_CARE_PROVIDER_SITE_OTHER): Payer: Self-pay | Admitting: Bariatrics

## 2019-04-11 ENCOUNTER — Ambulatory Visit (INDEPENDENT_AMBULATORY_CARE_PROVIDER_SITE_OTHER): Payer: Self-pay | Admitting: Bariatrics

## 2019-05-28 ENCOUNTER — Ambulatory Visit: Payer: 59 | Attending: Internal Medicine

## 2019-06-10 ENCOUNTER — Other Ambulatory Visit: Payer: Self-pay | Admitting: *Deleted

## 2019-06-10 DIAGNOSIS — Z1231 Encounter for screening mammogram for malignant neoplasm of breast: Secondary | ICD-10-CM

## 2019-06-14 ENCOUNTER — Ambulatory Visit
Admission: RE | Admit: 2019-06-14 | Discharge: 2019-06-14 | Disposition: A | Discharge status: Home / Self Care | Payer: 59 | Source: Ambulatory Visit | Attending: *Deleted | Admitting: *Deleted

## 2019-06-14 ENCOUNTER — Other Ambulatory Visit: Payer: Self-pay

## 2019-06-14 DIAGNOSIS — Z1231 Encounter for screening mammogram for malignant neoplasm of breast: Secondary | ICD-10-CM

## 2019-08-23 ENCOUNTER — Other Ambulatory Visit: Payer: Self-pay | Admitting: Obstetrics and Gynecology

## 2019-09-05 ENCOUNTER — Encounter: Payer: Self-pay | Admitting: Obstetrics

## 2019-09-05 ENCOUNTER — Ambulatory Visit: Payer: 59

## 2019-09-05 ENCOUNTER — Other Ambulatory Visit: Payer: Self-pay

## 2019-09-05 ENCOUNTER — Other Ambulatory Visit (HOSPITAL_COMMUNITY)
Admission: RE | Admit: 2019-09-05 | Discharge: 2019-09-05 | Disposition: A | Discharge status: Home / Self Care | Payer: 59 | Source: Ambulatory Visit | Attending: Obstetrics and Gynecology | Admitting: Obstetrics and Gynecology

## 2019-09-05 DIAGNOSIS — N898 Other specified noninflammatory disorders of vagina: Secondary | ICD-10-CM | POA: Diagnosis not present

## 2019-09-05 NOTE — Progress Notes (Signed)
SUBJECTIVE:  34 y.o. female complains of  vaginal discharge and odor a few weeks now Denies abnormal vaginal bleeding or significant pelvic pain or fever. No UTI symptoms. Denies history of known exposure to STD.  No LMP recorded. (Menstrual status: Irregular Periods).  OBJECTIVE:  She appears well, afebrile. Urine dipstick: not done.  ASSESSMENT:  Vaginal Discharge  Vaginal Odor   PLAN:  GC, chlamydia, trichomonas, BVAG, CVAG probe sent to lab. Treatment: To be determined once lab results are received ROV prn if symptoms persist or worsen.

## 2019-09-06 LAB — CERVICOVAGINAL ANCILLARY ONLY
Bacterial Vaginitis (gardnerella): POSITIVE — AB
Candida Glabrata: NEGATIVE
Candida Vaginitis: NEGATIVE
Chlamydia: NEGATIVE
Comment: NEGATIVE
Comment: NEGATIVE
Comment: NEGATIVE
Comment: NEGATIVE
Comment: NEGATIVE
Comment: NORMAL
Neisseria Gonorrhea: NEGATIVE
Trichomonas: NEGATIVE

## 2019-09-08 ENCOUNTER — Other Ambulatory Visit: Payer: Self-pay | Admitting: Obstetrics & Gynecology

## 2019-09-08 DIAGNOSIS — N898 Other specified noninflammatory disorders of vagina: Secondary | ICD-10-CM

## 2019-09-08 MED ORDER — METRONIDAZOLE 500 MG PO TABS: 500.0000 mg | ORAL_TABLET | Freq: Two times a day (BID) | ORAL | 0 refills | Status: DC

## 2019-09-08 NOTE — Progress Notes (Signed)
Patient ID: [name], female   DOB: 09/02/1985, 34 y.o.   MRN: [id number] Patient was assessed and managed by nursing staff during this encounter. I have reviewed the chart and agree with the documentation and plan. I have also made any necessary editorial changes.  Emeterio Reeve, MD 09/08/2019 3:30 PM

## 2019-10-04 ENCOUNTER — Other Ambulatory Visit: Payer: Self-pay

## 2019-10-04 MED ORDER — METRONIDAZOLE 500 MG PO TABS: 500.0000 mg | ORAL_TABLET | Freq: Two times a day (BID) | ORAL | 0 refills | Status: DC

## 2019-10-04 NOTE — Telephone Encounter (Signed)
Patient thinks she has BV again and would like another round of flagyl 500 mg BID to be called into her pharmacy. She reports not taking medication as instructed. I have advised her I will call another round of flagyl into her pharmacy  if her symptoms do not go away she will need to call back and schedule an appointment to be seen. Patient voice understanding.

## 2019-11-03 ENCOUNTER — Other Ambulatory Visit: Payer: Self-pay

## 2019-11-03 MED ORDER — METRONIDAZOLE 500 MG PO TABS: 500.0000 mg | ORAL_TABLET | Freq: Two times a day (BID) | ORAL | 0 refills | Status: DC

## 2019-11-03 NOTE — Telephone Encounter (Signed)
Patient would like something called in for BV. I will send flagyl to her pharmacy to help with her symptoms. Patient should follow up in a month if she does get better.

## 2019-11-09 ENCOUNTER — Other Ambulatory Visit: Payer: Self-pay

## 2019-11-09 ENCOUNTER — Ambulatory Visit (INDEPENDENT_AMBULATORY_CARE_PROVIDER_SITE_OTHER): Payer: 59 | Admitting: Nurse Practitioner

## 2019-11-09 ENCOUNTER — Encounter: Payer: Self-pay | Admitting: Nurse Practitioner

## 2019-11-09 VITALS — BP 126/80 | HR 102 | Temp 98.1°F | Ht 68.2 in | Wt >= 6400 oz

## 2019-11-09 DIAGNOSIS — Z8742 Personal history of other diseases of the female genital tract: Secondary | ICD-10-CM

## 2019-11-09 DIAGNOSIS — Z87892 Personal history of anaphylaxis: Secondary | ICD-10-CM

## 2019-11-09 DIAGNOSIS — G4452 New daily persistent headache (NDPH): Secondary | ICD-10-CM | POA: Diagnosis not present

## 2019-11-09 DIAGNOSIS — Z6841 Body Mass Index (BMI) 40.0 and over, adult: Secondary | ICD-10-CM

## 2019-11-09 DIAGNOSIS — R0683 Snoring: Secondary | ICD-10-CM | POA: Diagnosis not present

## 2019-11-09 DIAGNOSIS — R635 Abnormal weight gain: Secondary | ICD-10-CM

## 2019-11-09 DIAGNOSIS — N926 Irregular menstruation, unspecified: Secondary | ICD-10-CM | POA: Diagnosis not present

## 2019-11-09 MED ORDER — MAGNESIUM 250 MG PO TABS: 1.0000 | ORAL_TABLET | Freq: Every day | ORAL | 0 refills | Status: DC

## 2019-11-09 MED ORDER — EPINEPHRINE 0.3 MG/0.3ML IJ SOAJ: 0.3000 mg | INTRAMUSCULAR | 2 refills | Status: AC | PRN

## 2019-11-09 NOTE — Patient Instructions (Signed)
Download myfitness pal to log your meals and exercise.

## 2019-11-09 NOTE — Progress Notes (Signed)
I,Kristin Love as a Education administrator for [employer], FNP.,have documented all relevant documentation on the behalf of Kristin Brine, FNP,as directed by  Kristin Brine, FNP while in the presence of Kristin Love, [place]. This visit occurred during the SARS-CoV-2 public health emergency.  Safety protocols were in place, including screening questions prior to the visit, additional usage of staff PPE, and extensive cleaning of exam room while observing appropriate contact time as indicated for disinfecting solutions.  Subjective:     Patient ID: Kristin Love , female    DOB: August 21, 1985 , 34 y.o.   MRN: [id number]   Chief Complaint  Patient presents with  . Establish Care  . abnormal periods    patient stated she has been having irregular periods for the past couple years has seen an obgyn for it but she would like a second opinion.  . Migraine    she stated she has been having headaches that have been waking her up at night     HPI  She was referred by Pleas Patricia her mother.    She is here to establish care had been going [facility] with a PCP. She seen them last year in June or August.  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  She is single. She has two children 11 and 4 - her son has ADHD.   PMH -   She went to urgent care on 9/26 after falling and injured her right knee (that is the third injury to her knee).  She has seen [facility] in the past. She is not on any birth control - denies being sexually active. She would like a second opinion.  She was told she has PCOS after not having periods.  She has been doing smoothie, apples with peanut butter. Lunch will have sandwich meat with cheese. Bananas, grapes snack.  Dinner - eat regular without fried foods.  She is limiting starches. She has gained 17 lbs in 3-4 months.  She was exercising every other day.  She tells me that she snores and will sometimes  stop.    She has tried to go to [employer].    Goal to lose 200 lbs - 2 years.  She does admit to being a stress eater.    Headache  This is a new problem. The current episode started more than 1 month ago (6 months). The problem occurs intermittently. The problem has been gradually worsening. The pain is located in the left unilateral region. The pain does not radiate. The pain quality is not similar to prior headaches. The quality of the pain is described as throbbing. The pain is mild. Pertinent negatives include no abdominal pain or dizziness. Nothing aggravates the symptoms. Her past medical history is significant for obesity. There is no history of cancer, cluster headaches or hypertension.     Past Medical History:  Diagnosis Date  . Diabetes mellitus without complication ([place])   . Herpes   . PCOS (polycystic ovarian syndrome)   . Vaginal Pap smear, abnormal    had colposcopy repeat pap negative     Family History  Problem Relation Age of Onset  . Hypertension Mother   . Hypertension Father   . Diabetes Father   . Diabetes Maternal Grandmother   . Hypertension Maternal Grandmother   . Diabetes Maternal Grandfather   . Hypertension Maternal Grandfather   . Congestive Heart Failure Maternal  Grandfather   . Diabetes Paternal Grandmother   . Hypertension Paternal Grandmother   . Diabetes Paternal Grandfather   . Hypertension Paternal Grandfather   . Stroke Paternal Grandfather      Current Outpatient Medications:  Marland Kitchen  Multiple Vitamins-Minerals (WOMENS MULTIVITAMIN PO), Take by mouth. (Patient not taking: Reported on 12/21/2019), Disp: , Rfl:  .  valACYclovir (VALTREX) 500 MG tablet, Take 1 tablet (500 mg total) by mouth daily. Can increase to twice a day for 5 days in the event of a recurrence, Disp: 30 tablet, Rfl: 12 .  blood glucose meter kit and supplies KIT, Dispense based on patient and insurance preference. Check blood sugar two times a day and as needed  (FOR ICD-9 250.00, 250.01)., Disp: 1 each, Rfl: 0 .  diclofenac Sodium (VOLTAREN) 1 % GEL, Apply 2 g topically 4 (four) times daily., Disp: 100 g, Rfl: 2 .  EPINEPHrine 0.3 mg/0.3 mL IJ SOAJ injection, Inject 0.3 mg into the muscle as needed for anaphylaxis., Disp: 1 each, Rfl: 2 .  Magnesium 250 MG TABS, Take 1 tablet (250 mg total) by mouth daily. Take with evening meal (Patient not taking: Reported on 12/21/2019), Disp: 30 tablet, Rfl: 0 .  megestrol (MEGACE) 40 MG tablet, Take 1 tablet (40 mg total) by mouth 2 (two) times daily., Disp: 30 tablet, Rfl: 6 .  metFORMIN (GLUCOPHAGE) 500 MG tablet, Take 1 tablet (500 mg total) by mouth 2 (two) times daily with a meal., Disp: 60 tablet, Rfl: 1 .  Semaglutide,0.25 or 0.5MG/DOS, (OZEMPIC, 0.25 OR 0.5 MG/DOSE,) 2 MG/1.5ML SOPN, Inject 0.5 mg into the skin once a week., Disp: 4.5 mL, Rfl: 1   Allergies  Allergen Reactions  . Shellfish Allergy Anaphylaxis  . Fish Allergy Swelling    Raw shrimp     Review of Systems  Constitutional: Negative.   Respiratory: Negative.   Cardiovascular: Negative.   Gastrointestinal: Negative for abdominal pain.  Genitourinary:       Abnormal menstrual cycles  Musculoskeletal: Negative.   Neurological: Positive for headaches. Negative for dizziness.  Psychiatric/Behavioral: Negative.      Today's Vitals   11/09/19 1153  BP: 126/80  Pulse: (!) 102  Temp: 98.1 F (36.7 C)  TempSrc: Oral  Weight: (!) 417 lb 9.6 oz (189.4 kg)  Height: 5' 8.2" (1.732 m)  PainSc: 0-No pain   Body mass index is 63.12 kg/m.   Objective:  Physical Exam Vitals reviewed.  Constitutional:      General: She is not in acute distress.    Appearance: Normal appearance.  Cardiovascular:     Rate and Rhythm: Normal rate and regular rhythm.     Pulses: Normal pulses.     Heart sounds: Normal heart sounds. No murmur heard.   Pulmonary:     Effort: Pulmonary effort is normal. No respiratory distress.     Breath sounds: Normal  breath sounds.  Neurological:     General: No focal deficit present.     Mental Status: She is alert and oriented to person, place, and time.     Cranial Nerves: No cranial nerve deficit.  Psychiatric:        Mood and Affect: Mood normal.        Behavior: Behavior normal.        Thought Content: Thought content normal.        Judgment: Judgment normal.         Assessment And Plan:     1. New daily persistent headache  I have encouraged her to take magnesium with evening meal  Also she is encouraged to go for an eye exam as she has had some weight gain - Ambulatory referral to Sleep Studies - Magnesium 250 MG TABS; Take 1 tablet (250 mg total) by mouth daily. Take with evening meal (Patient not taking: Reported on 12/21/2019)  Dispense: 30 tablet; Refill: 0  2. Snoring  Will refer for sleep study  - Ambulatory referral to Sleep Studies  3. Irregular menses  She would like to see another gyn   4. Class 3 severe obesity with body mass index (BMI) of 60.0 to 69.9 in adult, unspecified obesity type, unspecified whether serious comorbidity present (HCC)  Chronic  Discussed healthy diet and regular exercise options   Encouraged to exercise at least 150 minutes per week with 2 days of strength training - Insulin, random - Hemoglobin A1c  5. Abnormal weight gain  I will check thyroid levels and HgbA1c - TSH - Thyroid antibodies - Hemoglobin A1c  6. History of PCOS  Will check labs pending results will refer to GYN for further evaluation - Insulin, random  7. History of anaphylaxis - EPINEPHrine 0.3 mg/0.3 mL IJ SOAJ injection; Inject 0.3 mg into the muscle as needed for anaphylaxis.  Dispense: 1 each; Refill: 2     Patient was given opportunity to ask questions. Patient verbalized understanding of the plan and was able to repeat key elements of the plan. All questions were answered to their satisfaction.  Kristin Brine, FNP   I, Kristin Brine, FNP, have reviewed  all documentation for this visit. The documentation on 01/26/20 for the exam, diagnosis, procedures, and orders are all accurate and complete.   THE PATIENT IS ENCOURAGED TO PRACTICE SOCIAL DISTANCING DUE TO THE COVID-19 PANDEMIC.

## 2019-11-10 ENCOUNTER — Telehealth: Payer: Self-pay | Admitting: Neurology

## 2019-11-10 LAB — HEMOGLOBIN A1C
Est. average glucose Bld gHb Est-mCnc: 157 mg/dL
Hgb A1c MFr Bld: 7.1 % — ABNORMAL HIGH (ref 4.8–5.6)

## 2019-11-10 LAB — TSH: TSH: 1.64 u[IU]/mL (ref 0.450–4.500)

## 2019-11-10 LAB — INSULIN, RANDOM: INSULIN: 77.2 u[IU]/mL — ABNORMAL HIGH (ref 2.6–24.9)

## 2019-11-10 LAB — THYROID ANTIBODIES
Thyroglobulin Antibody: <1 IU/mL (ref 0.0–0.9)
Thyroperoxidase Ab SerPl-aCnc: <8 IU/mL (ref 0–34)

## 2019-11-10 NOTE — Telephone Encounter (Signed)
I called the pt to get her scheduled and to inquire about her insurance. She stated [employer]in Love is primary and Medicaid is secondary. I informed her that her Medicaid [employer] is out of network for a [facility] facility. She verbalized understanding. She stated she still wanted to proceed with our office. She asked if she could just use her [place].  I told her we could I just wanted to check with her to make sure she was aware her Medicaid was out of network.

## 2019-11-18 MED ORDER — BLOOD GLUCOSE MONITOR KIT: PACK | 0 refills | Status: DC

## 2019-11-18 MED ORDER — METFORMIN HCL 500 MG PO TABS: 500.0000 mg | ORAL_TABLET | Freq: Two times a day (BID) | ORAL | 1 refills | Status: DC

## 2019-11-29 DIAGNOSIS — N898 Other specified noninflammatory disorders of vagina: Secondary | ICD-10-CM

## 2019-11-29 MED ORDER — METRONIDAZOLE 0.75 % VA GEL: 1.0000 | Freq: Every day | VAGINAL | 0 refills | Status: DC

## 2019-12-07 ENCOUNTER — Ambulatory Visit: Payer: 59 | Admitting: Neurology

## 2019-12-07 ENCOUNTER — Encounter: Payer: Self-pay | Admitting: Neurology

## 2019-12-07 VITALS — BP 118/84 | HR 100 | Ht 67.0 in | Wt >= 6400 oz

## 2019-12-07 DIAGNOSIS — J0130 Acute sphenoidal sinusitis, unspecified: Secondary | ICD-10-CM

## 2019-12-07 DIAGNOSIS — E662 Morbid (severe) obesity with alveolar hypoventilation: Secondary | ICD-10-CM | POA: Insufficient documentation

## 2019-12-07 DIAGNOSIS — R0683 Snoring: Secondary | ICD-10-CM | POA: Diagnosis not present

## 2019-12-07 DIAGNOSIS — G44019 Episodic cluster headache, not intractable: Secondary | ICD-10-CM | POA: Diagnosis not present

## 2019-12-07 DIAGNOSIS — E669 Obesity, unspecified: Secondary | ICD-10-CM | POA: Insufficient documentation

## 2019-12-07 DIAGNOSIS — R519 Headache, unspecified: Secondary | ICD-10-CM | POA: Insufficient documentation

## 2019-12-07 DIAGNOSIS — E282 Polycystic ovarian syndrome: Secondary | ICD-10-CM | POA: Insufficient documentation

## 2019-12-07 HISTORY — DX: Acute sphenoidal sinusitis, unspecified: J01.30

## 2019-12-07 NOTE — Progress Notes (Signed)
SLEEP MEDICINE CLINIC    Provider:  Larey Seat, MD  Primary Care Physician:  Minette Brine, [address] [place] [address]     Referring Provider: Minette Brine, [address] [place],  [place] [address]          Chief Complaint according to patient   Patient presents with:    . New Patient (Initial Visit)     presents today to assess OSA concerns. she states that she snores in sleep and has been told sounds like she gasps for air. never had a SS. wakes up with bad headaches and dry mouth.      HISTORY OF PRESENT ILLNESS:  Kristin Love is a 34  year old African American female patient and [employer] employee ,who seen here upon referral on 12/07/2019 from Minette Brine, [place]. Marland Kitchen  Chief concern according to patient :  " she has lost 13 pounds since nutrition visit with PCP, started on metformin, PCOS diagnosed. Goal is to check for OSA in the setting of weight loss and wellness. She is a snorer and her family has witnessed Apneas.      I have the pleasure of seeing [name] on 12-07-2019, a right -handed  African American female who has a past medical history of Herpes labialis, super obesity, status post caesarian sections,  and Vaginal Pap smear, abnormal.   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    Family medical /sleep history: mother with OSA.  Social history:  Patient is working as a Marketing executive in a [employer] and lives in a household with her 2 children, single.  The patient currently works in daytime. No Pets . Tobacco use; vapor, once every 14 days.Marland Kitchen   ETOH use:  2 a year. Caffeine intake in form of crystal light caffeine, only when "super tired", other wise red bull.  Regular exercise in form of walking. Likes to swim.    Sleep habits are as follows: The patient's dinner time is between 7.30 PM.  The patient goes to bed at 12 PM and continues to  sleep for intervals of 3-4 hours, wakes once or none for bathroom breaks. Cluster headaches can wake her.  Bedroom is dark, has a fan, cool.   The preferred sleep position is prone but ends up supine,  with the support of 3 pillows.  Dreams are reportedly frequent/vivid.Marland Kitchen  5.45 AM is the usual rise time. The patient wakes up with an alarm.  She reports not feeling refreshed or restored in AM, with symptoms such as dry mouth , morning headaches ( and reports headaches that wake her, sharp, focal pounding or stabbing 0 , and residual fatigue.  Naps are taken very infrequently,  And only on weekends - lasting from 2 hours -*less refreshing than nocturnal sleep.    Review of Systems: Out of a complete 14 system review, the patient complains of only the following symptoms, and all other reviewed systems are negative.:  Fatigue, sleepiness , snoring, cluster headaches, morning headaches .    How likely are you to doze in the following situations: 0 = not likely, 1 = slight chance, 2 = moderate chance, 3 = high chance   Sitting and Reading? Watching Television? Sitting inactive in a public place (theater or meeting)? As a passenger in a car for an hour without a break? Lying down in the afternoon when  circumstances permit? Sitting and talking to someone? Sitting quietly after lunch without alcohol? In a car, while stopped for a few minutes in traffic?   Total = 9 / 24 points   FSS endorsed at 54/ 63 points.   Social History   Socioeconomic History  . Marital status: Single    Spouse name: Not on file  . Number of children: Not on file  . Years of education: Not on file  . Highest education level: Not on file  Occupational History  . Not on file  Tobacco Use  . Smoking status: Never Smoker  . Smokeless tobacco: Never Used  Vaping Use  . Vaping Use: Never used  Substance and Sexual Activity  . Alcohol use: No  . Drug use: No  . Sexual activity: Yes    Partners: Male    Birth  control/protection: None  Other Topics Concern  . Not on file  Social History Narrative  . Not on file   Social Determinants of Health   Financial Resource Strain:   . Difficulty of Paying Living Expenses: Not on file  Food Insecurity:   . Worried About Charity fundraiser in the Last Year: Not on file  . Ran Out of Food in the Last Year: Not on file  Transportation Needs:   . Lack of Transportation (Medical): Not on file  . Lack of Transportation (Non-Medical): Not on file  Physical Activity:   . Days of Exercise per Week: Not on file  . Minutes of Exercise per Session: Not on file  Stress:   . Feeling of Stress : Not on file  Social Connections:   . Frequency of Communication with Friends and Family: Not on file  . Frequency of Social Gatherings with Friends and Family: Not on file  . Attends Religious Services: Not on file  . Active Member of Clubs or Organizations: Not on file  . Attends Archivist Meetings: Not on file  . Marital Status: Not on file    Family History  Problem Relation Age of Onset  . Hypertension Mother   . Hypertension Father   . Diabetes Father   . Diabetes Maternal Grandmother   . Hypertension Maternal Grandmother   . Diabetes Maternal Grandfather   . Hypertension Maternal Grandfather   . Congestive Heart Failure Maternal Grandfather   . Diabetes Paternal Grandmother   . Hypertension Paternal Grandmother   . Diabetes Paternal Grandfather   . Hypertension Paternal Grandfather   . Stroke Paternal Grandfather     Past Medical History:  Diagnosis Date  . Herpes   . Vaginal Pap smear, abnormal    had colposcopy repeat pap negative    Past Surgical History:  Procedure Laterality Date  . CESAREAN SECTION    . CESAREAN SECTION N/A 07/24/2015   Procedure: CESAREAN SECTION;  Surgeon: Donnamae Jude, MD;  Location: [facility];  Service: Obstetrics;  Laterality: N/A;     Current Outpatient Medications on File Prior to Visit    Medication Sig Dispense Refill  . blood glucose meter kit and supplies KIT Dispense based on patient and insurance preference. Check blood sugar two times a day and as needed (FOR ICD-9 250.00, 250.01). 1 each 0  . EPINEPHrine 0.3 mg/0.3 mL IJ SOAJ injection Inject 0.3 mg into the muscle as needed for anaphylaxis. 1 each 2  . Magnesium 250 MG TABS Take 1 tablet (250 mg total) by mouth daily. Take with evening meal 30 tablet 0  .  metFORMIN (GLUCOPHAGE) 500 MG tablet Take 1 tablet (500 mg total) by mouth 2 (two) times daily with a meal. 60 tablet 1  . metroNIDAZOLE (FLAGYL) 500 MG tablet Take 1 tablet (500 mg total) by mouth 2 (two) times daily. 14 tablet 0  . metroNIDAZOLE (METROGEL) 0.75 % vaginal gel Place 1 Applicatorful vaginally at bedtime. Apply one applicatorful to vagina at bedtime for 5 days 70 g 0  . Multiple Vitamins-Minerals (WOMENS MULTIVITAMIN PO) Take by mouth.    . valACYclovir (VALTREX) 500 MG tablet Take 1 tablet (500 mg total) by mouth daily. Can increase to twice a day for 5 days in the event of a recurrence 30 tablet 12   No current facility-administered medications on file prior to visit.    Allergies  Allergen Reactions  . Shellfish Allergy Anaphylaxis  . Fish Allergy Swelling    Raw shrimp    Physical exam:  Today's Vitals   12/07/19 1251  BP: 118/84  Pulse: 100  Weight: (!) 404 lb (183.3 kg)  Height: 5' 7" (1.702 m)   Body mass index is 63.28 kg/m.   Wt Readings from Last 3 Encounters:  12/07/19 (!) 404 lb (183.3 kg)  11/09/19 (!) 417 lb 9.6 oz (189.4 kg)  03/21/19 (!) 399 lb (181 kg)     Ht Readings from Last 3 Encounters:  12/07/19 5' 7" (1.702 m)  11/09/19 5' 8.2" (1.732 m)  12/21/18 5' 8" (1.727 m)      General: The patient is awake, alert and appears not in acute distress. The patient is well groomed. Head: Normocephalic, atraumatic. Neck is supple.  Mallampati 3 plus,  neck circumference:20 inches .  Nasal airflow barely patent.    Retrognathia is  seen. Small mouth. Adenoids, post nasal drip.  Dental status:  Cardiovascular:  Regular rate and cardiac rhythm by pulse,  without distended neck veins. Respiratory: Lungs are clear to auscultation.  Skin:  Without evidence of ankle edema, or rash. Trunk: The patient's posture is erect.   Neurologic exam : The patient is awake and alert, oriented to place and time.   Memory subjective described as intact.  Attention span & concentration ability appears normal.  Speech is fluent,  without  dysarthria, dysphonia or aphasia.  Mood and affect are appropriate.   Cranial nerves:  loss of smell or taste reported- in July 2021. She had covid shots before- in June and  May.  Pupils are equal and briskly reactive to light. Funduscopic exam deferred.   Extraocular movements in vertical and horizontal planes were intact and without nystagmus. No Diplopia. Visual fields by finger perimetry are intact. Hearing was intact to soft voice and finger rubbing.  Facial sensation intact to fine touch. Facial motor strength is symmetric and tongue and uvula move midline.  Neck ROM : rotation, tilt and flexion extension were normal for age and shoulder shrug was symmetrical.    Motor exam:  Symmetric bulk, tone and ROM.   Normal tone without cog- wheeling, symmetric grip strength .left had grip strength is weaker, and thenar eminence is flattened.  Sensory:  Fine touch,and vibration were normal.  Proprioception tested in the upper extremities was normal.  Coordination: Rapid alternating movements in the fingers/hands were of normal speed.  The Finger-to-nose maneuver was intact without evidence of ataxia, dysmetria or tremor. Gait and station: Patient could rise unassisted from a seated position, walked without assistive device.  Stance is of very wide / base. Knee pain on the right knee, had  a fall, hip pain  Right.  Toe and heel walk were deferred.  Deep tendon reflexes: in the  upper and  lower extremities are symmetric and intact.  Babinski response was deferred .      After spending a total time of 45 minutes face to face and additional time for physical and neurologic examination, review of laboratory studies,  personal review of imaging studies, reports and results of other testing and review of referral information / records as far as provided in visit, I have established the following assessments:  1) I have the pleasure of seeing Mrs. Weber here today who has a history of struggling with obesity but no formal sleep evaluation in the past.  She is aware that she snores, she wakes up with a dry mouth, irritated sinuses, postnasal drip and is almost perpetually suffering from rhinitis and swollen nasal tissue.  She has an unusual large neck for a woman.   She also has a narrow upper airway and a rather small mouth.  The nasal bridge is very small to.  All these anatomical factors in combination with her BMI at sixty-three are placing her in a high risk category of having obstructive sleep apnea or obesity hypoventilation. She has begun to lose weight and is very happy about this. She has DM,  Is insulin resistent, has PCOS    I would like to invite her for a sleep study, Christella Scheuermann will likely only permit [place] a home sleep test.  However, this is enough to screen for apnea if we find evidence of hypoxemia, severe apnea or complex apnea he may be able to arrange for an inpatient study to titrate her.  So at this time I will order both studies PSG and home sleep test and hope that we will get an insurance authorization is in this month to not have to extend her work-up into the next calendar year.     My Plan is to proceed with:  1) HST and PSG ordered.    I would like to thank Minette Brine, [place] , [address] [place],  [place] [address] for allowing me to meet with and to take care of this pleasant patient.   In short, CLAUDEAN LEAVELLE is presenting with many risk factors  for OSA, Obesity hypoventilation.  CC: I will share my notes with PCP .  Electronically signed by: Larey Seat, MD 12/07/2019 1:20 PM  [facility] [place] certified by The [employer] of Sleep Medicine and Diplomate of the [employer] of Sleep Medicine. Board certified In Neurology through the [employer], Fellow of the [employer] of Neurology. Medical Director of [employer].

## 2019-12-07 NOTE — Patient Instructions (Signed)
Screening for Obesity Hypoventilation Syndrome  Obesity hypoventilation syndrome (OHS) means that you are not breathing well enough to get air in and out of your lungs efficiently (ventilation). This causes a low oxygen level and a high carbon dioxide level in your blood (hypoventilation). Having too much total body fat (obesity) is a significant risk factor for developing OHS. OHS makes it harder for your heart to pump oxygen-rich blood to your body. It can cause sleep disturbances and make you feel sleepy during the day. Over time, OHS can increase your risk for:  Heart disease.  High blood pressure (hypertension).  Reduced ability to absorb sugar from the bloodstream (insulin resistance).  Heart failure. Over time, OHS weakens your heart and can lead to heart failure. What are the causes? The exact cause of OHS is not known. Possible causes include:  Pressure on the lungs from excess body weight.  Obesity-related changes in how much air the lungs can hold (lung capacity) and how much they can expand (lung compliance).  Failure of the brain to regulate oxygen and carbon dioxide levels properly.  Chemicals (hormones) produced by excess fat cells interfering with breathing regulation.  A breathing condition in which breathing pauses or becomes shallow during sleep (sleep apnea). This condition can eventually cause the body to ventilate poorly and to hold onto carbon dioxide during the day. What increases the risk? You may have a greater risk for OHS if you:  Have a BMI of 30 or higher. BMI is an estimate of body fat that is calculated from height and weight. For adults, a BMI of 30 or higher is considered obese.  Are 34 years old.  Carry most of your excess weight around your waist.  Experience moderate symptoms of sleep apnea. What are the signs or symptoms? The most common symptoms of OHS are:  Daytime sleepiness.  Lack of energy.  Shortness of breath.  Morning  headaches.  Sleep apnea.  Trouble concentrating.  Irritability, mood swings, or depression.  Swollen veins in the neck.  Swelling of the legs. How is this diagnosed? Your health care provider may suspect OHS if you are obese and have poor breathing during the day and at night. Your health care provider will also do a physical exam. You may have tests to:  Measure your BMI.  Measure your blood oxygen level with a sensor placed on your finger (pulse oximetry).  Measure blood oxygen and carbon dioxide in a blood sample.  Measure the amount of red blood cells in a blood sample. OHS causes the number of red blood cells you have to increase (polycythemia).  Check your breathing ability (pulmonary function testing).  Check your breathing ability, breathing patterns, and oxygen level while you sleep (sleep study). You may also have a chest X-ray to rule out other breathing problems. You may have an electrocardiogram (ECG) and or echocardiogram to check for signs of heart failure. How is this treated? Weight loss is the most important part of treatment for OHS, and it may be the only treatment that you need. Other treatments may include:  Using a device to open your airway while you sleep, such as a continuous positive airway pressure (CPAP) machine that delivers oxygen to your airway through a mask.  Surgery (gastric bypass surgery) to lower your BMI. This may be needed if: ? You are very obese. ? Other treatments have not worked for you. ? Your OHS is very severe and is causing organ damage, such as heart failure.  Follow these instructions at home:  Medicines  Take over-the-counter and prescription medicines only as told by your health care provider.  Ask your health care provider what medicines are safe for you. You may be told to avoid medicines that can impair breathing and make OHS worse, such as sedatives and narcotics. Sleeping habits  If you are prescribed a CPAP machine,  make sure you understand and use the machine as directed.  Try to get 8 hours of sleep every night.  Go to bed at the same time every night, and get up at the same time every day. General instructions  Work with your health care provider to make a diet and exercise plan that helps you reach and maintain a healthy weight.  Eat a healthy diet.  Avoid smoking.  Exercise regularly as told by your health care provider.  During the evening, do not drink caffeine and do not eat heavy meals.  Keep all follow-up visits as told by your health care provider. This is important. Contact a health care provider if:  You experience new or worsening shortness of breath.  You have chest pain.  You have an irregular heartbeat (palpitations).  You have dizziness.  You faint.  You develop a cough.  You have a fever.  You have chest pain when you breathe (pleurisy). This information is not intended to replace advice given to you by your health care provider. Make sure you discuss any questions you have with your health care provider. Document Revised: 05/07/2018 Document Reviewed: 06/25/2015 Elsevier Patient Education  2020 [place] for Sleep Apnea  Sleep apnea is a condition in which breathing pauses or becomes shallow during sleep. Sleep apnea screening is a test to determine if you are at risk for sleep apnea. The test is easy and only takes a few minutes. Your health care provider may ask you to have this test in preparation for surgery or as part of a physical exam. What are the symptoms of sleep apnea? Common symptoms of sleep apnea include:  Snoring.  Restless sleep.  Daytime sleepiness.  Pauses in breathing.  Choking during sleep.  Irritability.  Forgetfulness.  Trouble thinking clearly.  Depression.  Personality changes. Most people with sleep apnea are not aware that they have it. Why should I get screened? Getting screened for sleep apnea can  help:  Ensure your safety. It is important for your health care providers to know whether or not you have sleep apnea, especially if you are having surgery or have other long-term (chronic) health conditions.  Improve your health and allow you to get a better night's rest. Restful sleep can help you: ? Have more energy. ? Lose weight. ? Improve high blood pressure. ? Improve diabetes management. ? Prevent stroke. ? Prevent car accidents. How is screening done? Screening usually includes being asked a list of questions about your sleep quality. Some questions you may be asked include:  Do you snore?  Is your sleep restless?  Do you have daytime sleepiness?  Has a partner or spouse told you that you stop breathing during sleep?  Have you had trouble concentrating or memory loss? If your screening test is positive, you are at risk for the condition. Further testing may be needed to confirm a diagnosis of sleep apnea. Where to find more information You can find screening tools online or at your health care clinic. For more information about sleep apnea screening and healthy sleep, visit these websites:  Centers for Disease  Control and Prevention: [employer]pl  American Sleep Apnea Association: www.sleepapnea.org Contact a health care provider if:  You think that you may have sleep apnea. Summary  Sleep apnea screening can help determine if you are at risk for sleep apnea.  It is important for your health care providers to know whether or not you have sleep apnea, especially if you are having surgery or have other chronic health conditions.  You may be asked to take a screening test for sleep apnea in preparation for surgery or as part of a physical exam. This information is not intended to replace advice given to you by your health care provider. Make sure you discuss any questions you have with your health care provider. Document Revised: 10/30/2017 Document  Reviewed: 04/25/2016 Elsevier Patient Education  [place].

## 2019-12-21 ENCOUNTER — Other Ambulatory Visit: Payer: Self-pay

## 2019-12-21 ENCOUNTER — Ambulatory Visit: Payer: Managed Care, Other (non HMO) | Admitting: Nurse Practitioner

## 2019-12-21 ENCOUNTER — Encounter: Payer: Self-pay | Admitting: Nurse Practitioner

## 2019-12-21 VITALS — BP 126/84 | HR 89 | Temp 98.0°F | Ht 67.0 in | Wt >= 6400 oz

## 2019-12-21 DIAGNOSIS — E119 Type 2 diabetes mellitus without complications: Secondary | ICD-10-CM | POA: Diagnosis not present

## 2019-12-21 DIAGNOSIS — M25561 Pain in right knee: Secondary | ICD-10-CM

## 2019-12-21 DIAGNOSIS — E662 Morbid (severe) obesity with alveolar hypoventilation: Secondary | ICD-10-CM

## 2019-12-21 DIAGNOSIS — Z6841 Body Mass Index (BMI) 40.0 and over, adult: Secondary | ICD-10-CM

## 2019-12-21 MED ORDER — DICLOFENAC SODIUM 1 % EX GEL: 2.0000 g | Freq: Four times a day (QID) | CUTANEOUS | 2 refills | Status: DC

## 2019-12-21 MED ORDER — OZEMPIC (0.25 OR 0.5 MG/DOSE) 2 MG/1.5ML ~~LOC~~ SOPN: 0.5000 mg | PEN_INJECTOR | SUBCUTANEOUS | 1 refills | Status: DC

## 2019-12-21 NOTE — Patient Instructions (Addendum)

## 2019-12-21 NOTE — Progress Notes (Signed)
Rutherford Nail as a scribe for Minette Brine, FNP.,have documented all relevant documentation on the behalf of Minette Brine, FNP,as directed by  Minette Brine, FNP while in the presence of Minette Brine, [place]. This visit occurred during the SARS-CoV-2 public health emergency.  Safety protocols were in place, including screening questions prior to the visit, additional usage of staff PPE, and extensive cleaning of exam room while observing appropriate contact time as indicated for disinfecting solutions.  Subjective:     Patient ID: Kristin Love , female    DOB: 1985/04/30 , 34 y.o.   MRN: [id number]   Chief Complaint  Patient presents with  . Diabetes  . Obesity  . Knee Pain    HPI  Pt here today for diabetes check, and knee pain   She has cut out her sodas and juices, cutting back on breads.  She is not exercising at this time. She has been trying to move at her desk and wears a sweat belt at home.  She is doing a sleep study at home next week. She has not had a headache since her last visit. Her menstrual cycles have been more October 25-Nov 7, back on Nov 12, just got off Nov 17th, then had again several days later after having intercourse she had a cycle for 2 days. She goes to [place] for an appt.   She is interested in weight loss. She is having problems with dyspnea when walking.  She reports she has gained approximately 150 lbs since having her last daughter.   Short term goal to lose 30-40 lbs in the next 3-4 months.   Diabetes She presents for her follow-up diabetic visit. She has type 2 diabetes mellitus. Pertinent negatives for hypoglycemia include no dizziness or headaches. Pertinent negatives for diabetes include no fatigue, no polydipsia, no polyphagia and no polyuria. There are no diabetic complications. Current diabetic treatment includes oral agent (monotherapy). She is compliant with treatment most of the time. She is following a generally unhealthy (she is  making changes) diet. She has not had a previous visit with a dietitian. She rarely participates in exercise. (She has not picked up her glucometer at this time, did not call to let us know.  ) Eye exam is not current.  Knee Pain  The incident occurred more than 1 week ago (8 weeks ago). Incident location: leg through chair went through the deck all weight went on right knee. The injury mechanism was a fall. Pain location: right knee. The quality of the pain is described as aching. Pertinent negatives include no inability to bear weight. Associated symptoms comments: Pain when walking up stairs. She has been to Urgent care and had an Xray which was normal. Worse over the last few days. .     Past Medical History:  Diagnosis Date  . Herpes   . Vaginal Pap smear, abnormal    had colposcopy repeat pap negative     Family History  Problem Relation Age of Onset  . Hypertension Mother   . Hypertension Father   . Diabetes Father   . Diabetes Maternal Grandmother   . Hypertension Maternal Grandmother   . Diabetes Maternal Grandfather   . Hypertension Maternal Grandfather   . Congestive Heart Failure Maternal Grandfather   . Diabetes Paternal Grandmother   . Hypertension Paternal Grandmother   . Diabetes Paternal Grandfather   . Hypertension Paternal Grandfather   . Stroke Paternal Grandfather      Current Outpatient Medications:  .  EPINEPHrine 0.3 mg/0.3 mL IJ SOAJ injection, Inject 0.3 mg into the muscle as needed for anaphylaxis., Disp: 1 each, Rfl: 2 .  metFORMIN (GLUCOPHAGE) 500 MG tablet, Take 1 tablet (500 mg total) by mouth 2 (two) times daily with a meal., Disp: 60 tablet, Rfl: 1 .  valACYclovir (VALTREX) 500 MG tablet, Take 1 tablet (500 mg total) by mouth daily. Can increase to twice a day for 5 days in the event of a recurrence, Disp: 30 tablet, Rfl: 12 .  blood glucose meter kit and supplies KIT, Dispense based on patient and insurance preference. Check blood sugar two times a  day and as needed (FOR ICD-9 250.00, 250.01). (Patient not taking: Reported on 12/21/2019), Disp: 1 each, Rfl: 0 .  diclofenac Sodium (VOLTAREN) 1 % GEL, Apply 2 g topically 4 (four) times daily., Disp: 100 g, Rfl: 2 .  Magnesium 250 MG TABS, Take 1 tablet (250 mg total) by mouth daily. Take with evening meal (Patient not taking: Reported on 12/21/2019), Disp: 30 tablet, Rfl: 0 .  Multiple Vitamins-Minerals (WOMENS MULTIVITAMIN PO), Take by mouth. (Patient not taking: Reported on 12/21/2019), Disp: , Rfl:  .  Semaglutide,0.25 or 0.5MG/DOS, (OZEMPIC, 0.25 OR 0.5 MG/DOSE,) 2 MG/1.5ML SOPN, Inject 0.5 mg into the skin once a week., Disp: 4.5 mL, Rfl: 1   Allergies  Allergen Reactions  . Shellfish Allergy Anaphylaxis  . Fish Allergy Swelling    Raw shrimp     Review of Systems  Constitutional: Negative.  Negative for fatigue.  HENT: Negative.   Respiratory: Negative.   Endocrine: Negative for polydipsia, polyphagia and polyuria.  Musculoskeletal: Negative.   Skin: Negative.   Neurological: Negative for dizziness and headaches.  Psychiatric/Behavioral: Negative.      Today's Vitals   12/21/19 1146  BP: 126/84  Pulse: 89  Temp: 98 F (36.7 C)  TempSrc: Oral  Weight: (!) 407 lb (184.6 kg)  Height: '5\' 7"'  (1.702 m)  PainSc: 6   PainLoc: Knee   Body mass index is 63.75 kg/m.   Objective:  Physical Exam Vitals reviewed.  Constitutional:      General: She is not in acute distress.    Appearance: Normal appearance. She is obese.  Cardiovascular:     Rate and Rhythm: Normal rate and regular rhythm.     Pulses: Normal pulses.     Heart sounds: Normal heart sounds. No murmur heard.   Pulmonary:     Effort: Pulmonary effort is normal. No respiratory distress.     Breath sounds: Normal breath sounds.  Skin:    Capillary Refill: Capillary refill takes less than 2 seconds.  Neurological:     General: No focal deficit present.     Mental Status: She is alert and oriented to  person, place, and time.     Cranial Nerves: No cranial nerve deficit.  Psychiatric:        Mood and Affect: Mood normal.        Behavior: Behavior normal.        Thought Content: Thought content normal.        Judgment: Judgment normal.         Assessment And Plan:     1. Obesity hypoventilation syndrome (HCC)  Chronic  Discussed healthy diet and regular exercise options   Encouraged to exercise at least 150 minutes per week with 2 days of strength training  2. Class 3 severe obesity with body mass index (BMI) of 63.75 to 69.9 in adult, unspecified obesity  type, unspecified whether serious comorbidity present ([place])  Chronic  Discussed healthy diet and regular exercise options   Encouraged to exercise at least 150 minutes per week with 2 days of strength training  She has lost 13 lbs since October  3. Acute pain of right knee  Tenderness with movement , negative drawer test  Will refer to orthopedics for further evaluation  I did discuss with her how weight can affect her knee pain - Ambulatory referral to Orthopedic Surgery - diclofenac Sodium (VOLTAREN) 1 % GEL; Apply 2 g topically 4 (four) times daily.  Dispense: 100 g; Refill: 2  4. Type 2 diabetes mellitus without complication, without long-term current use of insulin (HCC)  Chronic, stable  Continue with current medications  Encouraged to limit intake of sugary foods and drinks  Encouraged to increase physical activity to 150 minutes per week  Will start her on ozempic to get better control - Semaglutide,0.25 or 0.5MG/DOS, (OZEMPIC, 0.25 OR 0.5 MG/DOSE,) 2 MG/1.5ML SOPN; Inject 0.5 mg into the skin once a week.  Dispense: 4.5 mL; Refill: 1  Wt Readings from Last 3 Encounters:  12/21/19 (!) 407 lb (184.6 kg)  12/07/19 (!) 404 lb (183.3 kg)  11/09/19 (!) 417 lb 9.6 oz (189.4 kg)     Patient was given opportunity to ask questions. Patient verbalized understanding of the plan and was able to repeat key  elements of the plan. All questions were answered to their satisfaction.  Minette Brine, FNP   I, Minette Brine, FNP, have reviewed all documentation for this visit. The documentation on 12/25/19 for the exam, diagnosis, procedures, and orders are all accurate and complete.  THE PATIENT IS ENCOURAGED TO PRACTICE SOCIAL DISTANCING DUE TO THE COVID-19 PANDEMIC.

## 2020-01-02 ENCOUNTER — Ambulatory Visit (INDEPENDENT_AMBULATORY_CARE_PROVIDER_SITE_OTHER): Payer: Managed Care, Other (non HMO) | Admitting: Neurology

## 2020-01-02 DIAGNOSIS — E669 Obesity, unspecified: Secondary | ICD-10-CM

## 2020-01-02 DIAGNOSIS — J0130 Acute sphenoidal sinusitis, unspecified: Secondary | ICD-10-CM

## 2020-01-02 DIAGNOSIS — E282 Polycystic ovarian syndrome: Secondary | ICD-10-CM

## 2020-01-02 DIAGNOSIS — E662 Morbid (severe) obesity with alveolar hypoventilation: Secondary | ICD-10-CM

## 2020-01-02 DIAGNOSIS — G4733 Obstructive sleep apnea (adult) (pediatric): Secondary | ICD-10-CM | POA: Diagnosis not present

## 2020-01-02 DIAGNOSIS — G44019 Episodic cluster headache, not intractable: Secondary | ICD-10-CM

## 2020-01-04 ENCOUNTER — Other Ambulatory Visit: Payer: Self-pay

## 2020-01-04 ENCOUNTER — Other Ambulatory Visit: Payer: Self-pay | Admitting: Orthopedic Surgery

## 2020-01-04 DIAGNOSIS — M25561 Pain in right knee: Secondary | ICD-10-CM

## 2020-01-04 MED ORDER — BLOOD GLUCOSE MONITOR KIT
PACK | 0 refills | Status: DC
Start: 2020-01-04 — End: 2020-01-09

## 2020-01-05 NOTE — Progress Notes (Signed)
   [facility]  HOME SLEEP TEST (Watch PAT)  STUDY DATE: 01/02/20- load 01/05/20  DOB: 1985/05/20  MRN: [id number]  ORDERING CLINICIAN: Larey Seat, MD   REFERRING CLINICIAN: Minette Brine, FNP   CLINICAL INFORMATION/HISTORY: Kristin Love a 34 -year old African American female patientand [employer] employee ,who was seen here upon referralon 12/07/2019 from Minette Brine, [place]. Chiefconcern: " She has lost 13 pounds since nutrition visit with PCP, started on metformin, and PCOS had been diagnosed. Goal is to check for OSA in the setting of weight loss and wellness. She is a snorer and her family has witnessed apneas while she is asleep. She wakes often with headaches and a dry mouth. The patient has cluster headaches and morning headaches, too. " Kristin Love has a medical history of Herpes labialis, super obesity, is status post caesarian sections.   Epworth sleepiness score: 9/24. Fatigue Severity Score ( FSS ) a 54/63 points.   BMI: 63.28 kg/m  FINDINGS:   Total Record Time (hours, min): 8 h 3 min  Total Sleep Time (hours, min):  6 h 57 min   Percent REM (%): 25.45 %      Calculated pAHI (per hour):  37.4      REM pAHI:    72.1     NREM pAHI: 26.2   Oxygen Saturation (%) Mean:  92  Minimum oxygen saturation (%):         78   O2 Saturation Range (%): 78-99  O2Saturation (minutes) <=88%:  21. min  Pulse Mean (bpm):    90  Pulse Range (61-120)   IMPRESSION: This HST documented the presence of severe OSA (obstructive sleep apnea) at an AHI of 37.4/h and REM AHI of 72.1/h.  There were over 34 minutes of total oxygen desaturation time recorded, with a nadir at 78% SpO2. The RDI , which indicated snoring, was also very high.     RECOMMENDATION: Treatment for this severest OSA form with hypoxemia is PAP therapy, known as positive airway pressure therapy.  I recommend to start on CPAP at 6-18 cm water 3 cm EPR and use of a mask that takes  the nasal bridge and nasal congestion into account.    INTERPRETING PHYSICIAN:  Larey Seat, MD  [facility] Sleep Board certified by The [employer] of Sleep Medicine and Diplomate of the [employer] of Sleep Medicine. Board certified In Neurology through the [place], Fellow of the [employer] of Neurology. Medical Director of [employer].

## 2020-01-07 ENCOUNTER — Emergency Department (HOSPITAL_BASED_OUTPATIENT_CLINIC_OR_DEPARTMENT_OTHER)
Admission: EM | Admit: 2020-01-07 | Discharge: 2020-01-07 | Disposition: A | Discharge status: Home / Self Care | Payer: Managed Care, Other (non HMO) | Attending: Emergency Medicine | Admitting: Emergency Medicine

## 2020-01-07 ENCOUNTER — Encounter (HOSPITAL_BASED_OUTPATIENT_CLINIC_OR_DEPARTMENT_OTHER): Payer: Self-pay | Admitting: Emergency Medicine

## 2020-01-07 DIAGNOSIS — E119 Type 2 diabetes mellitus without complications: Secondary | ICD-10-CM | POA: Insufficient documentation

## 2020-01-07 DIAGNOSIS — Z113 Encounter for screening for infections with a predominantly sexual mode of transmission: Secondary | ICD-10-CM | POA: Insufficient documentation

## 2020-01-07 DIAGNOSIS — R1031 Right lower quadrant pain: Secondary | ICD-10-CM | POA: Diagnosis present

## 2020-01-07 DIAGNOSIS — N939 Abnormal uterine and vaginal bleeding, unspecified: Secondary | ICD-10-CM | POA: Insufficient documentation

## 2020-01-07 DIAGNOSIS — R1030 Lower abdominal pain, unspecified: Secondary | ICD-10-CM | POA: Diagnosis not present

## 2020-01-07 DIAGNOSIS — Z7984 Long term (current) use of oral hypoglycemic drugs: Secondary | ICD-10-CM | POA: Diagnosis not present

## 2020-01-07 HISTORY — DX: Type 2 diabetes mellitus without complications: E11.9

## 2020-01-07 HISTORY — DX: Polycystic ovarian syndrome: E28.2

## 2020-01-07 LAB — CBC WITH DIFFERENTIAL/PLATELET
Abs Immature Granulocytes: 0.05 10*3/uL (ref 0.00–0.07)
Basophils Absolute: 0 10*3/uL (ref 0.0–0.1)
Basophils Relative: 0 %
Eosinophils Absolute: 0.2 10*3/uL (ref 0.0–0.5)
Eosinophils Relative: 2 %
HCT: 41.9 % (ref 36.0–46.0)
Hemoglobin: 13.7 g/dL (ref 12.0–15.0)
Immature Granulocytes: 1 %
Lymphocytes Relative: 21 %
Lymphs Abs: 2.2 10*3/uL (ref 0.7–4.0)
MCH: 28.7 pg (ref 26.0–34.0)
MCHC: 32.7 g/dL (ref 30.0–36.0)
MCV: 87.7 fL (ref 80.0–100.0)
Monocytes Absolute: 0.6 10*3/uL (ref 0.1–1.0)
Monocytes Relative: 6 %
Neutro Abs: 7.3 10*3/uL (ref 1.7–7.7)
Neutrophils Relative %: 70 %
Platelets: 292 10*3/uL (ref 150–400)
RBC: 4.78 MIL/uL (ref 3.87–5.11)
RDW: 14.1 % (ref 11.5–15.5)
WBC: 10.5 10*3/uL (ref 4.0–10.5)
nRBC: 0 % (ref 0.0–0.2)

## 2020-01-07 LAB — BASIC METABOLIC PANEL
Anion gap: 10 (ref 5–15)
BUN: 9 mg/dL (ref 6–20)
CO2: 25 mmol/L (ref 22–32)
Calcium: 8.8 mg/dL — ABNORMAL LOW (ref 8.9–10.3)
Chloride: 103 mmol/L (ref 98–111)
Creatinine, Ser: 0.61 mg/dL (ref 0.44–1.00)
GFR, Estimated: >60 mL/min (ref >60)
Glucose, Bld: 114 mg/dL — ABNORMAL HIGH (ref 70–99)
Potassium: 3.7 mmol/L (ref 3.5–5.1)
Sodium: 138 mmol/L (ref 135–145)

## 2020-01-07 LAB — HIV ANTIBODY (ROUTINE TESTING W REFLEX): HIV Screen 4th Generation wRfx: NONREACTIVE

## 2020-01-07 LAB — PREGNANCY, URINE: Preg Test, Ur: NEGATIVE

## 2020-01-07 MED ORDER — MEGESTROL ACETATE 40 MG PO TABS: 40.0000 mg | ORAL_TABLET | Freq: Two times a day (BID) | ORAL | 0 refills | Status: DC

## 2020-01-07 NOTE — ED Triage Notes (Signed)
Vaginal bleeding since yesterday. States she has bled through 2 pads in the last hour.
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
## 2020-01-07 NOTE — Discharge Instructions (Signed)
Start taking the Megace to help control the bleeding.  I spoke with Dr. Elly Modena, OB/GYN, you can increase to 80 mg twice daily if not improving in a few days.  Follow up with your [facility] GYN doctor as we discussed

## 2020-01-08 LAB — RPR: RPR Ser Ql: NONREACTIVE

## 2020-01-09 ENCOUNTER — Other Ambulatory Visit: Payer: Self-pay

## 2020-01-09 ENCOUNTER — Ambulatory Visit (INDEPENDENT_AMBULATORY_CARE_PROVIDER_SITE_OTHER): Payer: Managed Care, Other (non HMO) | Admitting: Obstetrics and Gynecology

## 2020-01-09 ENCOUNTER — Encounter: Payer: Self-pay | Admitting: Obstetrics and Gynecology

## 2020-01-09 VITALS — BP 118/77 | HR 92 | Wt >= 6400 oz

## 2020-01-09 DIAGNOSIS — N939 Abnormal uterine and vaginal bleeding, unspecified: Secondary | ICD-10-CM

## 2020-01-09 LAB — GC/CHLAMYDIA PROBE AMP (~~LOC~~) NOT AT ARMC
Chlamydia: NEGATIVE
Comment: NEGATIVE
Comment: NORMAL
Neisseria Gonorrhea: NEGATIVE

## 2020-01-09 MED ORDER — BLOOD GLUCOSE MONITOR KIT: PACK | 0 refills | Status: AC

## 2020-01-09 MED ORDER — MEGESTROL ACETATE 40 MG PO TABS: 40.0000 mg | ORAL_TABLET | Freq: Two times a day (BID) | ORAL | 6 refills | Status: DC

## 2020-01-09 NOTE — Progress Notes (Signed)
Pt seen at [facility] over weekend due to increase in bleeding. Pt was given Megace and states it has helped a little bit.  Pt is having increase in cramping and HA's.

## 2020-01-09 NOTE — Progress Notes (Signed)
34 yo P2 presenting as an ED follow up for the continued management of her irregular menses. Patient often skips periods and the following period is heavy with clots. Patient never started previously prescribed birth control pill. She is in the process of losing weight and was recently started on medication to control her diabetes. Patient is considering the gastric sleeve  Past Medical History:  Diagnosis Date  . Diabetes mellitus without complication ([place])   . Herpes   . PCOS (polycystic ovarian syndrome)   . Vaginal Pap smear, abnormal    had colposcopy repeat pap negative   Past Surgical History:  Procedure Laterality Date  . CESAREAN SECTION    . CESAREAN SECTION N/A 07/24/2015   Procedure: CESAREAN SECTION;  Surgeon: Donnamae Jude, MD;  Location: [facility];  Service: Obstetrics;  Laterality: N/A;   Family History  Problem Relation Age of Onset  . Hypertension Mother   . Hypertension Father   . Diabetes Father   . Diabetes Maternal Grandmother   . Hypertension Maternal Grandmother   . Diabetes Maternal Grandfather   . Hypertension Maternal Grandfather   . Congestive Heart Failure Maternal Grandfather   . Diabetes Paternal Grandmother   . Hypertension Paternal Grandmother   . Diabetes Paternal Grandfather   . Hypertension Paternal Grandfather   . Stroke Paternal Grandfather    Social History   Tobacco Use  . Smoking status: Never Smoker  . Smokeless tobacco: Never Used  Vaping Use  . Vaping Use: Never used  Substance Use Topics  . Alcohol use: No  . Drug use: No   ROS See pertinent in HPI. All other systems reviewed and non contributory Blood pressure 118/77, pulse 92, weight (!) 406 lb (184.2 kg), last menstrual period 12/30/2019.  GENERAL: Well-developed, well-nourished female in no acute distress.  NEURO: alert and oriented x 3  A/P 34 yo with irregular cycles due to PCOS - Discussed contraception again- patient is not interested in contraception at  this time - Discussed cyclical use of megace to control flow of menses while she continues her weight loss efforts - RTC prn

## 2020-01-12 DIAGNOSIS — G4733 Obstructive sleep apnea (adult) (pediatric): Secondary | ICD-10-CM | POA: Insufficient documentation

## 2020-01-12 NOTE — Progress Notes (Signed)
IMPRESSION: This HST documented the presence of severe OSA (obstructive sleep apnea) at an AHI of 37.4/h and REM AHI of 72.1/h.  There were over 34 minutes of total oxygen desaturation time recorded, with a nadir at 78% SpO2. The RDI , which indicated snoring, was also very high.     RECOMMENDATION: Treatment for this severest OSA form with hypoxemia is PAP therapy, known as positive airway pressure therapy. The patient suffers very likely from obesity hypoventilation syndrome.  I recommend to start on CPAP at 6-18 cm water 3 cm EPR and use of a mask that takes the nasal bridge and nasal congestion into account. If the pateint cannot tolerate CPAP she is to inform DME immediately to troubleshot and we may have to do a CPAP/ BiPAP study in lab.

## 2020-01-12 NOTE — Addendum Note (Signed)
Addended by: Larey Seat on: 01/12/2020 05:00 PM   Modules accepted: Orders

## 2020-01-12 NOTE — Procedures (Signed)
[facility]  HOME SLEEP TEST (Watch PAT)  STUDY DATE: 01/02/20- load 01/05/20  DOB: [birth year]  MRN: [id number]  ORDERING CLINICIAN: Larey Seat, MD   REFERRING CLINICIAN: Minette Brine, FNP   CLINICAL INFORMATION/HISTORY: Kristin Love a 34 -year old African American female patientand [employer] employee ,who was seen here upon referralon 12/07/2019 from Minette Brine, [place]. Chiefconcern: " She has lost 13 pounds since nutrition visit with PCP, started on metformin, and PCOS had been diagnosed. Goal is to check for OSA in the setting of weight loss and wellness. She is a snorer and her family has witnessed apneas while she is asleep. She wakes often with headaches and a dry mouth. The patient has cluster headaches and morning headaches, too. " [name] has a medical history of Herpes labialis, super obesity, is status post caesarian sections.   Epworth sleepiness score: 9/24. Fatigue Severity Score ( FSS ) a 54/63 points.   BMI: 63.28 kg/m  FINDINGS:   Total Record Time (hours, min): 8 h 3 min  Total Sleep Time (hours, min):  6 h 57 min   Percent REM (%): 25.45 %      Calculated pAHI (per hour):  37.4      REM pAHI:    72.1     NREM pAHI: 26.2   Oxygen Saturation (%) Mean:  92  Minimum oxygen saturation (%):         78   O2 Saturation Range (%): 78-99  O2Saturation (minutes) <=88%:  21. min  Pulse Mean (bpm):    90  Pulse Range (61-120)   IMPRESSION: This HST documented the presence of severe OSA (obstructive sleep apnea) at an AHI of 37.4/h and REM AHI of 72.1/h.  There were over 34 minutes of total oxygen desaturation time recorded, with a nadir at 78% SpO2. The RDI , which indicated snoring, was also very high.     RECOMMENDATION: Treatment for this severest OSA form with hypoxemia is PAP therapy, known as positive airway pressure therapy. The patient suffers very likely from obesity hypoventilation syndrome.  I recommend to  start on CPAP at 6-18 cm water 3 cm EPR and use of a mask that takes the nasal bridge and nasal congestion into account. If the pateint cannot tolerate CPAP she is to inform DME immediately to troubleshot and we may have to do a CPAP/ BiPAP study in lab.     INTERPRETING PHYSICIAN:  Larey Seat, MD  [facility] Sleep Board certified by The [employer] of Sleep Medicine and Diplomate of the [employer] of Sleep Medicine. Board certified In Neurology through the [place], Fellow of the [employer] of Neurology. Medical Director of [employer].

## 2020-01-15 ENCOUNTER — Ambulatory Visit
Admission: RE | Admit: 2020-01-15 | Discharge: 2020-01-15 | Disposition: A | Discharge status: Home / Self Care | Payer: Managed Care, Other (non HMO) | Source: Ambulatory Visit | Attending: Orthopedic Surgery | Admitting: Orthopedic Surgery

## 2020-01-15 ENCOUNTER — Other Ambulatory Visit: Payer: Self-pay

## 2020-01-15 DIAGNOSIS — M25561 Pain in right knee: Secondary | ICD-10-CM

## 2020-01-16 ENCOUNTER — Telehealth: Payer: Self-pay | Admitting: Neurology

## 2020-01-16 NOTE — Telephone Encounter (Signed)
I called pt. I advised pt that Dr. Brett Fairy reviewed their sleep study results and found that pt has severe sleep apnea. Dr. Brett Fairy recommends that pt starts auto CPAP. I reviewed PAP compliance expectations with the pt. Pt is agreeable to starting a CPAP. I advised pt that an order will be sent to a DME, [facility] and [facility] will call the pt within about one week after they file with the pt's insurance. [facility]) will show the pt how to use the machine, fit for masks, and troubleshoot the CPAP if needed. A follow up appt will need to be made for insurance purposes with Dr. Brett Fairy or the NP. Pt verbalized understanding to call the office to schedule the f/u apt visit 31-90 days from date she gets the machine. A letter with all of this information in it will be sent to the pt as a reminder. Pt verbalized understanding of results. Pt had no questions at this time but was encouraged to call back if questions arise. I have sent the order to [facility]) and have received confirmation that they have received the order.

## 2020-01-16 NOTE — Telephone Encounter (Signed)
-----   Message from Larey Seat, MD sent at 01/12/2020  4:59 PM EST ----- IMPRESSION: This HST documented the presence of severe OSA (obstructive sleep apnea) at an AHI of 37.4/h and REM AHI of 72.1/h.  There were over 34 minutes of total oxygen desaturation time recorded, with a nadir at 78% SpO2. The RDI , which indicated snoring, was also very high.     RECOMMENDATION: Treatment for this severest OSA form with hypoxemia is PAP therapy, known as positive airway pressure therapy. The patient suffers very likely from obesity hypoventilation syndrome.  I recommend to start on CPAP at 6-18 cm water 3 cm EPR and use of a mask that takes the nasal bridge and nasal congestion into account. If the pateint cannot tolerate CPAP she is to inform DME immediately to troubleshot and we may have to do a CPAP/ BiPAP study in lab.

## 2020-01-26 ENCOUNTER — Other Ambulatory Visit: Payer: 59

## 2020-01-28 ENCOUNTER — Other Ambulatory Visit: Payer: Self-pay | Admitting: Nurse Practitioner

## 2020-02-01 ENCOUNTER — Encounter: Payer: Self-pay | Admitting: Nurse Practitioner

## 2020-02-01 ENCOUNTER — Other Ambulatory Visit: Payer: Self-pay

## 2020-02-01 ENCOUNTER — Ambulatory Visit (INDEPENDENT_AMBULATORY_CARE_PROVIDER_SITE_OTHER): Payer: Managed Care, Other (non HMO) | Admitting: Nurse Practitioner

## 2020-02-01 VITALS — BP 118/80 | HR 107 | Temp 98.3°F | Ht 68.8 in | Wt 399.6 lb

## 2020-02-01 DIAGNOSIS — E119 Type 2 diabetes mellitus without complications: Secondary | ICD-10-CM | POA: Diagnosis not present

## 2020-02-01 DIAGNOSIS — Z6841 Body Mass Index (BMI) 40.0 and over, adult: Secondary | ICD-10-CM

## 2020-02-01 DIAGNOSIS — S83281D Other tear of lateral meniscus, current injury, right knee, subsequent encounter: Secondary | ICD-10-CM | POA: Diagnosis not present

## 2020-02-01 DIAGNOSIS — E8881 Metabolic syndrome: Secondary | ICD-10-CM

## 2020-02-01 LAB — POCT UA - MICROALBUMIN
Albumin/Creatinine Ratio, Urine, POC: 300
Creatinine, POC: 300 mg/dL
Microalbumin Ur, POC: 150 mg/L

## 2020-02-01 NOTE — Progress Notes (Signed)
I,Yamilka Roman [employer] as a Education administrator for [employer], FNP.,have documented all relevant documentation on the behalf of Minette Brine, FNP,as directed by  Minette Brine, FNP while in the presence of Minette Brine, [place]. This visit occurred during the SARS-CoV-2 public health emergency.  Safety protocols were in place, including screening questions prior to the visit, additional usage of staff PPE, and extensive cleaning of exam room while observing appropriate contact time as indicated for disinfecting solutions.  Subjective:     Patient ID: Kristin Love , female    DOB: 27-Jun-1985 , 35 y.o.   MRN: [id number]   Chief Complaint  Patient presents with  . Diabetes    HPI  Patient here for a f/u on her diabetes.  She is taking Ozempic 0.5 mg, she had missed a few doses but is now back on track.  She has started her diet back.  She can not tell the difference with her diet.  She does admit to eating bad during the Christmas Holiday.  She has stopped drinking sodas occasionally will have diet soda.  She has noticed when she goes to the bathroom has brown curdlike discharge.   Wt Readings from Last 3 Encounters: 02/01/20 : (!) 399 lb 9.6 oz (181.3 kg) 01/09/20 : (!) 406 lb (184.2 kg) 01/07/20 : (!) 407 lb (184.6 kg)  Diabetes She presents for her follow-up diabetic visit. She has type 2 diabetes mellitus. Her disease course has been stable. There are no hypoglycemic associated symptoms. Pertinent negatives for hypoglycemia include no dizziness or headaches. Pertinent negatives for diabetes include no chest pain, no polydipsia, no polyphagia and no polyuria. There are no hypoglycemic complications. There are no diabetic complications.     Past Medical History:  Diagnosis Date  . Diabetes mellitus without complication ([place])   . Herpes   . PCOS (polycystic ovarian syndrome)   . Vaginal Pap smear, abnormal    had colposcopy repeat pap negative     Family History  Problem Relation Age  of Onset  . Hypertension Mother   . Hypertension Father   . Diabetes Father   . Diabetes Maternal Grandmother   . Hypertension Maternal Grandmother   . Diabetes Maternal Grandfather   . Hypertension Maternal Grandfather   . Congestive Heart Failure Maternal Grandfather   . Diabetes Paternal Grandmother   . Hypertension Paternal Grandmother   . Diabetes Paternal Grandfather   . Hypertension Paternal Grandfather   . Stroke Paternal Grandfather      Current Outpatient Medications:  .  diclofenac Sodium (VOLTAREN) 1 % GEL, Apply 2 g topically 4 (four) times daily., Disp: 100 g, Rfl: 2 .  EPINEPHrine 0.3 mg/0.3 mL IJ SOAJ injection, Inject 0.3 mg into the muscle as needed for anaphylaxis., Disp: 1 each, Rfl: 2 .  metFORMIN (GLUCOPHAGE) 500 MG tablet, TAKE 1 TABLET BY MOUTH 2 TIMES DAILY WITH A MEAL., Disp: 60 tablet, Rfl: 1 .  Semaglutide,0.25 or 0.5MG/DOS, (OZEMPIC, 0.25 OR 0.5 MG/DOSE,) 2 MG/1.5ML SOPN, Inject 0.5 mg into the skin once a week., Disp: 4.5 mL, Rfl: 1 .  valACYclovir (VALTREX) 500 MG tablet, Take 1 tablet (500 mg total) by mouth daily. Can increase to twice a day for 5 days in the event of a recurrence, Disp: 30 tablet, Rfl: 12 .  blood glucose meter kit and supplies KIT, Dispense based on patient and insurance preference. Check blood sugar two times a day and as needed (FOR ICD-9 250.00, 250.01). (Patient not taking: Reported on 02/01/2020), Disp:  1 each, Rfl: 0 .  metroNIDAZOLE (FLAGYL) 500 MG tablet, Take 1 tablet (500 mg total) by mouth 2 (two) times daily., Disp: 14 tablet, Rfl: 0 .  Multiple Vitamins-Minerals (WOMENS MULTIVITAMIN PO), Take by mouth. (Patient not taking: No sig reported), Disp: , Rfl:  .  tranexamic acid (LYSTEDA) 650 MG TABS tablet, Take 2 tablets (1,300 mg total) by mouth 3 (three) times daily. Take during menses for a maximum of five days, Disp: 30 tablet, Rfl: 2   Allergies  Allergen Reactions  . Shellfish Allergy Anaphylaxis  . Fish Allergy Swelling     Raw shrimp     Review of Systems  Constitutional: Negative.   HENT: Negative.   Eyes: Negative.   Respiratory: Negative.   Cardiovascular: Negative.  Negative for chest pain, palpitations and leg swelling.  Gastrointestinal: Negative.   Endocrine: Negative for polydipsia, polyphagia and polyuria.  Genitourinary: Negative.   Musculoskeletal: Negative.   Skin: Negative.   Neurological: Negative.  Negative for dizziness and headaches.  Hematological: Negative.   Psychiatric/Behavioral: Negative.      Today's Vitals   02/01/20 1140  BP: 118/80  Pulse: (!) 107  Temp: 98.3 F (36.8 C)  TempSrc: Oral  Weight: (!) 399 lb 9.6 oz (181.3 kg)  Height: 5' 8.8" (1.748 m)  PainSc: 0-No pain   Body mass index is 59.35 kg/m.   Objective:  Physical Exam Constitutional:      Appearance: Normal appearance. She is obese.  Cardiovascular:     Rate and Rhythm: Normal rate.     Pulses: Normal pulses.     Heart sounds: Normal heart sounds.  Pulmonary:     Effort: Pulmonary effort is normal.     Breath sounds: Normal breath sounds.  Abdominal:     General: Abdomen is flat.     Palpations: Abdomen is soft.  Musculoskeletal:        General: Normal range of motion.     Cervical back: Normal range of motion and neck supple.  Skin:    General: Skin is warm and dry.     Capillary Refill: Capillary refill takes less than 2 seconds.  Neurological:     General: No focal deficit present.     Mental Status: She is alert and oriented to person, place, and time.  Psychiatric:        Mood and Affect: Mood normal.        Behavior: Behavior normal.        Thought Content: Thought content normal.        Judgment: Judgment normal.         Assessment And Plan:     1. Type 2 diabetes mellitus without complication, without long-term current use of insulin (HCC)  Chronic, continue with current medications  Tolerating medications well - Hemoglobin A1c - BMP8+eGFR - POCT UA -  Microalbumin  2. Class 3 severe obesity due to excess calories without serious comorbidity with body mass index (BMI) of 50.0 to 59.9 in adult [facility]) She has lost approximately 6 lbs since starting Ozempic Continue with focusing on healthy diet and exercise as tolerated  3. Other tear of lateral meniscus of right knee as current injury, subsequent encounter Being followed by [facility]  4. Insulin resistance  Ozempic tolerating well  Continue to eat a low carbohydrate diet - Insulin, random - Hemoglobin A1c - BMP8+eGFR     Patient was given opportunity to ask questions. Patient verbalized understanding of the plan and was able to  repeat key elements of the plan. All questions were answered to their satisfaction.  Minette Brine, FNP   I, Minette Brine, FNP, have reviewed all documentation for this visit. The documentation on 02/01/20 for the exam, diagnosis, procedures, and orders are all accurate and complete.  THE PATIENT IS ENCOURAGED TO PRACTICE SOCIAL DISTANCING DUE TO THE COVID-19 PANDEMIC.

## 2020-02-02 LAB — BMP8+EGFR
BUN/Creatinine Ratio: 12 (ref 9–23)
BUN: 8 mg/dL (ref 6–20)
CO2: 22 mmol/L (ref 20–29)
Calcium: 8.9 mg/dL (ref 8.7–10.2)
Chloride: 104 mmol/L (ref 96–106)
Creatinine, Ser: 0.66 mg/dL (ref 0.57–1.00)
GFR calc Af Amer: 134 mL/min/{1.73_m2} (ref >59)
GFR calc non Af Amer: 116 mL/min/{1.73_m2} (ref >59)
Glucose: 84 mg/dL (ref 65–99)
Potassium: 4.3 mmol/L (ref 3.5–5.2)
Sodium: 139 mmol/L (ref 134–144)

## 2020-02-02 LAB — HEMOGLOBIN A1C
Est. average glucose Bld gHb Est-mCnc: 131 mg/dL
Hgb A1c MFr Bld: 6.2 % — ABNORMAL HIGH (ref 4.8–5.6)

## 2020-02-02 LAB — INSULIN, RANDOM: INSULIN: 26.4 u[IU]/mL — ABNORMAL HIGH (ref 2.6–24.9)

## 2020-02-03 ENCOUNTER — Telehealth: Payer: Self-pay

## 2020-02-03 MED ORDER — METRONIDAZOLE 500 MG PO TABS: 500.0000 mg | ORAL_TABLET | Freq: Two times a day (BID) | ORAL | 0 refills | Status: DC

## 2020-02-03 NOTE — Telephone Encounter (Signed)
Patient thinks she has BV and would like something called into her pharmacy. Advised patient she must be seen for additional refills can be sent.

## 2020-02-20 ENCOUNTER — Encounter: Payer: Self-pay | Admitting: Nurse Practitioner

## 2020-03-12 ENCOUNTER — Other Ambulatory Visit: Payer: Self-pay

## 2020-03-12 ENCOUNTER — Encounter: Payer: Self-pay | Admitting: Obstetrics and Gynecology

## 2020-03-12 ENCOUNTER — Ambulatory Visit (INDEPENDENT_AMBULATORY_CARE_PROVIDER_SITE_OTHER): Payer: Managed Care, Other (non HMO) | Admitting: Obstetrics and Gynecology

## 2020-03-12 VITALS — BP 130/77 | HR 96 | Wt 393.0 lb

## 2020-03-12 DIAGNOSIS — N939 Abnormal uterine and vaginal bleeding, unspecified: Secondary | ICD-10-CM | POA: Diagnosis not present

## 2020-03-12 MED ORDER — TRANEXAMIC ACID 650 MG PO TABS
1300.0000 mg | ORAL_TABLET | Freq: Three times a day (TID) | ORAL | 2 refills | Status: DC
Start: 2020-03-12 — End: 2020-04-17

## 2020-03-12 MED ORDER — METRONIDAZOLE 500 MG PO TABS: 500.0000 mg | ORAL_TABLET | Freq: Two times a day (BID) | ORAL | 0 refills | Status: DC

## 2020-03-12 NOTE — Progress Notes (Signed)
35 yo P2 here to discuss AUB. Patient reports persistent episodes of irregular spotting. Patient was previously seen for this problem and counseled on contraception options. Patient is currently on a weight loss journey and does not want to add hormones to her regimen which may stall her weigh loss progression. Patient is without any other complaints. She describes her bleeding as daily intermittent spotting which can last for 2-3 weeks. She reports recurrent BV infections as a result  Past Medical History:  Diagnosis Date  . Diabetes mellitus without complication ([place])   . Herpes   . PCOS (polycystic ovarian syndrome)   . Vaginal Pap smear, abnormal    had colposcopy repeat pap negative   Past Surgical History:  Procedure Laterality Date  . CESAREAN SECTION    . CESAREAN SECTION N/A 07/24/2015   Procedure: CESAREAN SECTION;  Surgeon: Donnamae Jude, MD;  Location: [facility];  Service: Obstetrics;  Laterality: N/A;   Family History  Problem Relation Age of Onset  . Hypertension Mother   . Hypertension Father   . Diabetes Father   . Diabetes Maternal Grandmother   . Hypertension Maternal Grandmother   . Diabetes Maternal Grandfather   . Hypertension Maternal Grandfather   . Congestive Heart Failure Maternal Grandfather   . Diabetes Paternal Grandmother   . Hypertension Paternal Grandmother   . Diabetes Paternal Grandfather   . Hypertension Paternal Grandfather   . Stroke Paternal Grandfather    Social History   Tobacco Use  . Smoking status: Never Smoker  . Smokeless tobacco: Never Used  Vaping Use  . Vaping Use: Never used  Substance Use Topics  . Alcohol use: No  . Drug use: No   ROS See pertinent in HPI. All other systems reviewed and non contributory  Blood pressure 130/77, pulse 96, weight (!) 393 lb (178.3 kg), last menstrual period 01/07/2020. GENERAL: Well-developed, well-nourished female in no acute distress.  NEURO: alert and oriented x 3  A/P 35 yo  with AUB - Patient desires to conceive one more time - Discussed limited management option. Discussed a 48-month trial of Lysteda - repeat pelvic ultrasound ordered - patient will be contacted with results of ultrasound

## 2020-03-12 NOTE — Progress Notes (Signed)
Pt states changes in bleeding since December.  Pt states she has some issues with or around intercourse.   Pt is recently started a weight loss journey and is concerned that if she starts BC to help it will hinder her weight loss.

## 2020-03-13 ENCOUNTER — Encounter: Payer: Self-pay | Admitting: Nurse Practitioner

## 2020-03-23 ENCOUNTER — Ambulatory Visit
Admission: RE | Admit: 2020-03-23 | Discharge: 2020-03-23 | Disposition: A | Discharge status: Home / Self Care | Payer: Managed Care, Other (non HMO) | Source: Ambulatory Visit | Attending: Obstetrics and Gynecology | Admitting: Obstetrics and Gynecology

## 2020-03-23 ENCOUNTER — Other Ambulatory Visit: Payer: Self-pay

## 2020-03-23 DIAGNOSIS — N939 Abnormal uterine and vaginal bleeding, unspecified: Secondary | ICD-10-CM

## 2020-03-27 ENCOUNTER — Telehealth: Payer: Self-pay

## 2020-03-27 NOTE — Telephone Encounter (Signed)
Patient states that she started Lysteda on 2/16 and completed it on the 2/20. She states that she started the ocp 2/21 because the Lysteda did not help her bleeding. Patient states that she wanted try the ocp to see if that would help her bleeding even with her reservations on weight gain. She has continued to bleed even with taking the ocp. She states that she has stopped bleeding today, and that she is going to continue to take the ocp. Patient states that she had a prescription that was prescribed a while ago that she started taking. She is taking Balcoltra, and states that she needs a new rx because the pills that she has are about to expire. The rx was prescribe 10/2019.

## 2020-03-28 NOTE — Telephone Encounter (Signed)
Patient informed. She also wanted to discuss u/s results. Patient advised to make an appt to discuss those results. She was agreeable to that, and stated that she will call back and make the appt due to being at work.

## 2020-04-04 ENCOUNTER — Telehealth: Payer: Self-pay

## 2020-04-04 NOTE — Telephone Encounter (Signed)
Patient called stating she has had a menstrual cycle for 4 weeks. She stated they have tried her on 2 different meds but it didn't stop her cycle. She said as soon as she stopped the metformin she stopped bleeding. She stated she is still doing the ozempic but she keeps missing her dose.   I returned her call and advised her that if she doesn't feel comfortable taking the metformin she can go ahead and stop taking it. I advised her to make sure she takes the ozempic weekly. She said she is going to take it today I advised her that Marlana Latus would be her new day to take the medication. YL,RMA

## 2020-04-11 NOTE — Telephone Encounter (Signed)
Received a email from [facility]) stating they have attempted to contact the patient with no response. Pt will have to contact their office to get set up.   "I have been trying to get [name] in and scheduled for her CPAP machine that was ordered by Dr. Brett Fairy. I have tried contacting her by phone three times now, as well as by email. We have also left three separate voicemails- all with our callback number attached. Due to the exhausted number of attempts to get her in and scheduled, we are voiding this order and passing her machine off to the next awaiting patient. If Kristin Love does call back; we will reinstate her order and get her in as soon as we can."

## 2020-04-17 ENCOUNTER — Ambulatory Visit (INDEPENDENT_AMBULATORY_CARE_PROVIDER_SITE_OTHER): Payer: Managed Care, Other (non HMO)

## 2020-04-17 ENCOUNTER — Other Ambulatory Visit: Payer: Self-pay

## 2020-04-17 ENCOUNTER — Emergency Department (HOSPITAL_BASED_OUTPATIENT_CLINIC_OR_DEPARTMENT_OTHER)
Admission: EM | Admit: 2020-04-17 | Discharge: 2020-04-17 | Disposition: A | Discharge status: Home / Self Care | Payer: Managed Care, Other (non HMO) | Attending: Emergency Medicine | Admitting: Emergency Medicine

## 2020-04-17 ENCOUNTER — Other Ambulatory Visit (HOSPITAL_COMMUNITY)
Admission: RE | Admit: 2020-04-17 | Discharge: 2020-04-17 | Disposition: A | Discharge status: Home / Self Care | Payer: Managed Care, Other (non HMO) | Source: Ambulatory Visit | Attending: Obstetrics | Admitting: Obstetrics

## 2020-04-17 ENCOUNTER — Emergency Department (HOSPITAL_BASED_OUTPATIENT_CLINIC_OR_DEPARTMENT_OTHER): Payer: Managed Care, Other (non HMO)

## 2020-04-17 VITALS — BP 120/83 | HR 93 | Ht 68.0 in | Wt 382.0 lb

## 2020-04-17 DIAGNOSIS — S90212A Contusion of left great toe with damage to nail, initial encounter: Secondary | ICD-10-CM | POA: Diagnosis not present

## 2020-04-17 DIAGNOSIS — Z7984 Long term (current) use of oral hypoglycemic drugs: Secondary | ICD-10-CM | POA: Diagnosis not present

## 2020-04-17 DIAGNOSIS — L03032 Cellulitis of left toe: Secondary | ICD-10-CM | POA: Diagnosis not present

## 2020-04-17 DIAGNOSIS — R079 Chest pain, unspecified: Secondary | ICD-10-CM | POA: Insufficient documentation

## 2020-04-17 DIAGNOSIS — Z79899 Other long term (current) drug therapy: Secondary | ICD-10-CM | POA: Diagnosis not present

## 2020-04-17 DIAGNOSIS — S99922A Unspecified injury of left foot, initial encounter: Secondary | ICD-10-CM | POA: Diagnosis present

## 2020-04-17 DIAGNOSIS — X58XXXA Exposure to other specified factors, initial encounter: Secondary | ICD-10-CM | POA: Diagnosis not present

## 2020-04-17 DIAGNOSIS — N898 Other specified noninflammatory disorders of vagina: Secondary | ICD-10-CM | POA: Insufficient documentation

## 2020-04-17 DIAGNOSIS — E119 Type 2 diabetes mellitus without complications: Secondary | ICD-10-CM | POA: Insufficient documentation

## 2020-04-17 DIAGNOSIS — M940 Chondrocostal junction syndrome [Tietze]: Secondary | ICD-10-CM

## 2020-04-17 LAB — BASIC METABOLIC PANEL
Anion gap: 9 (ref 5–15)
BUN: 7 mg/dL (ref 6–20)
CO2: 24 mmol/L (ref 22–32)
Calcium: 9.3 mg/dL (ref 8.9–10.3)
Chloride: 103 mmol/L (ref 98–111)
Creatinine, Ser: 0.56 mg/dL (ref 0.44–1.00)
GFR, Estimated: >60 mL/min (ref >60)
Glucose, Bld: 105 mg/dL — ABNORMAL HIGH (ref 70–99)
Potassium: 3.8 mmol/L (ref 3.5–5.1)
Sodium: 136 mmol/L (ref 135–145)

## 2020-04-17 LAB — HEPATIC FUNCTION PANEL
ALT: 9 U/L (ref 0–44)
AST: 14 U/L — ABNORMAL LOW (ref 15–41)
Albumin: 3.4 g/dL — ABNORMAL LOW (ref 3.5–5.0)
Alkaline Phosphatase: 52 U/L (ref 38–126)
Bilirubin, Direct: <0.1 mg/dL (ref 0.0–0.2)
Total Bilirubin: 0.5 mg/dL (ref 0.3–1.2)
Total Protein: 7.7 g/dL (ref 6.5–8.1)

## 2020-04-17 LAB — CBC
HCT: 38.8 % (ref 36.0–46.0)
Hemoglobin: 12.9 g/dL (ref 12.0–15.0)
MCH: 28.6 pg (ref 26.0–34.0)
MCHC: 33.2 g/dL (ref 30.0–36.0)
MCV: 86 fL (ref 80.0–100.0)
Platelets: 339 10*3/uL (ref 150–400)
RBC: 4.51 MIL/uL (ref 3.87–5.11)
RDW: 14.1 % (ref 11.5–15.5)
WBC: 10.6 10*3/uL — ABNORMAL HIGH (ref 4.0–10.5)
nRBC: 0 % (ref 0.0–0.2)

## 2020-04-17 LAB — LIPASE, BLOOD: Lipase: 25 U/L (ref 11–51)

## 2020-04-17 LAB — TROPONIN I (HIGH SENSITIVITY)
Troponin I (High Sensitivity): <2 ng/L (ref <18)
Troponin I (High Sensitivity): <2 ng/L (ref <18)

## 2020-04-17 LAB — URINALYSIS, ROUTINE W REFLEX MICROSCOPIC
Bilirubin Urine: NEGATIVE
Glucose, UA: NEGATIVE mg/dL
Ketones, ur: NEGATIVE mg/dL
Leukocytes,Ua: NEGATIVE
Nitrite: NEGATIVE
Protein, ur: NEGATIVE mg/dL
Specific Gravity, Urine: 1.02 (ref 1.005–1.030)
pH: 7.5 (ref 5.0–8.0)

## 2020-04-17 LAB — URINALYSIS, MICROSCOPIC (REFLEX): RBC / HPF: >50 RBC/hpf (ref 0–5)

## 2020-04-17 LAB — PREGNANCY, URINE: Preg Test, Ur: NEGATIVE

## 2020-04-17 LAB — D-DIMER, QUANTITATIVE: D-Dimer, Quant: <0.27 ug/mL-FEU (ref 0.00–0.50)

## 2020-04-17 MED ORDER — CLINDAMYCIN HCL 300 MG PO CAPS: 300.0000 mg | ORAL_CAPSULE | Freq: Four times a day (QID) | ORAL | 0 refills | Status: DC

## 2020-04-17 MED ORDER — CYCLOBENZAPRINE HCL 5 MG PO TABS
5.0000 mg | ORAL_TABLET | Freq: Once | ORAL | Status: AC
  Administered 2020-04-17: 5 mg via ORAL
  Filled 2020-04-17: qty 1

## 2020-04-17 MED ORDER — ALUM & MAG HYDROXIDE-SIMETH 200-200-20 MG/5ML PO SUSP
30.0000 mL | Freq: Once | ORAL | Status: AC
  Administered 2020-04-17: 30 mL via ORAL
  Filled 2020-04-17: qty 30

## 2020-04-17 MED ORDER — CLINDAMYCIN HCL 150 MG PO CAPS
300.0000 mg | ORAL_CAPSULE | Freq: Once | ORAL | Status: AC
  Administered 2020-04-17: 300 mg via ORAL
  Filled 2020-04-17: qty 2

## 2020-04-17 MED ORDER — CYCLOBENZAPRINE HCL 10 MG PO TABS: 10.0000 mg | ORAL_TABLET | Freq: Two times a day (BID) | ORAL | 0 refills | Status: DC | PRN

## 2020-04-17 MED ORDER — LIDOCAINE VISCOUS HCL 2 % MT SOLN
15.0000 mL | Freq: Once | OROMUCOSAL | Status: AC
  Administered 2020-04-17: 15 mL via ORAL
  Filled 2020-04-17: qty 15

## 2020-04-17 NOTE — Discharge Instructions (Signed)
Take motrin for pain   Take flexeril for muscle spasms   Take clindamycin for paronychia   See your doctor   Return to ER if you have worse chest pain, trouble breathing.

## 2020-04-17 NOTE — ED Provider Notes (Signed)
  Physical Exam  BP 119/73   Pulse 85   Temp 98.1 F (36.7 C) (Oral)   Resp 18   SpO2 99%   Physical Exam  ED Course/Procedures     Procedures  MDM  Care assumed at 3 pm.  Patient has some chest pain.  Consider muscle spasms versus gastritis versus PE.  Patient also has a paronychia that will need antibiotics.  Sign of any D-dimer and patient likely needs antibiotic  4:32 PM D-dimer neg. patient's pain is reproducible.  Will discharge patient home with Flexeril and also clindamycin for paronychia    Drenda Freeze, MD 04/17/20 [phone]

## 2020-04-17 NOTE — ED Triage Notes (Addendum)
She feels like she has gas in her chest. Symptoms since yesterday. EKG. Pain to her left great toe. She was started on Metformin in December for new onset diabetes but stopped the medication in February. Epigastric pain increases with movement.

## 2020-04-17 NOTE — Progress Notes (Signed)
SUBJECTIVE:  35 y.o. female complains of milky vaginal discharge for 2 week(s). Denies abnormal vaginal bleeding or significant pelvic pain or fever. Denies history of known exposure to STD.  LMP: 02/28/20 - Pt with irregular cycles. States she bled for whole month of February. On OCP.   OBJECTIVE:  She appears well, afebrile.   ASSESSMENT:  Vaginal Discharge     PLAN:  GC, chlamydia, trichomonas, BVAG, CVAG probe sent to lab. Treatment: To be determined once lab results are received ROV prn if symptoms persist or worsen.
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
## 2020-04-18 LAB — CERVICOVAGINAL ANCILLARY ONLY
Bacterial Vaginitis (gardnerella): NEGATIVE
Candida Glabrata: NEGATIVE
Candida Vaginitis: NEGATIVE
Chlamydia: NEGATIVE
Comment: NEGATIVE
Comment: NEGATIVE
Comment: NEGATIVE
Comment: NEGATIVE
Comment: NEGATIVE
Comment: NORMAL
Neisseria Gonorrhea: NEGATIVE
Trichomonas: NEGATIVE

## 2020-04-19 ENCOUNTER — Ambulatory Visit: Payer: Self-pay | Admitting: Nurse Practitioner

## 2020-04-21 ENCOUNTER — Other Ambulatory Visit: Payer: Self-pay | Admitting: Obstetrics and Gynecology

## 2020-05-01 ENCOUNTER — Other Ambulatory Visit: Payer: Self-pay

## 2020-05-01 ENCOUNTER — Ambulatory Visit: Payer: Managed Care, Other (non HMO) | Admitting: Nurse Practitioner

## 2020-05-01 ENCOUNTER — Encounter: Payer: Self-pay | Admitting: Nurse Practitioner

## 2020-05-01 VITALS — BP 128/78 | HR 90 | Temp 98.6°F | Ht 68.2 in | Wt 384.6 lb

## 2020-05-01 DIAGNOSIS — G4733 Obstructive sleep apnea (adult) (pediatric): Secondary | ICD-10-CM

## 2020-05-01 DIAGNOSIS — E119 Type 2 diabetes mellitus without complications: Secondary | ICD-10-CM

## 2020-05-01 DIAGNOSIS — Z6841 Body Mass Index (BMI) 40.0 and over, adult: Secondary | ICD-10-CM

## 2020-05-01 DIAGNOSIS — L03032 Cellulitis of left toe: Secondary | ICD-10-CM | POA: Diagnosis not present

## 2020-05-01 MED ORDER — OZEMPIC (0.25 OR 0.5 MG/DOSE) 2 MG/1.5ML ~~LOC~~ SOPN: 0.5000 mg | PEN_INJECTOR | SUBCUTANEOUS | 1 refills | Status: DC

## 2020-05-01 NOTE — Patient Instructions (Signed)
Diabetes Mellitus Basics  Diabetes mellitus, or diabetes, is a long-term (chronic) disease. It occurs when the body does not properly use sugar (glucose) that is released from food after you eat. Diabetes mellitus may be caused by one or both of these problems:  Your pancreas does not make enough of a hormone called insulin.  Your body does not react in a normal way to the insulin that it makes. Insulin lets glucose enter cells in your body. This gives you energy. If you have diabetes, glucose cannot get into cells. This causes high blood glucose (hyperglycemia). How to treat and manage diabetes You may need to take insulin or other diabetes medicines daily to keep your glucose in balance. If you are prescribed insulin, you will learn how to give yourself insulin by injection. You may need to adjust the amount of insulin you take based on the foods that you eat. You will need to check your blood glucose levels using a glucose monitor as told by your health care provider. The readings can help determine if you have low or high blood glucose. Generally, you should have these blood glucose levels:  Before meals (preprandial): 80-130 mg/dL (4.4-7.2 mmol/L).  After meals (postprandial): below 180 mg/dL (10 mmol/L).  Hemoglobin A1c (HbA1c) level: less than 7%. Your health care provider will set treatment goals for you. Keep all follow-up visits. This is important. Follow these instructions at home: Diabetes medicines Take your diabetes medicines every day as told by your health care provider. List your diabetes medicines here:  Name of medicine: ______________________________ ? Amount (dose): _______________ Time (a.m./p.m.): _______________ Notes: ___________________________________  Name of medicine: ______________________________ ? Amount (dose): _______________ Time (a.m./p.m.): _______________ Notes: ___________________________________  Name of medicine:  ______________________________ ? Amount (dose): _______________ Time (a.m./p.m.): _______________ Notes: ___________________________________ Insulin If you use insulin, list the types of insulin you use here:  Insulin type: ______________________________ ? Amount (dose): _______________ Time (a.m./p.m.): _______________Notes: ___________________________________  Insulin type: ______________________________ ? Amount (dose): _______________ Time (a.m./p.m.): _______________ Notes: ___________________________________  Insulin type: ______________________________ ? Amount (dose): _______________ Time (a.m./p.m.): _______________ Notes: ___________________________________  Insulin type: ______________________________ ? Amount (dose): _______________ Time (a.m./p.m.): _______________ Notes: ___________________________________  Insulin type: ______________________________ ? Amount (dose): _______________ Time (a.m./p.m.): _______________ Notes: ___________________________________ Managing blood glucose Check your blood glucose levels using a glucose monitor as told by your health care provider. Write down the times that you check your glucose levels here:  Time: _______________ Notes: ___________________________________  Time: _______________ Notes: ___________________________________  Time: _______________ Notes: ___________________________________  Time: _______________ Notes: ___________________________________  Time: _______________ Notes: ___________________________________  Time: _______________ Notes: ___________________________________   Low blood glucose Low blood glucose (hypoglycemia) is when glucose is at or below 70 mg/dL (3.9 mmol/L). Symptoms may include:  Feeling: ? Hungry. ? Sweaty and clammy. ? Irritable or easily upset. ? Dizzy. ? Sleepy.  Having: ? A fast heartbeat. ? A headache. ? A change in your vision. ? Numbness around the mouth, lips, or  tongue.  Having trouble with: ? Moving (coordination). ? Sleeping. Treating low blood glucose To treat low blood glucose, eat or drink something containing sugar right away. If you can think clearly and swallow safely, follow the 15:15 rule:  Take 15 grams of a fast-acting carb (carbohydrate), as told by your health care provider.  Some fast-acting carbs are: ? Glucose tablets: take 3-4 tablets. ? Hard candy: eat 3-5 pieces. ? Fruit juice: drink 4 oz (120 mL). ? Regular (not diet) soda: drink 4-6 oz (120-180 mL). ? Honey or sugar:   eat 1 Tbsp (15 mL).  Check your blood glucose levels 15 minutes after you take the carb.  If your glucose is still at or below 70 mg/dL (3.9 mmol/L), take 15 grams of a carb again.  If your glucose does not go above 70 mg/dL (3.9 mmol/L) after 3 tries, get help right away.  After your glucose goes back to normal, eat a meal or a snack within 1 hour. Treating very low blood glucose If your glucose is at or below 54 mg/dL (3 mmol/L), you have very low blood glucose (severe hypoglycemia). This is an emergency. Do not wait to see if the symptoms will go away. Get medical help right away. Call your local emergency services (911 in the [place] Do not drive yourself to the hospital. Questions to ask your health care provider  Should I talk with a diabetes educator?  What equipment will I need to care for myself at home?  What diabetes medicines do I need? When should I take them?  How often do I need to check my blood glucose levels?  What number can I call if I have questions?  When is my follow-up visit?  Where can I find a support group for people with diabetes? Where to find more information  American Diabetes Association: www.diabetes.org  Association of Diabetes Care and Education Specialists: www.diabeteseducator.org Contact a health care provider if:  Your blood glucose is at or above 240 mg/dL (13.3 mmol/L) for 2 days in a row.  You have  been sick or have had a fever for 2 days or more, and you are not getting better.  You have any of these problems for more than 6 hours: ? You cannot eat or drink. ? You feel nauseous. ? You vomit. ? You have diarrhea. Get help right away if:  Your blood glucose is lower than 54 mg/dL (3 mmol/L).  You get confused.  You have trouble thinking clearly.  You have trouble breathing. These symptoms may represent a serious problem that is an emergency. Do not wait to see if the symptoms will go away. Get medical help right away. Call your local emergency services (911 in the [place] Do not drive yourself to the hospital. Summary  Diabetes mellitus is a chronic disease that occurs when the body does not properly use sugar (glucose) that is released from food after you eat.  Take insulin and diabetes medicines as told.  Check your blood glucose every day, as often as told.  Keep all follow-up visits. This is important. This information is not intended to replace advice given to you by your health care provider. Make sure you discuss any questions you have with your health care provider. Document Revised: 05/17/2019 Document Reviewed: 05/17/2019 Elsevier Patient Education  2021 Elsevier Inc.  

## 2020-05-01 NOTE — Progress Notes (Signed)
I,Yamilka Roman [employer] as a Education administrator for [employer], FNP.,have documented all relevant documentation on the behalf of Minette Brine, FNP,as directed by  Minette Brine, FNP while in the presence of Minette Brine, [place]. This visit occurred during the SARS-CoV-2 public health emergency.  Safety protocols were in place, including screening questions prior to the visit, additional usage of staff PPE, and extensive cleaning of exam room while observing appropriate contact time as indicated for disinfecting solutions.  Subjective:     Patient ID: Kristin Love , female    DOB: 12-25-1985 , 35 y.o.   MRN: [id number]   Chief Complaint  Patient presents with  . Diabetes    HPI  Patient here for a f/u on her diabetes.  Patient stated she has stopped taking all her meds because one of them was causing her to bleed constantly.  Wt Readings from Last 3 Encounters: 05/01/20 : (!) 384 lb 9.6 oz (174.5 kg) 04/17/20 : (!) 382 lb (173.3 kg) 03/12/20 : (!) 393 lb (178.3 kg)  She is now on birth control (oral). She has been on for about 1.5 months. She is no longer having a cycle.   She admits to being a stress eater and has been under increased stress. She has a son who has been diagnosed with ADHD.   She was seen in the ER for costochondritis and paronychia to her toe.    Unable to tolerate metformin and had persistent vaginal bleeding She was diagnosed with severe sleep apnea  Diabetes She presents for her follow-up diabetic visit. She has type 2 diabetes mellitus. Her disease course has been stable. There are no hypoglycemic associated symptoms. Pertinent negatives for hypoglycemia include no dizziness or headaches. Pertinent negatives for diabetes include no chest pain, no polydipsia, no polyphagia and no polyuria. There are no hypoglycemic complications. There are no diabetic complications. Risk factors for coronary artery disease include obesity. Current diabetic treatments: she is not taking  Ozempic or metformin.     Past Medical History:  Diagnosis Date  . Diabetes mellitus without complication ([place])   . Herpes   . PCOS (polycystic ovarian syndrome)   . Vaginal Pap smear, abnormal    had colposcopy repeat pap negative     Family History  Problem Relation Age of Onset  . Hypertension Mother   . Hypertension Father   . Diabetes Father   . Diabetes Maternal Grandmother   . Hypertension Maternal Grandmother   . Diabetes Maternal Grandfather   . Hypertension Maternal Grandfather   . Congestive Heart Failure Maternal Grandfather   . Diabetes Paternal Grandmother   . Hypertension Paternal Grandmother   . Diabetes Paternal Grandfather   . Hypertension Paternal Grandfather   . Stroke Paternal Grandfather      Current Outpatient Medications:  .  blood glucose meter kit and supplies KIT, Dispense based on patient and insurance preference. Check blood sugar two times a day and as needed (FOR ICD-9 250.00, 250.01)., Disp: 1 each, Rfl: 0 .  EPINEPHrine 0.3 mg/0.3 mL IJ SOAJ injection, Inject 0.3 mg into the muscle as needed for anaphylaxis., Disp: 1 each, Rfl: 2 .  Levonorgest-Eth Estrad-Fe Bisg (BALCOLTRA) 0.1-20 MG-MCG(21) TABS, Take 1 tablet by mouth daily., Disp: , Rfl:  .  valACYclovir (VALTREX) 500 MG tablet, Take 1 tablet (500 mg total) by mouth daily. Can increase to twice a day for 5 days in the event of a recurrence, Disp: 30 tablet, Rfl: 12 .  megestrol (MEGACE) 40 MG tablet,  megestrol 40 mg tablet  TAKE 1 TABLET BY MOUTH TWICE A DAY (Patient not taking: Reported on 05/01/2020), Disp: , Rfl:  .  Semaglutide,0.25 or 0.5MG/DOS, (OZEMPIC, 0.25 OR 0.5 MG/DOSE,) 2 MG/1.5ML SOPN, Inject 0.5 mg into the skin once a week., Disp: 4.5 mL, Rfl: 1   Allergies  Allergen Reactions  . Shellfish Allergy Anaphylaxis  . Fish Allergy Swelling    Raw shrimp     Review of Systems  Constitutional: Negative.   Respiratory: Negative.   Cardiovascular: Negative for chest pain,  palpitations and leg swelling.  Endocrine: Negative for polydipsia, polyphagia and polyuria.  Neurological: Negative for dizziness and headaches.  Psychiatric/Behavioral: Negative.      Today's Vitals   05/01/20 1156  BP: 128/78  Pulse: 90  Temp: 98.6 F (37 C)  TempSrc: Oral  Weight: (!) 384 lb 9.6 oz (174.5 kg)  Height: 5' 8.2" (1.732 m)  PainSc: 0-No pain   Body mass index is 58.14 kg/m.   Objective:  Physical Exam Constitutional:      General: She is not in acute distress.    Appearance: Normal appearance. She is obese.  Cardiovascular:     Rate and Rhythm: Normal rate.     Pulses: Normal pulses.     Heart sounds: Normal heart sounds. No murmur heard.   Pulmonary:     Effort: Pulmonary effort is normal. No respiratory distress.     Breath sounds: Normal breath sounds. No wheezing.  Abdominal:     Palpations: Abdomen is soft.  Musculoskeletal:        General: Normal range of motion.  Skin:    General: Skin is warm and dry.     Capillary Refill: Capillary refill takes less than 2 seconds.  Neurological:     General: No focal deficit present.     Mental Status: She is alert and oriented to person, place, and time.     Cranial Nerves: No cranial nerve deficit.     Motor: No weakness.  Psychiatric:        Mood and Affect: Mood normal.        Behavior: Behavior normal.        Thought Content: Thought content normal.        Judgment: Judgment normal.         Assessment And Plan:     1. Type 2 diabetes mellitus without complication, without long-term current use of insulin (HCC)  Chronic, controlled  Continue with current medications  Encouraged to limit intake of sugary foods and drinks  Encouraged to increase physical activity to 150 minutes per week as tolerated Will start Ozempic 0.25 mg for 4 weeks then increase to 0.5 mg for 2 weeks the initial pen will last for 6 weeks.   - Semaglutide,0.25 or 0.5MG/DOS, (OZEMPIC, 0.25 OR 0.5 MG/DOSE,) 2 MG/1.5ML  SOPN; Inject 0.5 mg into the skin once a week.  Dispense: 4.5 mL; Refill: 1 - Lipid panel - CMP14+EGFR - Hemoglobin A1c  2. Class 3 severe obesity due to excess calories without serious comorbidity with body mass index (BMI) of 50.0 to 59.9 in adult [facility])  Chronic  Discussed healthy diet and regular exercise options   Encouraged to exercise at least 150 minutes per week with 2 days of strength training  3. Severe obstructive sleep apnea  She has been advised to follow up with the sleep doctor for her split study this may help to improve her ability to lose weight  I have explained  the risk of cardiac events with sleep apnea  4. Paronychia of great toe, left  She has not started the medication she was given in the ER, she does have redness to her left great toe.   Patient was given opportunity to ask questions. Patient verbalized understanding of the plan and was able to repeat key elements of the plan. All questions were answered to their satisfaction.  Minette Brine, FNP   I, Minette Brine, FNP, have reviewed all documentation for this visit. The documentation on 05/10/20 for the exam, diagnosis, procedures, and orders are all accurate and complete.   IF YOU HAVE BEEN REFERRED TO A SPECIALIST, IT MAY TAKE 1-2 WEEKS TO SCHEDULE/PROCESS THE REFERRAL. IF YOU HAVE NOT HEARD FROM US/SPECIALIST IN TWO WEEKS, PLEASE GIVE [place] A CALL AT [phone].   THE PATIENT IS ENCOURAGED TO PRACTICE SOCIAL DISTANCING DUE TO THE COVID-19 PANDEMIC.

## 2020-05-02 LAB — CMP14+EGFR
ALT: 12 IU/L (ref 0–32)
AST: 12 IU/L (ref 0–40)
Albumin/Globulin Ratio: 1.2 (ref 1.2–2.2)
Albumin: 3.9 g/dL (ref 3.8–4.8)
Alkaline Phosphatase: 66 IU/L (ref 44–121)
BUN/Creatinine Ratio: 16 (ref 9–23)
BUN: 10 mg/dL (ref 6–20)
Bilirubin Total: 0.3 mg/dL (ref 0.0–1.2)
CO2: 20 mmol/L (ref 20–29)
Calcium: 9 mg/dL (ref 8.7–10.2)
Chloride: 104 mmol/L (ref 96–106)
Creatinine, Ser: 0.64 mg/dL (ref 0.57–1.00)
Globulin, Total: 3.2 g/dL (ref 1.5–4.5)
Glucose: 88 mg/dL (ref 65–99)
Potassium: 4.5 mmol/L (ref 3.5–5.2)
Sodium: 141 mmol/L (ref 134–144)
Total Protein: 7.1 g/dL (ref 6.0–8.5)
eGFR: 120 mL/min/{1.73_m2} (ref >59)

## 2020-05-02 LAB — LIPID PANEL
Chol/HDL Ratio: 4.1 ratio (ref 0.0–4.4)
Cholesterol, Total: 175 mg/dL (ref 100–199)
HDL: 43 mg/dL (ref >39)
LDL Chol Calc (NIH): 119 mg/dL — ABNORMAL HIGH (ref 0–99)
Triglycerides: 71 mg/dL (ref 0–149)
VLDL Cholesterol Cal: 13 mg/dL (ref 5–40)

## 2020-05-02 LAB — HEMOGLOBIN A1C
Est. average glucose Bld gHb Est-mCnc: 131 mg/dL
Hgb A1c MFr Bld: 6.2 % — ABNORMAL HIGH (ref 4.8–5.6)

## 2020-05-03 NOTE — Telephone Encounter (Signed)
Per .Damian Leavell ([facility])  "I just got off the phone with Kristin Love in regards to the CPAP her doctor had ordered. She was originally scheduled on March 31 but did a no show to her appointment. We made three attempts to contact her to reschedule her for this appointment, in which she just replied and stated she wanted [place] to cancel the order. She had stated she cannot afford the machine. I have voided this order accordingly but let the patient know if she wanted to reinstate, all she had to do was call. "

## 2020-05-21 ENCOUNTER — Other Ambulatory Visit: Payer: Self-pay

## 2020-05-22 ENCOUNTER — Other Ambulatory Visit: Payer: Self-pay

## 2020-05-22 MED ORDER — BALCOLTRA 0.1-20 MG-MCG(21) PO TABS
1.0000 | ORAL_TABLET | Freq: Every day | ORAL | 5 refills | Status: DC
Start: 2020-05-22 — End: 2020-07-25

## 2020-06-19 ENCOUNTER — Other Ambulatory Visit (HOSPITAL_COMMUNITY)
Admission: RE | Admit: 2020-06-19 | Discharge: 2020-06-19 | Disposition: A | Discharge status: Home / Self Care | Payer: Managed Care, Other (non HMO) | Source: Ambulatory Visit | Attending: Obstetrics | Admitting: Obstetrics

## 2020-06-19 ENCOUNTER — Ambulatory Visit (INDEPENDENT_AMBULATORY_CARE_PROVIDER_SITE_OTHER): Payer: Managed Care, Other (non HMO)

## 2020-06-19 DIAGNOSIS — N898 Other specified noninflammatory disorders of vagina: Secondary | ICD-10-CM

## 2020-06-19 NOTE — Progress Notes (Signed)
SUBJECTIVE:  35 y.o. female complains of vag discharge for a couple of days.  Denies abnormal vaginal bleeding or significant pelvic pain or fever. No UTI symptoms. Denies history of known exposure to STD.  No LMP recorded. (Menstrual status: Irregular Periods).  OBJECTIVE:  She appears well, afebrile. Urine dipstick:   ASSESSMENT:  Vaginal Discharge:small amount  Vaginal Odor: none     PLAN:  GC, chlamydia, trichomonas, BVAG, CVAG probe sent to lab. Treatment: To be determined once lab results are received ROV prn if symptoms persist or worsen.

## 2020-06-20 LAB — CERVICOVAGINAL ANCILLARY ONLY
Bacterial Vaginitis (gardnerella): POSITIVE — AB
Candida Glabrata: NEGATIVE
Candida Vaginitis: NEGATIVE
Chlamydia: NEGATIVE
Comment: NEGATIVE
Comment: NEGATIVE
Comment: NEGATIVE
Comment: NEGATIVE
Comment: NEGATIVE
Comment: NORMAL
Neisseria Gonorrhea: NEGATIVE
Trichomonas: NEGATIVE

## 2020-06-21 ENCOUNTER — Other Ambulatory Visit: Payer: Self-pay | Admitting: Obstetrics and Gynecology

## 2020-06-21 MED ORDER — METRONIDAZOLE 500 MG PO TABS: 500.0000 mg | ORAL_TABLET | Freq: Two times a day (BID) | ORAL | 0 refills | Status: DC

## 2020-06-21 NOTE — Progress Notes (Signed)
Patient was assessed and managed by nursing staff during this encounter. I have reviewed the chart and agree with the documentation and plan. I have also made any necessary editorial changes.  Mora Bellman, MD 06/21/2020 10:42 AM

## 2020-06-22 ENCOUNTER — Ambulatory Visit: Payer: Managed Care, Other (non HMO) | Admitting: Obstetrics

## 2020-06-22 ENCOUNTER — Ambulatory Visit: Payer: Managed Care, Other (non HMO)

## 2020-07-09 ENCOUNTER — Encounter: Payer: Self-pay | Admitting: Nurse Practitioner

## 2020-07-17 ENCOUNTER — Other Ambulatory Visit: Payer: Self-pay

## 2020-07-17 ENCOUNTER — Encounter: Payer: Self-pay | Admitting: Nurse Practitioner

## 2020-07-17 ENCOUNTER — Ambulatory Visit: Payer: Managed Care, Other (non HMO) | Admitting: Nurse Practitioner

## 2020-07-17 VITALS — BP 128/78 | HR 85 | Temp 98.0°F | Ht 68.2 in | Wt 390.2 lb

## 2020-07-17 DIAGNOSIS — E119 Type 2 diabetes mellitus without complications: Secondary | ICD-10-CM

## 2020-07-17 DIAGNOSIS — Z6841 Body Mass Index (BMI) 40.0 and over, adult: Secondary | ICD-10-CM | POA: Diagnosis not present

## 2020-07-17 DIAGNOSIS — R41840 Attention and concentration deficit: Secondary | ICD-10-CM

## 2020-07-17 NOTE — Patient Instructions (Signed)
Diabetes Mellitus Basics  Diabetes mellitus, or diabetes, is a long-term (chronic) disease. It occurs when the body does not properly use sugar (glucose) that is released from food after you eat. Diabetes mellitus may be caused by one or both of these problems: Your pancreas does not make enough of a hormone called insulin. Your body does not react in a normal way to the insulin that it makes. Insulin lets glucose enter cells in your body. This gives you energy. If you have diabetes, glucose cannot get into cells. This causes high blood glucose (hyperglycemia). How to treat and manage diabetes You may need to take insulin or other diabetes medicines daily to keep your glucose in balance. If you are prescribed insulin, you will learn how to give yourself insulin by injection. You may need to adjust the amount of insulin youtake based on the foods that you eat. You will need to check your blood glucose levels using a glucose monitor as told by your health care provider. The readings can help determine if you havelow or high blood glucose. Generally, you should have these blood glucose levels: Before meals (preprandial): 80-130 mg/dL (4.4-7.2 mmol/L). After meals (postprandial): below 180 mg/dL (10 mmol/L). Hemoglobin A1c (HbA1c) level: less than 7%. Your health care provider will set treatment goals for you. Keep all follow-up visits. This is important. Follow these instructions at home: Diabetes medicines Take your diabetes medicines every day as told by your health care provider. List your diabetes medicines here: Name of medicine: ______________________________ Amount (dose): _______________ Time (a.m./p.m.): _______________ Notes: ___________________________________ Name of medicine: ______________________________ Amount (dose): _______________ Time (a.m./p.m.): _______________ Notes: ___________________________________ Name of medicine: ______________________________ Amount (dose):  _______________ Time (a.m./p.m.): _______________ Notes: ___________________________________ Insulin If you use insulin, list the types of insulin you use here: Insulin type: ______________________________ Amount (dose): _______________ Time (a.m./p.m.): _______________Notes: ___________________________________ Insulin type: ______________________________ Amount (dose): _______________ Time (a.m./p.m.): _______________ Notes: ___________________________________ Insulin type: ______________________________ Amount (dose): _______________ Time (a.m./p.m.): _______________ Notes: ___________________________________ Insulin type: ______________________________ Amount (dose): _______________ Time (a.m./p.m.): _______________ Notes: ___________________________________ Insulin type: ______________________________ Amount (dose): _______________ Time (a.m./p.m.): _______________ Notes: ___________________________________ Managing blood glucose  Check your blood glucose levels using a glucose monitor as told by your healthcare provider. Write down the times that you check your glucose levels here: Time: _______________ Notes: ___________________________________ Time: _______________ Notes: ___________________________________ Time: _______________ Notes: ___________________________________ Time: _______________ Notes: ___________________________________ Time: _______________ Notes: ___________________________________ Time: _______________ Notes: ___________________________________  Low blood glucose Low blood glucose (hypoglycemia) is when glucose is at or below 70 mg/dL (3.9 mmol/L). Symptoms may include: Feeling: Hungry. Sweaty and clammy. Irritable or easily upset. Dizzy. Sleepy. Having: A fast heartbeat. A headache. A change in your vision. Numbness around the mouth, lips, or tongue. Having trouble with: Moving (coordination). Sleeping. Treating low blood glucose To treat low blood  glucose, eat or drink something containing sugar right away. If you can think clearly and swallow safely, follow the 15:15 rule: Take 15 grams of a fast-acting carb (carbohydrate), as told by your health care provider. Some fast-acting carbs are: Glucose tablets: take 3-4 tablets. Hard candy: eat 3-5 pieces. Fruit juice: drink 4 oz (120 mL). Regular (not diet) soda: drink 4-6 oz (120-180 mL). Honey or sugar: eat 1 Tbsp (15 mL). Check your blood glucose levels 15 minutes after you take the carb. If your glucose is still at or below 70 mg/dL (3.9 mmol/L), take 15 grams of a carb again. If your glucose does not go above 70 mg/dL (3.9 mmol/L) after 3 tries, get  help right away. After your glucose goes back to normal, eat a meal or a snack within 1 hour. Treating very low blood glucose If your glucose is at or below 54 mg/dL (3 mmol/L), you have very low blood glucose (severe hypoglycemia). This is an emergency. Do not wait to see if the symptoms will go away. Get medical help right away. Call your local emergency services (911 in the [place] Do not drive yourself to the hospital. Questions to ask your health care provider Should I talk with a diabetes educator? What equipment will I need to care for myself at home? What diabetes medicines do I need? When should I take them? How often do I need to check my blood glucose levels? What number can I call if I have questions? When is my follow-up visit? Where can I find a support group for people with diabetes? Where to find more information American Diabetes Association: www.diabetes.org Association of Diabetes Care and Education Specialists: www.diabeteseducator.org Contact a health care provider if: Your blood glucose is at or above 240 mg/dL (13.3 mmol/L) for 2 days in a row. You have been sick or have had a fever for 2 days or more, and you are not getting better. You have any of these problems for more than 6 hours: You cannot eat or  drink. You feel nauseous. You vomit. You have diarrhea. Get help right away if: Your blood glucose is lower than 54 mg/dL (3 mmol/L). You get confused. You have trouble thinking clearly. You have trouble breathing. These symptoms may represent a serious problem that is an emergency. Do not wait to see if the symptoms will go away. Get medical help right away. Call your local emergency services (911 in the [place] Do not drive yourself to the hospital. Summary Diabetes mellitus is a chronic disease that occurs when the body does not properly use sugar (glucose) that is released from food after you eat. Take insulin and diabetes medicines as told. Check your blood glucose every day, as often as told. Keep all follow-up visits. This is important. This information is not intended to replace advice given to you by your health care provider. Make sure you discuss any questions you have with your healthcare provider. Document Revised: 05/17/2019 Document Reviewed: 05/17/2019 Elsevier Patient Education  [place].

## 2020-07-17 NOTE — Progress Notes (Signed)
I,Yamilka Roman [employer] as a Education administrator for [employer], FNP.,have documented all relevant documentation on the behalf of Minette Brine, FNP,as directed by  Minette Brine, FNP while in the presence of Minette Brine, [place].  This visit occurred during the SARS-CoV-2 public health emergency.  Safety protocols were in place, including screening questions prior to the visit, additional usage of staff PPE, and extensive cleaning of exam room while observing appropriate contact time as indicated for disinfecting solutions.  Subjective:     Patient ID: Kristin Love , female    DOB: [birth year] , 35 y.o.   MRN: [id number]   Chief Complaint  Patient presents with   difficulty focusing    Patient stated she has always had difficulty focusing but she feels it has gotten worse towards the end of may.     HPI  She is having problems with focusing. She was pulled to the side by her manager. She feels like this has always had problems with her focus. She works at [employer].   She has not been able to remember taking her Ozempic  Wt Readings from Last 3 Encounters: 07/17/20 : (!) 390 lb 3.2 oz (177 kg) 05/01/20 : (!) 384 lb 9.6 oz (174.5 kg) 04/17/20 : (!) 382 lb (173.3 kg)    Diabetes She presents for her follow-up diabetic visit. Diabetes type: prediabetes. Pertinent negatives for hypoglycemia include no dizziness or headaches. Pertinent negatives for diabetes include no chest pain.    Past Medical History:  Diagnosis Date   Diabetes mellitus without complication (HCC)    Herpes    PCOS (polycystic ovarian syndrome)    Vaginal Pap smear, abnormal    had colposcopy repeat pap negative     Family History  Problem Relation Age of Onset   Hypertension Mother    Hypertension Father    Diabetes Father    Diabetes Maternal Grandmother    Hypertension Maternal Grandmother    Diabetes Maternal Grandfather    Hypertension Maternal Grandfather    Congestive Heart Failure Maternal  Grandfather    Diabetes Paternal Grandmother    Hypertension Paternal Grandmother    Diabetes Paternal Grandfather    Hypertension Paternal Grandfather    Stroke Paternal Grandfather      Current Outpatient Medications:    blood glucose meter kit and supplies KIT, Dispense based on patient and insurance preference. Check blood sugar two times a day and as needed (FOR ICD-9 250.00, 250.01)., Disp: 1 each, Rfl: 0   EPINEPHrine 0.3 mg/0.3 mL IJ SOAJ injection, Inject 0.3 mg into the muscle as needed for anaphylaxis., Disp: 1 each, Rfl: 2   Levonorgest-Eth Estrad-Fe Bisg (BALCOLTRA) 0.1-20 MG-MCG(21) TABS, Take 1 tablet by mouth daily., Disp: 28 tablet, Rfl: 5   valACYclovir (VALTREX) 500 MG tablet, TAKE 1 TABLET BY MOUTH DAILY. CAN INCREASE TO TWICE DAILY FOR 5 DAYS IN THE EVENT OF RECURRENCE, Disp: 30 tablet, Rfl: 12   Semaglutide,0.25 or 0.5MG/DOS, (OZEMPIC, 0.25 OR 0.5 MG/DOSE,) 2 MG/1.5ML SOPN, Inject 0.5 mg into the skin once a week. (Patient not taking: Reported on 07/17/2020), Disp: 4.5 mL, Rfl: 1   Allergies  Allergen Reactions   Shellfish Allergy Anaphylaxis   Fish Allergy Swelling    Raw shrimp     Review of Systems  Constitutional: Negative.   Respiratory: Negative.  Negative for cough and wheezing.   Cardiovascular: Negative.  Negative for chest pain, palpitations and leg swelling.  Neurological:  Negative for dizziness and headaches.  Psychiatric/Behavioral: Negative.  Today's Vitals   07/17/20 1644  BP: 128/78  Pulse: 85  Temp: 98 F (36.7 C)  Weight: (!) 390 lb 3.2 oz (177 kg)  Height: 5' 8.2" (1.732 m)   Body mass index is 58.98 kg/m.   Objective:  Physical Exam Vitals reviewed.  Constitutional:      Appearance: Normal appearance.  Cardiovascular:     Rate and Rhythm: Normal rate and regular rhythm.     Pulses: Normal pulses.     Heart sounds: Normal heart sounds. No murmur heard. Pulmonary:     Effort: Pulmonary effort is normal. No respiratory  distress.     Breath sounds: Normal breath sounds. No wheezing.  Neurological:     General: No focal deficit present.     Mental Status: She is alert and oriented to person, place, and time.     Cranial Nerves: No cranial nerve deficit.     Motor: No weakness.  Psychiatric:        Mood and Affect: Mood normal.        Behavior: Behavior normal.        Thought Content: Thought content normal.        Judgment: Judgment normal.        Assessment And Plan:     1. Type 2 diabetes mellitus without complication, without long-term current use of insulin (HCC) Chronic, fairly controlled She has not been consistent with taking her medications She is encouraged to be compliant with taking medications Encouraged to limit intake of sugary foods and drinks Encouraged to increase physical activity to 150 minutes per week  2. Class 3 severe obesity due to excess calories without serious comorbidity with body mass index (BMI) of 50.0 to 59.9 in adult [facility]) Chronic Discussed healthy diet and regular exercise options  Encouraged to exercise at least 150 minutes per week with 2 days of strength training  3. Concentration deficit Will refer to psychiatry and check thyroid to make sure there is not a metabolic cause - TSH - Ambulatory referral to Psychiatry    Patient was given opportunity to ask questions. Patient verbalized understanding of the plan and was able to repeat key elements of the plan. All questions were answered to their satisfaction.  Minette Brine, FNP   I, Minette Brine, FNP, have reviewed all documentation for this visit. The documentation on 07/17/20 for the exam, diagnosis, procedures, and orders are all accurate and complete.   IF YOU HAVE BEEN REFERRED TO A SPECIALIST, IT MAY TAKE 1-2 WEEKS TO SCHEDULE/PROCESS THE REFERRAL. IF YOU HAVE NOT HEARD FROM US/SPECIALIST IN TWO WEEKS, PLEASE GIVE [place] A CALL AT [phone].   THE PATIENT IS ENCOURAGED TO PRACTICE SOCIAL  DISTANCING DUE TO THE COVID-19 PANDEMIC.

## 2020-07-18 LAB — TSH: TSH: 1.49 u[IU]/mL (ref 0.450–4.500)

## 2020-07-24 ENCOUNTER — Ambulatory Visit: Payer: Managed Care, Other (non HMO) | Admitting: Family Medicine

## 2020-07-25 DIAGNOSIS — N939 Abnormal uterine and vaginal bleeding, unspecified: Secondary | ICD-10-CM

## 2020-07-25 MED ORDER — BALCOLTRA 0.1-20 MG-MCG(21) PO TABS: 1.0000 | ORAL_TABLET | Freq: Every day | ORAL | 0 refills | Status: DC

## 2020-07-31 ENCOUNTER — Ambulatory Visit: Payer: Managed Care, Other (non HMO) | Admitting: Nurse Practitioner

## 2020-08-02 ENCOUNTER — Encounter: Payer: Self-pay | Admitting: Nurse Practitioner

## 2020-08-06 ENCOUNTER — Other Ambulatory Visit: Payer: Self-pay

## 2020-08-06 ENCOUNTER — Encounter: Payer: Self-pay | Admitting: Obstetrics and Gynecology

## 2020-08-06 ENCOUNTER — Ambulatory Visit (INDEPENDENT_AMBULATORY_CARE_PROVIDER_SITE_OTHER): Payer: Managed Care, Other (non HMO) | Admitting: Obstetrics and Gynecology

## 2020-08-06 VITALS — BP 133/78 | HR 105 | Ht 68.0 in | Wt 391.0 lb

## 2020-08-06 DIAGNOSIS — N939 Abnormal uterine and vaginal bleeding, unspecified: Secondary | ICD-10-CM | POA: Diagnosis not present

## 2020-08-06 MED ORDER — NORGESTIMATE-ETH ESTRADIOL 0.25-35 MG-MCG PO TABS: 1.0000 | ORAL_TABLET | Freq: Every day | ORAL | 11 refills | Status: DC

## 2020-08-06 NOTE — Progress Notes (Signed)
35 yo P2 with BMI 39 who is here for the evaluation of persistent AUB following a trial of Balcotra. Patient reports vaginal bleeding throughout the pack since starting Balcotra. She denies any improvement in her vaginal bleeding which often last 3 weeks. She has taken Megace in conjunction as well without significant improvement. Patient is not interested in surgical interventions at this time.  Past Medical History:  Diagnosis Date   Diabetes mellitus without complication (HCC)    Herpes    PCOS (polycystic ovarian syndrome)    Vaginal Pap smear, abnormal    had colposcopy repeat pap negative   Past Surgical History:  Procedure Laterality Date   CESAREAN SECTION     CESAREAN SECTION N/A 07/24/2015   Procedure: CESAREAN SECTION;  Surgeon: Donnamae Jude, MD;  Location: [facility];  Service: Obstetrics;  Laterality: N/A;   Family History  Problem Relation Age of Onset   Hypertension Mother    Hypertension Father    Diabetes Father    Diabetes Maternal Grandmother    Hypertension Maternal Grandmother    Diabetes Maternal Grandfather    Hypertension Maternal Grandfather    Congestive Heart Failure Maternal Grandfather    Diabetes Paternal Grandmother    Hypertension Paternal Grandmother    Diabetes Paternal Grandfather    Hypertension Paternal Grandfather    Stroke Paternal Grandfather    Social History   Tobacco Use   Smoking status: Never   Smokeless tobacco: Never  Vaping Use   Vaping Use: Never used  Substance Use Topics   Alcohol use: No   Drug use: No   ROS See pertinent in HPI. All other systems reviewed and non contributory  Blood pressure 133/78, pulse (!) 105, height 5\' 8"  (1.727 m), weight (!) 391 lb (177.4 kg), last menstrual period 08/04/2020. GENERAL: Well-developed, well-nourished female in no acute distress.  NEURO: alert and oriented x 3  02/2020 ultrasound FINDINGS: Uterus   Measurements: 14.6 x 9.0 x 9.3 cm = volume: 635 mL. Anteverted  with slight retroflexion. Heterogeneous myometrium. Small submucosal leiomyoma at upper uterus 3.0 x 3.6 x 2.9 cm. Additional small intramural leiomyoma 1.9 cm diameter posterior uterus.   Endometrium   Thickness: 12 mm. Distortion/expansion of the endometrium extending into the Caesarean section scar. No mass or fluid.   Right ovary   Measurements: 3.1 x 1.6 x 2.4 cm = volume: 6.2 mL. Normal morphology without mass   Left ovary   Measurements: 2.5 x 2.6 x 2.2 cm = volume: 7.5 mL. Normal morphology without mass   Other findings   No free pelvic fluid.  No adnexal masses.   IMPRESSION: 2 small uterine leiomyomata, largest 3.6 cm diameter at upper uterus.   Caesarean section scar with distortion of the adjacent endometrial complex.   Otherwise negative exam.     Electronically Signed   By: Lavonia Dana M.D.   On: 03/23/2020 17:15  A/P 35 yo with AUB - Discussed trial of different birth control vs hysteroscopy with myosure - Patient opted to try a different birth control- Rx Sprintec provided - Patient plans to contact the office after 3 month trial for hysteroscopy with myosure if AUB does not improve with Sprintec

## 2020-08-06 NOTE — Progress Notes (Signed)
Pt is here to discuss issues with abnormal bleeding. LMP: 08/04/20. States she is bleeding through her OCPs. She has not missed any pills or taken any late. She is currently also taking megace to help control the bleeding which is not helping.

## 2020-08-15 ENCOUNTER — Encounter: Payer: Self-pay | Admitting: Nurse Practitioner

## 2020-08-17 ENCOUNTER — Other Ambulatory Visit: Payer: Self-pay | Admitting: Nurse Practitioner

## 2020-08-17 DIAGNOSIS — E119 Type 2 diabetes mellitus without complications: Secondary | ICD-10-CM

## 2020-08-28 ENCOUNTER — Encounter: Payer: Self-pay | Admitting: Nurse Practitioner

## 2020-08-28 ENCOUNTER — Other Ambulatory Visit: Payer: Self-pay | Admitting: Nurse Practitioner

## 2020-08-28 DIAGNOSIS — R41840 Attention and concentration deficit: Secondary | ICD-10-CM

## 2020-09-19 ENCOUNTER — Encounter: Payer: Self-pay | Admitting: Nurse Practitioner

## 2020-09-25 ENCOUNTER — Other Ambulatory Visit: Payer: Self-pay | Admitting: Nurse Practitioner

## 2020-09-25 DIAGNOSIS — Z6841 Body Mass Index (BMI) 40.0 and over, adult: Secondary | ICD-10-CM

## 2020-10-08 ENCOUNTER — Encounter: Payer: Self-pay | Admitting: Nurse Practitioner

## 2020-10-16 ENCOUNTER — Ambulatory Visit: Payer: Managed Care, Other (non HMO) | Admitting: Nurse Practitioner

## 2020-10-17 ENCOUNTER — Telehealth: Payer: Self-pay

## 2020-10-17 NOTE — Telephone Encounter (Signed)
I contacted the pt and rescheduled her appt that she said she missed on yesterday.

## 2020-10-22 ENCOUNTER — Other Ambulatory Visit: Payer: Self-pay

## 2020-10-22 ENCOUNTER — Ambulatory Visit: Payer: Managed Care, Other (non HMO) | Admitting: Nurse Practitioner

## 2020-10-22 ENCOUNTER — Encounter: Payer: Self-pay | Admitting: Nurse Practitioner

## 2020-10-22 VITALS — BP 118/70 | HR 92 | Temp 98.7°F | Ht 68.0 in | Wt 391.4 lb

## 2020-10-22 DIAGNOSIS — E8881 Metabolic syndrome: Secondary | ICD-10-CM

## 2020-10-22 DIAGNOSIS — G4733 Obstructive sleep apnea (adult) (pediatric): Secondary | ICD-10-CM | POA: Diagnosis not present

## 2020-10-22 DIAGNOSIS — Z6841 Body Mass Index (BMI) 40.0 and over, adult: Secondary | ICD-10-CM

## 2020-10-22 DIAGNOSIS — G245 Blepharospasm: Secondary | ICD-10-CM | POA: Diagnosis not present

## 2020-10-22 MED ORDER — OZEMPIC (1 MG/DOSE) 4 MG/3ML ~~LOC~~ SOPN
1.0000 mg | PEN_INJECTOR | SUBCUTANEOUS | 1 refills | Status: DC
Start: 2020-10-22 — End: 2021-01-24

## 2020-10-22 NOTE — Patient Instructions (Addendum)

## 2020-10-22 NOTE — Progress Notes (Signed)
I,Katawbba Wiggins,acting as a Education administrator for [employer], FNP.,have documented all relevant documentation on the behalf of Minette Brine, FNP,as directed by  Minette Brine, FNP while in the presence of Minette Brine, [place].   This visit occurred during the SARS-CoV-2 public health emergency.  Safety protocols were in place, including screening questions prior to the visit, additional usage of staff PPE, and extensive cleaning of exam room while observing appropriate contact time as indicated for disinfecting solutions.  Subjective:     Patient ID: Kristin Love , female    DOB: 1985/12/23 , 35 y.o.   MRN: [id number]   Chief Complaint  Patient presents with   Obesity    HPI  The patient is here for a folllow-up on her weight. She has been taking the Ozempic 0.5 mg weekly for the last 4 weeks. She has been more consistent over the last 4 weeks. She does eat some carbs, not drinking sodas or juices. She is rarely drinking diet sodas. She does admit her diet could be better.  She is scheduled to go to [facility] at [facility]. She is going to another GYN and is planning for a biopsy - with Dr. Everett Graff. She has a small fibroid that may be causing the increase in vaginal bleeding.  She has not been to psychiatry.     Past Medical History:  Diagnosis Date   Diabetes mellitus without complication (HCC)    Herpes    PCOS (polycystic ovarian syndrome)    Vaginal Pap smear, abnormal    had colposcopy repeat pap negative     Family History  Problem Relation Age of Onset   Hypertension Mother    Hypertension Father    Diabetes Father    Diabetes Maternal Grandmother    Hypertension Maternal Grandmother    Diabetes Maternal Grandfather    Hypertension Maternal Grandfather    Congestive Heart Failure Maternal Grandfather    Diabetes Paternal Grandmother    Hypertension Paternal Grandmother    Diabetes Paternal Grandfather    Hypertension Paternal Grandfather    Stroke Paternal  Grandfather      Current Outpatient Medications:    blood glucose meter kit and supplies KIT, Dispense based on patient and insurance preference. Check blood sugar two times a day and as needed (FOR ICD-9 250.00, 250.01)., Disp: 1 each, Rfl: 0   EPINEPHrine 0.3 mg/0.3 mL IJ SOAJ injection, Inject 0.3 mg into the muscle as needed for anaphylaxis., Disp: 1 each, Rfl: 2   megestrol (MEGACE) 40 MG tablet, Take 40 mg by mouth daily., Disp: , Rfl:    Semaglutide, 1 MG/DOSE, (OZEMPIC, 1 MG/DOSE,) 4 MG/3ML SOPN, Inject 1 mg into the skin once a week., Disp: 9 mL, Rfl: 1   valACYclovir (VALTREX) 500 MG tablet, TAKE 1 TABLET BY MOUTH DAILY. CAN INCREASE TO TWICE DAILY FOR 5 DAYS IN THE EVENT OF RECURRENCE, Disp: 30 tablet, Rfl: 12   Allergies  Allergen Reactions   Shellfish Allergy Anaphylaxis   Fish Allergy Swelling    Raw shrimp     Review of Systems  Constitutional: Negative.   Respiratory: Negative.  Negative for cough and wheezing.   Cardiovascular: Negative.  Negative for chest pain, palpitations and leg swelling.  Neurological:  Negative for dizziness and headaches.  Psychiatric/Behavioral: Negative.      Today's Vitals   10/22/20 0935  BP: 118/70  Pulse: 92  Temp: 98.7 F (37.1 C)  TempSrc: Oral  Weight: (!) 391 lb 6.4 oz (177.5  kg)  Height: '5\' 8"'  (1.727 m)   Body mass index is 59.51 kg/m.  Wt Readings from Last 3 Encounters:  10/22/20 (!) 391 lb 6.4 oz (177.5 kg)  08/06/20 (!) 391 lb (177.4 kg)  07/17/20 (!) 390 lb 3.2 oz (177 kg)    BP Readings from Last 3 Encounters:  10/22/20 118/70  08/06/20 133/78  07/17/20 128/78    Objective:  Physical Exam Vitals reviewed.  Constitutional:      General: She is not in acute distress.    Appearance: Normal appearance. She is obese.  Cardiovascular:     Rate and Rhythm: Normal rate and regular rhythm.     Pulses: Normal pulses.     Heart sounds: Normal heart sounds. No murmur heard. Pulmonary:     Effort: Pulmonary effort  is normal. No respiratory distress.     Breath sounds: Normal breath sounds. No wheezing.  Neurological:     General: No focal deficit present.     Mental Status: She is alert and oriented to person, place, and time.     Cranial Nerves: No cranial nerve deficit.     Motor: No weakness.  Psychiatric:        Mood and Affect: Mood normal.        Behavior: Behavior normal.        Thought Content: Thought content normal.        Judgment: Judgment normal.        Assessment And Plan:     1. Insulin resistance Comments: She is tolerating Ozempic well and more consistent over last 4 weeks, will increase to 1 mg weekly, has not noticed change in appetite.  2. Severe obstructive sleep apnea Comments: She is to call [facility] to inquire about the cost of the CPAP, explained the risks of cardiac events with sleep apnea.   3. Eye twitch Comments: she will try to increase her magnesium, potassium and calcium intake.  4. Class 3 severe obesity due to excess calories without serious comorbidity with body mass index (BMI) of 50.0 to 59.9 in adult [facility]) Comments: Weight is stable Encouraged to focus on healthy diet since unable to exercise often.  - Semaglutide, 1 MG/DOSE, (OZEMPIC, 1 MG/DOSE,) 4 MG/3ML SOPN; Inject 1 mg into the skin once a week.  Dispense: 9 mL; Refill: 1    Patient was given opportunity to ask questions. Patient verbalized understanding of the plan and was able to repeat key elements of the plan. All questions were answered to their satisfaction.  Minette Brine, FNP   I, Minette Brine, FNP, have reviewed all documentation for this visit. The documentation on 10/22/20 for the exam, diagnosis, procedures, and orders are all accurate and complete.   IF YOU HAVE BEEN REFERRED TO A SPECIALIST, IT MAY TAKE 1-2 WEEKS TO SCHEDULE/PROCESS THE REFERRAL. IF YOU HAVE NOT HEARD FROM US/SPECIALIST IN TWO WEEKS, PLEASE GIVE [place] A CALL AT [phone].   THE PATIENT IS ENCOURAGED TO PRACTICE  SOCIAL DISTANCING DUE TO THE COVID-19 PANDEMIC.

## 2020-11-13 ENCOUNTER — Telehealth: Payer: Self-pay | Admitting: Neurology

## 2020-11-13 NOTE — Telephone Encounter (Signed)
Called the patient back. Pt states the reason she never got set up was due to the expense of the machine and the renal fee that they charge.  I advised that unfortunately this is an insurance requirement not something that I have much control over.  Patient asked to be transferred to an alternative company just to see if there is any reduced cost.  I advised that the company that we sent the order to cover for insurance and that most likely they are all going to be the same but advised that I can certainly send the order to another company for her to compare.  I provided the patient with [facility] phone number and advised that I would send the orders over to them today.  Patient verbalized understanding.

## 2020-11-13 NOTE — Telephone Encounter (Signed)
Pt called, would like a call from the nurse to discuss getting a CPAP machine. PCP recommended to get a CPAP machine.

## 2020-11-19 ENCOUNTER — Other Ambulatory Visit: Payer: Self-pay | Admitting: General Surgery

## 2020-11-19 DIAGNOSIS — K219 Gastro-esophageal reflux disease without esophagitis: Secondary | ICD-10-CM

## 2020-11-23 ENCOUNTER — Other Ambulatory Visit: Payer: Managed Care, Other (non HMO)

## 2020-11-27 ENCOUNTER — Ambulatory Visit
Admission: RE | Admit: 2020-11-27 | Discharge: 2020-11-27 | Disposition: A | Discharge status: Home / Self Care | Payer: Managed Care, Other (non HMO) | Source: Ambulatory Visit | Attending: General Surgery | Admitting: General Surgery

## 2020-11-27 DIAGNOSIS — K219 Gastro-esophageal reflux disease without esophagitis: Secondary | ICD-10-CM

## 2020-11-28 ENCOUNTER — Other Ambulatory Visit: Payer: Self-pay | Admitting: Obstetrics and Gynecology

## 2020-12-27 ENCOUNTER — Encounter: Payer: Self-pay | Admitting: Neurology

## 2020-12-27 ENCOUNTER — Telehealth: Payer: Self-pay | Admitting: Neurology

## 2020-12-27 NOTE — Telephone Encounter (Signed)
Let message to get pt scheduled for their initial CPAP F/U. DME: [facility] P:[phone] F: [phone] Equipment issued: Air Sense 11 Auto set up on 12/03/2020 Schedule between 01/03/2021-03/03/2021

## 2020-12-31 NOTE — Telephone Encounter (Signed)
There is no available appt with any NP or the MD before the last compliance date. Please advise.

## 2021-01-24 ENCOUNTER — Other Ambulatory Visit: Payer: Self-pay

## 2021-01-24 ENCOUNTER — Encounter: Payer: Self-pay | Admitting: Nurse Practitioner

## 2021-01-24 ENCOUNTER — Ambulatory Visit: Payer: Managed Care, Other (non HMO) | Admitting: Nurse Practitioner

## 2021-01-24 VITALS — BP 124/86 | HR 92 | Temp 98.1°F | Ht 68.0 in | Wt 398.2 lb

## 2021-01-24 DIAGNOSIS — E8881 Metabolic syndrome: Secondary | ICD-10-CM

## 2021-01-24 DIAGNOSIS — E119 Type 2 diabetes mellitus without complications: Secondary | ICD-10-CM

## 2021-01-24 DIAGNOSIS — Z6841 Body Mass Index (BMI) 40.0 and over, adult: Secondary | ICD-10-CM

## 2021-01-24 DIAGNOSIS — E1169 Type 2 diabetes mellitus with other specified complication: Secondary | ICD-10-CM

## 2021-01-24 MED ORDER — MOUNJARO 5 MG/0.5ML ~~LOC~~ SOAJ
5.0000 mg | SUBCUTANEOUS | 0 refills | Status: DC
Start: 2021-01-24 — End: 2021-03-11

## 2021-01-24 MED ORDER — MOUNJARO 5 MG/0.5ML ~~LOC~~ SOAJ: 5.0000 mg | SUBCUTANEOUS | 0 refills | Status: DC

## 2021-01-24 NOTE — Progress Notes (Addendum)
I,Yamilka J Llittleton,acting as a Education administrator for [employer], FNP.,have documented all relevant documentation on the behalf of Minette Brine, FNP,as directed by  Minette Brine, FNP while in the presence of Minette Brine, [place].   This visit occurred during the SARS-CoV-2 public health emergency.  Safety protocols were in place, including screening questions prior to the visit, additional usage of staff PPE, and extensive cleaning of exam room while observing appropriate contact time as indicated for disinfecting solutions.  Subjective:     Patient ID: Kristin Love , female    DOB: 10-14-1985 , 35 y.o.   MRN: [id number]   Chief Complaint  Patient presents with   Weight Check   Diabetes    HPI  Patient presents today for a prediabetes and weight check. She stated she feels like the ozempic is not working it does not curve her appetite.she has been taking the medication as directed.  She is not taking megace regularly for her menstrual cycle. She is seeing a new GYN; she may be having DNC. She may need to be treated for possible endometriosis.  She has been to [facility] and is to do gastric sleeve. She does realize she has not been eating right in between. She is now on CPAP machine for 1.5 months but does not feel it is working.   Wt Readings from Last 3 Encounters: 01/24/21 : (!) 398 lb 3.2 oz (180.6 kg) 10/22/20 : (!) 391 lb 6.4 oz (177.5 kg) 08/06/20 : (!) 391 lb (177.4 kg)      Past Medical History:  Diagnosis Date   Diabetes mellitus without complication (HCC)    Herpes    PCOS (polycystic ovarian syndrome)    Vaginal Pap smear, abnormal    had colposcopy repeat pap negative     Family History  Problem Relation Age of Onset   Hypertension Mother    Hypertension Father    Diabetes Father    Diabetes Maternal Grandmother    Hypertension Maternal Grandmother    Diabetes Maternal Grandfather    Hypertension Maternal Grandfather    Congestive Heart Failure  Maternal Grandfather    Diabetes Paternal Grandmother    Hypertension Paternal Grandmother    Diabetes Paternal Grandfather    Hypertension Paternal Grandfather    Stroke Paternal Grandfather      Current Outpatient Medications:    blood glucose meter kit and supplies KIT, Dispense based on patient and insurance preference. Check blood sugar two times a day and as needed (FOR ICD-9 250.00, 250.01)., Disp: 1 each, Rfl: 0   EPINEPHrine 0.3 mg/0.3 mL IJ SOAJ injection, Inject 0.3 mg into the muscle as needed for anaphylaxis., Disp: 1 each, Rfl: 2   megestrol (MEGACE) 40 MG tablet, Take 40 mg by mouth daily., Disp: , Rfl:    valACYclovir (VALTREX) 500 MG tablet, TAKE 1 TABLET BY MOUTH DAILY. CAN INCREASE TO TWICE DAILY FOR 5 DAYS IN THE EVENT OF RECURRENCE, Disp: 30 tablet, Rfl: 12   tirzepatide (MOUNJARO) 5 MG/0.5ML Pen, Inject 5 mg into the skin once a week., Disp: 6 mL, Rfl: 0   Allergies  Allergen Reactions   Shellfish Allergy Anaphylaxis   Fish Allergy Swelling    Raw shrimp     Review of Systems   Today's Vitals   01/24/21 1623  BP: 124/86  Pulse: 92  Temp: 98.1 F (36.7 C)  Weight: (!) 398 lb 3.2 oz (180.6 kg)  Height: _0  (1.727 m)  PainSc: 0-No pain  Body mass index is 60.55 kg/m.   Objective:  Physical Exam Vitals reviewed.  Constitutional:      General: She is not in acute distress.    Appearance: Normal appearance. She is obese.  Cardiovascular:     Rate and Rhythm: Normal rate and regular rhythm.     Pulses: Normal pulses.     Heart sounds: Normal heart sounds. No murmur heard. Pulmonary:     Effort: Pulmonary effort is normal. No respiratory distress.     Breath sounds: Normal breath sounds. No wheezing.  Skin:    Capillary Refill: Capillary refill takes less than 2 seconds.  Neurological:     General: No focal deficit present.     Mental Status: She is alert and oriented to person, place, and time.     Cranial Nerves: No cranial nerve deficit.      Motor: No weakness.  Psychiatric:        Mood and Affect: Mood normal.        Behavior: Behavior normal.        Thought Content: Thought content normal.        Judgment: Judgment normal.        Assessment And Plan:     1. Type 2 diabetes mellitus without complication, without long-term current use of insulin (HCC) Comments: Her HgbA1c is now in diabetes range and will try her on Mounjaro, discusssed side effects. Sample given in office with directions - tirzepatide [facility]) 5 MG/0.5ML Pen; Inject 5 mg into the skin once a week.  Dispense: 6 mL; Refill: 0  2. Insulin resistance Comments: encouraged to increase physical activity and to avoid high carbohydrate foods  3. Class 3 severe obesity due to excess calories without serious comorbidity with body mass index (BMI) of 60.0 to 69.9 in adult [facility]) Chronic Discussed healthy diet and regular exercise options  Encouraged to exercise at least 150 minutes per week with 2 days of strength training     Patient was given opportunity to ask questions. Patient verbalized understanding of the plan and was able to repeat key elements of the plan. All questions were answered to their satisfaction.  Minette Brine, FNP   I, Minette Brine, FNP, have reviewed all documentation for this visit. The documentation on 01/24/21 for the exam, diagnosis, procedures, and orders are all accurate and complete.   IF YOU HAVE BEEN REFERRED TO A SPECIALIST, IT MAY TAKE 1-2 WEEKS TO SCHEDULE/PROCESS THE REFERRAL. IF YOU HAVE NOT HEARD FROM US/SPECIALIST IN TWO WEEKS, PLEASE GIVE [place] A CALL AT [phone].   THE PATIENT IS ENCOURAGED TO PRACTICE SOCIAL DISTANCING DUE TO THE COVID-19 PANDEMIC.

## 2021-01-24 NOTE — Patient Instructions (Addendum)

## 2021-03-05 ENCOUNTER — Ambulatory Visit: Payer: Managed Care, Other (non HMO) | Admitting: Nurse Practitioner

## 2021-03-11 ENCOUNTER — Other Ambulatory Visit: Payer: Self-pay

## 2021-03-11 ENCOUNTER — Encounter: Payer: Self-pay | Admitting: Nurse Practitioner

## 2021-03-11 DIAGNOSIS — E119 Type 2 diabetes mellitus without complications: Secondary | ICD-10-CM

## 2021-03-11 MED ORDER — MOUNJARO 5 MG/0.5ML ~~LOC~~ SOAJ: 5.0000 mg | SUBCUTANEOUS | 0 refills | Status: DC

## 2021-03-13 ENCOUNTER — Encounter: Payer: Self-pay | Admitting: Adult Health

## 2021-03-13 ENCOUNTER — Ambulatory Visit: Payer: Managed Care, Other (non HMO) | Admitting: Adult Health

## 2021-03-13 ENCOUNTER — Other Ambulatory Visit: Payer: Self-pay

## 2021-03-13 VITALS — BP 131/69 | HR 61 | Ht 68.0 in | Wt >= 6400 oz

## 2021-03-13 DIAGNOSIS — G4733 Obstructive sleep apnea (adult) (pediatric): Secondary | ICD-10-CM | POA: Diagnosis not present

## 2021-03-13 DIAGNOSIS — Z9989 Dependence on other enabling machines and devices: Secondary | ICD-10-CM | POA: Diagnosis not present

## 2021-03-13 NOTE — Progress Notes (Signed)
PATIENT: Kristin Love DOB: 05/28/1985  REASON FOR VISIT: follow up HISTORY FROM: patient PRIMARY NEUROLOGIST: Dr. Brett Fairy  HISTORY OF PRESENT ILLNESS: Today 03/13/21:  [name] is a 36 year old female with a history of obstructive sleep apnea on CPAP.  She returns today for follow-up.  She states that she was using the CPAP but would wake up and she had taken it off unknowingly during the night.  She reports eventually she did stop using it.  She is planning on having weight loss surgery specifically the gastric sleeve in April.  She returns today for an evaluation.    REVIEW OF SYSTEMS: Out of a complete 14 system review of symptoms, the patient complains only of the following symptoms, and all other reviewed systems are negative.   ESS 3  ALLERGIES: Allergies  Allergen Reactions   Shellfish Allergy Anaphylaxis   Fish Allergy Swelling    Raw shrimp    HOME MEDICATIONS: Outpatient Medications Prior to Visit  Medication Sig Dispense Refill   blood glucose meter kit and supplies KIT Dispense based on patient and insurance preference. Check blood sugar two times a day and as needed (FOR ICD-9 250.00, 250.01). 1 each 0   EPINEPHrine 0.3 mg/0.3 mL IJ SOAJ injection Inject 0.3 mg into the muscle as needed for anaphylaxis. 1 each 2   MYFEMBREE 40-1-0.5 MG TABS Take 1 tablet by mouth daily.     tirzepatide [facility]) 5 MG/0.5ML Pen Inject 5 mg into the skin once a week. 6 mL 0   valACYclovir (VALTREX) 500 MG tablet TAKE 1 TABLET BY MOUTH DAILY. CAN INCREASE TO TWICE DAILY FOR 5 DAYS IN THE EVENT OF RECURRENCE 30 tablet 12   megestrol (MEGACE) 40 MG tablet Take 40 mg by mouth daily. (Patient not taking: Reported on 03/13/2021)     No facility-administered medications prior to visit.    PAST MEDICAL HISTORY: Past Medical History:  Diagnosis Date   Diabetes mellitus without complication (HCC)    Herpes    PCOS (polycystic ovarian syndrome)    Vaginal Pap smear, abnormal     had colposcopy repeat pap negative    PAST SURGICAL HISTORY: Past Surgical History:  Procedure Laterality Date   CESAREAN SECTION     CESAREAN SECTION N/A 07/24/2015   Procedure: CESAREAN SECTION;  Surgeon: Donnamae Jude, MD;  Location: [facility];  Service: Obstetrics;  Laterality: N/A;    FAMILY HISTORY: Family History  Problem Relation Age of Onset   Hypertension Mother    Hypertension Father    Diabetes Father    Diabetes Maternal Grandmother    Hypertension Maternal Grandmother    Diabetes Maternal Grandfather    Hypertension Maternal Grandfather    Congestive Heart Failure Maternal Grandfather    Diabetes Paternal Grandmother    Hypertension Paternal Grandmother    Diabetes Paternal Grandfather    Hypertension Paternal Grandfather    Stroke Paternal Grandfather     SOCIAL HISTORY: Social History   Socioeconomic History   Marital status: Single    Spouse name: Not on file   Number of children: Not on file   Years of education: Not on file   Highest education level: Not on file  Occupational History   Not on file  Tobacco Use   Smoking status: Never   Smokeless tobacco: Never  Vaping Use   Vaping Use: Never used  Substance and Sexual Activity   Alcohol use: No   Drug use: No   Sexual activity:  Yes    Partners: Male    Birth control/protection: None  Other Topics Concern   Not on file  Social History Narrative   Lives with her children   Caffeine:  1 cup coffee/day, diet soda 1-2/day   Social Determinants of Health   Financial Resource Strain: Not on file  Food Insecurity: Not on file  Transportation Needs: Not on file  Physical Activity: Not on file  Stress: Not on file  Social Connections: Not on file  Intimate Partner Violence: Not on file      PHYSICAL EXAM  Vitals:   03/13/21 1350  BP: 131/69  Pulse: 61  Weight: (!) 403 lb (182.8 kg)  Height: '5\' 8"'  (1.727 m)   Body mass index is 61.28 kg/m.  Generalized: Well  developed, in no acute distress  Chest: Lungs clear to auscultation bilaterally  Neurological examination  Mentation: Alert oriented to time, place, history taking. Follows all commands speech and language fluent Cranial nerve II-XII: Extraocular movements were full, visual field were full on confrontational test Head turning and shoulder shrug  were normal and symmetric. Motor: The motor testing reveals 5 over 5 strength of all 4 extremities. Good symmetric motor tone is noted throughout.  Sensory: Sensory testing is intact to soft touch on all 4 extremities. No evidence of extinction is noted.  Gait and station: Gait is normal.    DIAGNOSTIC DATA (LABS, IMAGING, TESTING) - I reviewed patient records, labs, notes, testing and imaging myself where available.  Lab Results  Component Value Date   WBC 10.6 (H) 04/17/2020   HGB 12.9 04/17/2020   HCT 38.8 04/17/2020   MCV 86.0 04/17/2020   PLT 339 04/17/2020      Component Value Date/Time   NA 141 05/01/2020 1252   K 4.5 05/01/2020 1252   CL 104 05/01/2020 1252   CO2 20 05/01/2020 1252   GLUCOSE 88 05/01/2020 1252   GLUCOSE 105 (H) 04/17/2020 1242   BUN 10 05/01/2020 1252   CREATININE 0.64 05/01/2020 1252   CALCIUM 9.0 05/01/2020 1252   PROT 7.1 05/01/2020 1252   ALBUMIN 3.9 05/01/2020 1252   AST 12 05/01/2020 1252   ALT 12 05/01/2020 1252   ALKPHOS 66 05/01/2020 1252   BILITOT 0.3 05/01/2020 1252   GFRNONAA >60 04/17/2020 1242   GFRAA 134 02/01/2020 1705   Lab Results  Component Value Date   CHOL 175 05/01/2020   HDL 43 05/01/2020   LDLCALC 119 (H) 05/01/2020   TRIG 71 05/01/2020   CHOLHDL 4.1 05/01/2020   Lab Results  Component Value Date   HGBA1C 6.2 (H) 05/01/2020   No results found for: VITAMINB12 Lab Results  Component Value Date   TSH 1.490 07/17/2020      ASSESSMENT AND PLAN 36 y.o. year old female  has a past medical history of Diabetes mellitus without complication ([place]), Herpes, PCOS (polycystic  ovarian syndrome), and Vaginal Pap smear, abnormal. here with:  OSA on CPAP  - CPAP compliance suboptimal - Good treatment of AHI when using the machine -Patient will restart using CPAP.  Encouraged her to stay consistent with her usage. - F/U in 1 year or sooner if needed    Ward Givens, MSN, NP-C 03/13/2021, 2:05 PM [facility] [address], [place], [place] [address] [phone]

## 2021-03-13 NOTE — Patient Instructions (Signed)
Continue using CPAP nightly and greater than 4 hours each night °If your symptoms worsen or you develop new symptoms please let us know.  ° °

## 2021-03-14 ENCOUNTER — Encounter: Payer: Self-pay | Admitting: Nurse Practitioner

## 2021-03-14 ENCOUNTER — Ambulatory Visit: Payer: Managed Care, Other (non HMO) | Admitting: Nurse Practitioner

## 2021-03-14 ENCOUNTER — Ambulatory Visit (INDEPENDENT_AMBULATORY_CARE_PROVIDER_SITE_OTHER): Payer: Managed Care, Other (non HMO) | Admitting: Nurse Practitioner

## 2021-03-14 VITALS — BP 132/70 | HR 96 | Temp 98.1°F | Ht 68.0 in | Wt >= 6400 oz

## 2021-03-14 DIAGNOSIS — R6889 Other general symptoms and signs: Secondary | ICD-10-CM

## 2021-03-14 DIAGNOSIS — Z6841 Body Mass Index (BMI) 40.0 and over, adult: Secondary | ICD-10-CM

## 2021-03-14 DIAGNOSIS — E669 Obesity, unspecified: Secondary | ICD-10-CM

## 2021-03-14 DIAGNOSIS — E1169 Type 2 diabetes mellitus with other specified complication: Secondary | ICD-10-CM

## 2021-03-14 DIAGNOSIS — N939 Abnormal uterine and vaginal bleeding, unspecified: Secondary | ICD-10-CM | POA: Diagnosis not present

## 2021-03-14 LAB — POCT URINALYSIS DIPSTICK
Bilirubin, UA: NEGATIVE
Glucose, UA: NEGATIVE
Ketones, UA: NEGATIVE
Leukocytes, UA: NEGATIVE
Nitrite, UA: NEGATIVE
Protein, UA: NEGATIVE
Spec Grav, UA: 1.025 (ref 1.010–1.025)
Urobilinogen, UA: 0.2 E.U./dL
pH, UA: 6 (ref 5.0–8.0)

## 2021-03-14 NOTE — Progress Notes (Signed)
I,Tianna Badgett,acting as a Education administrator for [employer], FNP.,have documented all relevant documentation on the behalf of Minette Brine, FNP,as directed by  Minette Brine, FNP while in the presence of Minette Brine, [place].  This visit occurred during the SARS-CoV-2 public health emergency.  Safety protocols were in place, including screening questions prior to the visit, additional usage of staff PPE, and extensive cleaning of exam room while observing appropriate contact time as indicated for disinfecting solutions.  Subjective:     Patient ID: Kristin Love , female    DOB: February 12, 1985 , 36 y.o.   MRN: [id number]   Chief Complaint  Patient presents with   Diabetes    HPI  Patient is here for diabetes follow up. She is currently taking mounjaro but she missed her dose this week due to supply issues. She is scheduled to have gastric sleeve on April 25th with Dr. Cathlean Sauer. On April 4th she is to start a liver shrinking diet. She had been on Monjauro but has not checked to see if the rx is at the pharmacy  Wt Readings from Last 3 Encounters: 03/14/21 : (!) 402 lb 3.2 oz (182.4 kg) 03/13/21 : (!) 403 lb (182.8 kg) 01/24/21 : (!) 398 lb 3.2 oz (180.6 kg)  She is planning to start a more strict diet this week and is meal prepping.  She is currently taking nifembri for her bleeding.   Diabetes She presents for her follow-up diabetic visit. Diabetes type: prediabetes. There are no hypoglycemic associated symptoms. There are no diabetic associated symptoms.    Past Medical History:  Diagnosis Date   Diabetes mellitus without complication (HCC)    Herpes    PCOS (polycystic ovarian syndrome)    Vaginal Pap smear, abnormal    had colposcopy repeat pap negative     Family History  Problem Relation Age of Onset   Hypertension Mother    Hypertension Father    Diabetes Father    Diabetes Maternal Grandmother    Hypertension Maternal Grandmother    Diabetes Maternal Grandfather     Hypertension Maternal Grandfather    Congestive Heart Failure Maternal Grandfather    Diabetes Paternal Grandmother    Hypertension Paternal Grandmother    Diabetes Paternal Grandfather    Hypertension Paternal Grandfather    Stroke Paternal Grandfather      Current Outpatient Medications:    blood glucose meter kit and supplies KIT, Dispense based on patient and insurance preference. Check blood sugar two times a day and as needed (FOR ICD-9 250.00, 250.01)., Disp: 1 each, Rfl: 0   EPINEPHrine 0.3 mg/0.3 mL IJ SOAJ injection, Inject 0.3 mg into the muscle as needed for anaphylaxis., Disp: 1 each, Rfl: 2   MYFEMBREE 40-1-0.5 MG TABS, Take 1 tablet by mouth daily., Disp: , Rfl:    tirzepatide (MOUNJARO) 5 MG/0.5ML Pen, Inject 5 mg into the skin once a week., Disp: 6 mL, Rfl: 0   valACYclovir (VALTREX) 500 MG tablet, TAKE 1 TABLET BY MOUTH DAILY. CAN INCREASE TO TWICE DAILY FOR 5 DAYS IN THE EVENT OF RECURRENCE, Disp: 30 tablet, Rfl: 12   Allergies  Allergen Reactions   Shellfish Allergy Anaphylaxis   Fish Allergy Swelling    Raw shrimp     Review of Systems  Constitutional: Negative.   Respiratory: Negative.    Cardiovascular: Negative.   Gastrointestinal: Negative.   Endocrine: Positive for heat intolerance.  Neurological: Negative.   Psychiatric/Behavioral: Negative.      Today's Vitals  03/14/21 0826  BP: 132/70  Pulse: 96  Temp: 98.1 F (36.7 C)  TempSrc: Oral  Weight: (!) 402 lb 3.2 oz (182.4 kg)  Height: '5\' 8"'  (1.727 m)   Body mass index is 61.15 kg/m.  Wt Readings from Last 3 Encounters:  03/14/21 (!) 402 lb 3.2 oz (182.4 kg)  03/13/21 (!) 403 lb (182.8 kg)  01/24/21 (!) 398 lb 3.2 oz (180.6 kg)    Objective:  Physical Exam Vitals reviewed.  Constitutional:      General: She is not in acute distress.    Appearance: Normal appearance. She is obese.  Cardiovascular:     Rate and Rhythm: Normal rate and regular rhythm.     Pulses: Normal pulses.      Heart sounds: Normal heart sounds. No murmur heard. Pulmonary:     Effort: Pulmonary effort is normal. No respiratory distress.     Breath sounds: Normal breath sounds. No wheezing.  Skin:    General: Skin is warm and dry.     Capillary Refill: Capillary refill takes less than 2 seconds.  Neurological:     General: No focal deficit present.     Mental Status: She is alert and oriented to person, place, and time.     Cranial Nerves: No cranial nerve deficit.     Motor: No weakness.  Psychiatric:        Mood and Affect: Mood normal.        Behavior: Behavior normal.        Thought Content: Thought content normal.        Judgment: Judgment normal.        Assessment And Plan:     1. Diabetes mellitus type 2 in obese [facility]) Comments: HgbA1c improved at last visit, she is to check for her Monjouro at the pharmacy.  - Hemoglobin A1c - POCT Urinalysis Dipstick (81002) - Microalbumin / Creatinine Urine Ratio - BMP8+EGFR  2. Abnormal uterine bleeding (AUB) Comments: Continue follow up with GYN, will also check her TSH - TSH - VITAMIN D 25 Hydroxy (Vit-D Deficiency, Fractures)  3. Heat intolerance Comments: Will check for metabolic cause, also discussed importance of limiting intake of sugars and carbohydrates. - TSH  4. Class 3 severe obesity due to excess calories without serious comorbidity with body mass index (BMI) of 60.0 to 69.9 in adult [facility]) Comments: Will have her gastric sleeve surgery in April, encouraged to continue to focus on healthy diet and regular exercise  She is encouraged to strive for BMI less than 30 to decrease cardiac risk. Advised to aim for at least 150 minutes of exercise per week.    Patient was given opportunity to ask questions. Patient verbalized understanding of the plan and was able to repeat key elements of the plan. All questions were answered to their satisfaction.  Minette Brine, FNP   I, Minette Brine, FNP, have reviewed all documentation for  this visit. The documentation on 03/14/21 for the exam, diagnosis, procedures, and orders are all accurate and complete.   IF YOU HAVE BEEN REFERRED TO A SPECIALIST, IT MAY TAKE 1-2 WEEKS TO SCHEDULE/PROCESS THE REFERRAL. IF YOU HAVE NOT HEARD FROM US/SPECIALIST IN TWO WEEKS, PLEASE GIVE [place] A CALL AT [phone].   THE PATIENT IS ENCOURAGED TO PRACTICE SOCIAL DISTANCING DUE TO THE COVID-19 PANDEMIC.

## 2021-03-14 NOTE — Patient Instructions (Signed)

## 2021-03-15 LAB — BMP8+EGFR
BUN/Creatinine Ratio: 17 (ref 9–23)
BUN: 11 mg/dL (ref 6–20)
CO2: 24 mmol/L (ref 20–29)
Calcium: 8.8 mg/dL (ref 8.7–10.2)
Chloride: 104 mmol/L (ref 96–106)
Creatinine, Ser: 0.66 mg/dL (ref 0.57–1.00)
Glucose: 102 mg/dL — ABNORMAL HIGH (ref 70–99)
Potassium: 4.5 mmol/L (ref 3.5–5.2)
Sodium: 140 mmol/L (ref 134–144)
eGFR: 118 mL/min/{1.73_m2} (ref >59)

## 2021-03-15 LAB — MICROALBUMIN / CREATININE URINE RATIO
Creatinine, Urine: 204.4 mg/dL
Microalb/Creat Ratio: 10 mg/g creat (ref 0–29)
Microalbumin, Urine: 19.9 ug/mL

## 2021-03-15 LAB — TSH: TSH: 1.75 u[IU]/mL (ref 0.450–4.500)

## 2021-03-15 LAB — VITAMIN D 25 HYDROXY (VIT D DEFICIENCY, FRACTURES): Vit D, 25-Hydroxy: 11.5 ng/mL — ABNORMAL LOW (ref 30.0–100.0)

## 2021-03-15 LAB — HEMOGLOBIN A1C
Est. average glucose Bld gHb Est-mCnc: 131 mg/dL
Hgb A1c MFr Bld: 6.2 % — ABNORMAL HIGH (ref 4.8–5.6)

## 2021-03-19 ENCOUNTER — Other Ambulatory Visit: Payer: Self-pay | Admitting: Nurse Practitioner

## 2021-03-19 DIAGNOSIS — E559 Vitamin D deficiency, unspecified: Secondary | ICD-10-CM

## 2021-03-19 MED ORDER — VITAMIN D (ERGOCALCIFEROL) 1.25 MG (50000 UNIT) PO CAPS: 50000.0000 [IU] | ORAL_CAPSULE | ORAL | 1 refills | Status: DC

## 2021-05-06 DIAGNOSIS — E119 Type 2 diabetes mellitus without complications: Secondary | ICD-10-CM | POA: Insufficient documentation

## 2021-05-15 ENCOUNTER — Encounter: Payer: Self-pay | Admitting: Nurse Practitioner

## 2021-08-19 ENCOUNTER — Encounter: Payer: Self-pay | Admitting: Nurse Practitioner

## 2021-08-19 ENCOUNTER — Ambulatory Visit (INDEPENDENT_AMBULATORY_CARE_PROVIDER_SITE_OTHER): Payer: Managed Care, Other (non HMO) | Admitting: Nurse Practitioner

## 2021-08-19 VITALS — BP 136/68 | HR 87 | Temp 98.2°F | Ht 68.0 in | Wt 328.0 lb

## 2021-08-19 DIAGNOSIS — G4733 Obstructive sleep apnea (adult) (pediatric): Secondary | ICD-10-CM | POA: Diagnosis not present

## 2021-08-19 DIAGNOSIS — Z0000 Encounter for general adult medical examination without abnormal findings: Secondary | ICD-10-CM

## 2021-08-19 DIAGNOSIS — E1169 Type 2 diabetes mellitus with other specified complication: Secondary | ICD-10-CM | POA: Diagnosis not present

## 2021-08-19 DIAGNOSIS — H6121 Impacted cerumen, right ear: Secondary | ICD-10-CM | POA: Diagnosis not present

## 2021-08-19 DIAGNOSIS — Z5320 Procedure and treatment not carried out because of patient's decision for unspecified reasons: Secondary | ICD-10-CM

## 2021-08-19 DIAGNOSIS — Z6841 Body Mass Index (BMI) 40.0 and over, adult: Secondary | ICD-10-CM

## 2021-08-19 DIAGNOSIS — Z0001 Encounter for general adult medical examination with abnormal findings: Secondary | ICD-10-CM

## 2021-08-19 DIAGNOSIS — E559 Vitamin D deficiency, unspecified: Secondary | ICD-10-CM

## 2021-08-19 LAB — POCT URINALYSIS DIPSTICK
Blood, UA: NEGATIVE
Glucose, UA: NEGATIVE
Ketones, UA: 40
Leukocytes, UA: NEGATIVE
Nitrite, UA: NEGATIVE
Protein, UA: POSITIVE — AB
Spec Grav, UA: 1.025 (ref 1.010–1.025)
Urobilinogen, UA: 1 E.U./dL
pH, UA: 6.5 (ref 5.0–8.0)

## 2021-08-19 NOTE — Patient Instructions (Signed)

## 2021-08-19 NOTE — Progress Notes (Signed)
Barnet Glasgow Martin,acting as a Education administrator for Minette Brine, FNP.,have documented all relevant documentation on the behalf of Minette Brine, FNP,as directed by  Minette Brine, FNP while in the presence of Minette Brine, [place].   Subjective:     Patient ID: Kristin Love , female    DOB: Aug 02, 1985 , 36 y.o.   MRN: [id number]   Chief Complaint  Patient presents with   Annual Exam    HPI  Patient is here today for HM and wants more information on one of her medications. She had gastric sleeve in April 2023 and has done well. She is due to see the bariatric clinic in August for follow. She is walking at least 2-3 days a week and she walks at work. She has her GYN care done with Dr. Everett Graff   Patient states she is having a little anxiety and she is stressed.  BP Readings from Last 3 Encounters: 08/19/21 : 136/68 03/14/21 : 132/70 03/13/21 : 131/69   Wt Readings from Last 3 Encounters: 08/19/21 : (!) 328 lb (148.8 kg) 03/14/21 : (!) 402 lb 3.2 oz (182.4 kg) 03/13/21 : (!) 403 lb (182.8 kg)       Past Medical History:  Diagnosis Date   Diabetes mellitus without complication (HCC)    Herpes    PCOS (polycystic ovarian syndrome)    Vaginal Pap smear, abnormal    had colposcopy repeat pap negative     Family History  Problem Relation Age of Onset   Hypertension Mother    Hypertension Father    Diabetes Father    Diabetes Maternal Grandmother    Hypertension Maternal Grandmother    Diabetes Maternal Grandfather    Hypertension Maternal Grandfather    Congestive Heart Failure Maternal Grandfather    Diabetes Paternal Grandmother    Hypertension Paternal Grandmother    Diabetes Paternal Grandfather    Hypertension Paternal Grandfather    Stroke Paternal Grandfather      Current Outpatient Medications:    blood glucose meter kit and supplies KIT, Dispense based on patient and insurance preference. Check blood sugar two times a day and as needed (FOR ICD-9 250.00, 250.01).,  Disp: 1 each, Rfl: 0   EPINEPHrine 0.3 mg/0.3 mL IJ SOAJ injection, Inject 0.3 mg into the muscle as needed for anaphylaxis., Disp: 1 each, Rfl: 2   valACYclovir (VALTREX) 500 MG tablet, TAKE 1 TABLET BY MOUTH DAILY. CAN INCREASE TO TWICE DAILY FOR 5 DAYS IN THE EVENT OF RECURRENCE, Disp: 30 tablet, Rfl: 12   MYFEMBREE 40-1-0.5 MG TABS, Take 1 tablet by mouth daily. (Patient not taking: Reported on 08/19/2021), Disp: , Rfl:    tirzepatide (MOUNJARO) 5 MG/0.5ML Pen, Inject 5 mg into the skin once a week. (Patient not taking: Reported on 08/19/2021), Disp: 6 mL, Rfl: 0   Vitamin D, Ergocalciferol, (DRISDOL) 1.25 MG (50000 UNIT) CAPS capsule, Take 1 capsule (50,000 Units total) by mouth 2 (two) times a week. (Patient not taking: Reported on 08/19/2021), Disp: 24 capsule, Rfl: 1   Allergies  Allergen Reactions   Shellfish Allergy Anaphylaxis   Fish Allergy Swelling    Raw shrimp      The patient states she uses IUD for birth control.  No LMP recorded (lmp unknown). (Menstrual status: Irregular Periods). Negative for Dysmenorrhea and Negative for Menorrhagia. Negative for: breast discharge, breast lump(s), breast pain and breast self exam. Associated symptoms include abnormal vaginal bleeding. Pertinent negatives include abnormal bleeding (hematology), anxiety, decreased libido, depression, difficulty  falling sleep, dyspareunia, history of infertility, nocturia, sexual dysfunction, sleep disturbances, urinary incontinence, urinary urgency, vaginal discharge and vaginal itching. Diet regular; her portions have decreased. She is avoiding sodas. The patient states her exercise level is minimal.   The patient's tobacco use is:  Social History   Tobacco Use  Smoking Status Never  Smokeless Tobacco Never   She has been exposed to passive smoke. The patient's alcohol use is:  Social History   Substance and Sexual Activity  Alcohol Use No   Additional information: Last pap 03/21/2019 reports she had  one since then.   Review of Systems  Constitutional: Negative.   HENT: Negative.    Eyes: Negative.   Respiratory: Negative.    Cardiovascular: Negative.   Gastrointestinal: Negative.   Endocrine: Negative.   Genitourinary: Negative.   Musculoskeletal: Negative.   Skin: Negative.   Allergic/Immunologic: Negative.   Neurological: Negative.   Hematological: Negative.   Psychiatric/Behavioral: Negative.       Today's Vitals   08/19/21 1513  BP: 136/68  Pulse: 87  Temp: 98.2 F (36.8 C)  TempSrc: Oral  Weight: (!) 328 lb (148.8 kg)  Height: _0  (1.727 m)   Body mass index is 49.87 kg/m.  Wt Readings from Last 3 Encounters:  08/19/21 (!) 328 lb (148.8 kg)  03/14/21 (!) 402 lb 3.2 oz (182.4 kg)  03/13/21 (!) 403 lb (182.8 kg)    Objective:  Physical Exam Vitals reviewed.  Constitutional:      General: She is not in acute distress.    Appearance: Normal appearance. She is well-developed. She is obese.  HENT:     Head: Normocephalic and atraumatic.     Right Ear: Hearing, tympanic membrane, ear canal and external ear normal. There is no impacted cerumen.     Left Ear: Hearing, tympanic membrane, ear canal and external ear normal. There is impacted cerumen.     Nose: Nose normal.     Mouth/Throat:     Mouth: Mucous membranes are moist.  Eyes:     General: Lids are normal.     Extraocular Movements: Extraocular movements intact.     Conjunctiva/sclera: Conjunctivae normal.     Pupils: Pupils are equal, round, and reactive to light.     Funduscopic exam:    Right eye: No papilledema.        Left eye: No papilledema.  Neck:     Thyroid: No thyroid mass.     Vascular: No carotid bruit.  Cardiovascular:     Rate and Rhythm: Normal rate and regular rhythm.     Pulses: Normal pulses.     Heart sounds: Normal heart sounds. No murmur heard. Pulmonary:     Effort: Pulmonary effort is normal. No respiratory distress.     Breath sounds: Normal breath sounds. No  wheezing.  Chest:     Chest wall: No mass.  Breasts:    Tanner Score is 5.     Right: Normal. No mass or tenderness.     Left: Normal. No mass or tenderness.  Abdominal:     General: Abdomen is flat. Bowel sounds are normal. There is no distension.     Palpations: Abdomen is soft.     Tenderness: There is no abdominal tenderness.  Genitourinary:    Comments: Followed by GYN Musculoskeletal:        General: No swelling. Normal range of motion.     Cervical back: Full passive range of motion without pain, normal range of  motion and neck supple.     Right lower leg: No edema.     Left lower leg: No edema.  Lymphadenopathy:     Upper Body:     Right upper body: No supraclavicular, axillary or pectoral adenopathy.     Left upper body: No supraclavicular, axillary or pectoral adenopathy.  Skin:    General: Skin is warm and dry.     Capillary Refill: Capillary refill takes less than 2 seconds.  Neurological:     General: No focal deficit present.     Mental Status: She is alert and oriented to person, place, and time.     Cranial Nerves: No cranial nerve deficit.     Sensory: No sensory deficit.     Motor: No weakness.  Psychiatric:        Mood and Affect: Mood normal.        Behavior: Behavior normal.        Thought Content: Thought content normal.        Judgment: Judgment normal.         Assessment And Plan:     1. Annual physical exam Behavior modifications discussed and diet history reviewed.   Pt will continue to exercise regularly and modify diet with low GI, plant based foods and decrease intake of processed foods.  Recommend intake of daily multivitamin, Vitamin D, and calcium.  Recommend for preventive screenings, as well as recommend immunizations that include influenza, TDAP (up to date) - CBC no Diff  2. Diabetes mellitus type 2 in obese [facility]) Comments: She is not taking Mounjaro at this time, will check HgbA1c. Declined having an EKG.  - CMP14+EGFR -  Hemoglobin A1c - Lipid panel - Microalbumin / Creatinine Urine Ratio - POCT Urinalysis Dipstick (81002)  3. Vitamin D deficiency Will check vitamin D level and supplement as needed.    Also encouraged to spend 15 minutes in the sun daily.  - Vitamin D (25 hydroxy)  4. Severe obstructive sleep apnea Comments: She is having difficulty with wearing her CPAP and is to follow up with the sleep provider.   5. Impacted cerumen of right ear Comments: Water lavage done with success - Ear Lavage  6. Treatment declined by patient Comments: She does not want to have an EKG, discussed risk of heart disease with diabetes and obesity  7. Morbid obesity with BMI of 45.0-49.9, adult ([place]) She is encouraged to strive for BMI less than 30 to decrease cardiac risk. Advised to aim for at least 150 minutes of exercise per week.     Patient was given opportunity to ask questions. Patient verbalized understanding of the plan and was able to repeat key elements of the plan. All questions were answered to their satisfaction.   Minette Brine, FNP   I, Minette Brine, FNP, have reviewed all documentation for this visit. The documentation on 08/19/21 for the exam, diagnosis, procedures, and orders are all accurate and complete.   THE PATIENT IS ENCOURAGED TO PRACTICE SOCIAL DISTANCING DUE TO THE COVID-19 PANDEMIC.

## 2021-08-20 ENCOUNTER — Encounter: Payer: Self-pay | Admitting: Nurse Practitioner

## 2021-08-20 LAB — CMP14+EGFR
ALT: 13 IU/L (ref 0–32)
AST: 12 IU/L (ref 0–40)
Albumin/Globulin Ratio: 1.1 — ABNORMAL LOW (ref 1.2–2.2)
Albumin: 4 g/dL (ref 3.9–4.9)
Alkaline Phosphatase: 81 IU/L (ref 44–121)
BUN/Creatinine Ratio: 18 (ref 9–23)
BUN: 9 mg/dL (ref 6–20)
Bilirubin Total: 0.5 mg/dL (ref 0.0–1.2)
CO2: 22 mmol/L (ref 20–29)
Calcium: 9.6 mg/dL (ref 8.7–10.2)
Chloride: 103 mmol/L (ref 96–106)
Creatinine, Ser: 0.5 mg/dL — ABNORMAL LOW (ref 0.57–1.00)
Globulin, Total: 3.5 g/dL (ref 1.5–4.5)
Glucose: 85 mg/dL (ref 70–99)
Potassium: 3.8 mmol/L (ref 3.5–5.2)
Sodium: 129 mmol/L — ABNORMAL LOW (ref 134–144)
Total Protein: 7.5 g/dL (ref 6.0–8.5)
eGFR: 125 mL/min/{1.73_m2} (ref >59)

## 2021-08-20 LAB — MICROALBUMIN / CREATININE URINE RATIO
Creatinine, Urine: 216.1 mg/dL
Microalb/Creat Ratio: 8 mg/g creat (ref 0–29)
Microalbumin, Urine: 17.5 ug/mL

## 2021-08-20 LAB — HEMOGLOBIN A1C
Est. average glucose Bld gHb Est-mCnc: 111 mg/dL
Hgb A1c MFr Bld: 5.5 % (ref 4.8–5.6)

## 2021-08-20 LAB — CBC
Hematocrit: 41.6 % (ref 34.0–46.6)
Hemoglobin: 13.7 g/dL (ref 11.1–15.9)
MCH: 27.6 pg (ref 26.6–33.0)
MCHC: 32.9 g/dL (ref 31.5–35.7)
MCV: 84 fL (ref 79–97)
Platelets: 310 10*3/uL (ref 150–450)
RBC: 4.97 x10E6/uL (ref 3.77–5.28)
RDW: 15.8 % — ABNORMAL HIGH (ref 11.7–15.4)
WBC: 11.9 10*3/uL — ABNORMAL HIGH (ref 3.4–10.8)

## 2021-08-20 LAB — LIPID PANEL
Chol/HDL Ratio: 4 ratio (ref 0.0–4.4)
Cholesterol, Total: 186 mg/dL (ref 100–199)
HDL: 46 mg/dL (ref >39)
LDL Chol Calc (NIH): 131 mg/dL — ABNORMAL HIGH (ref 0–99)
Triglycerides: 48 mg/dL (ref 0–149)
VLDL Cholesterol Cal: 9 mg/dL (ref 5–40)

## 2021-08-20 LAB — VITAMIN D 25 HYDROXY (VIT D DEFICIENCY, FRACTURES): Vit D, 25-Hydroxy: 35.7 ng/mL (ref 30.0–100.0)

## 2021-08-23 ENCOUNTER — Encounter: Payer: Self-pay | Admitting: Nurse Practitioner

## 2021-08-28 ENCOUNTER — Other Ambulatory Visit: Payer: Self-pay | Admitting: Nurse Practitioner

## 2021-08-28 DIAGNOSIS — D72828 Other elevated white blood cell count: Secondary | ICD-10-CM

## 2021-09-04 ENCOUNTER — Encounter (INDEPENDENT_AMBULATORY_CARE_PROVIDER_SITE_OTHER): Payer: Self-pay

## 2021-11-01 IMAGING — US US PELVIS COMPLETE WITH TRANSVAGINAL
1 series · 15 of 25 positions shown · non-contrast
Comparison: 08/16/2018

CLINICAL DATA: Abnormal uterine bleeding since 03/03/2020, prior
Caesarean section x 2; unknown LMP

EXAM:
TRANSABDOMINAL AND TRANSVAGINAL ULTRASOUND OF PELVIS
TECHNIQUE: Both transabdominal and transvaginal ultrasound examinations of the
pelvis were performed. Transabdominal technique was performed for
global imaging of the pelvis including uterus, ovaries, adnexal
regions, and pelvic cul-de-sac. It was necessary to proceed with
endovaginal exam following the transabdominal exam to visualize the
endometrium and adnexa.

[Series 1: us pelvis complete with transvaginal · 60 acquisitions, 15 frames shown]
[im 1/60]
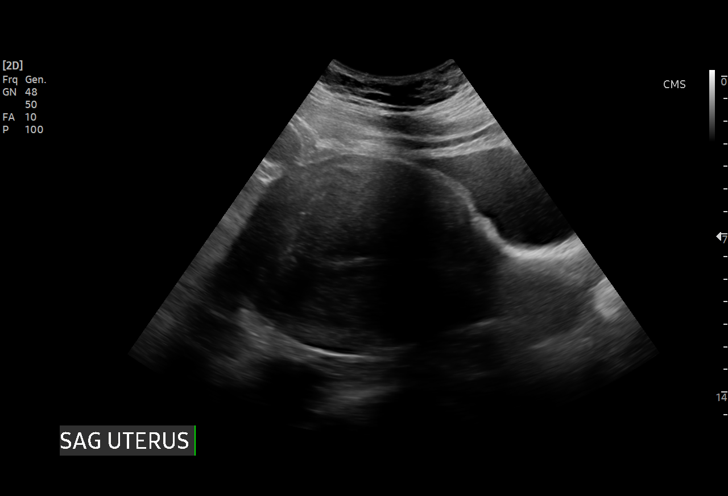
[im 5/60]
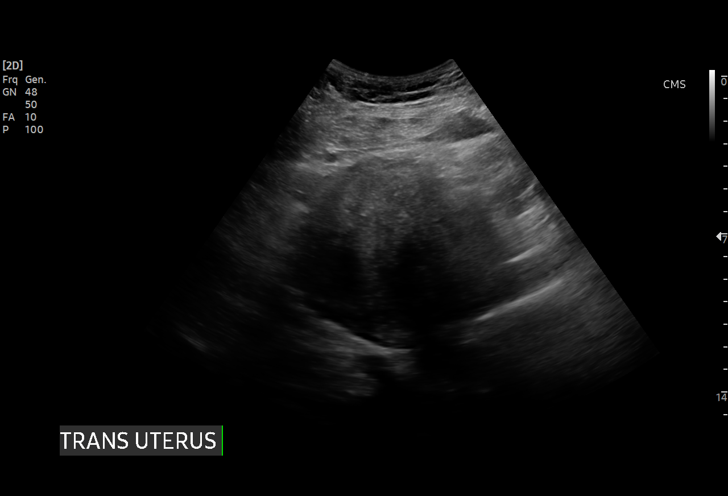
[im 10/60]
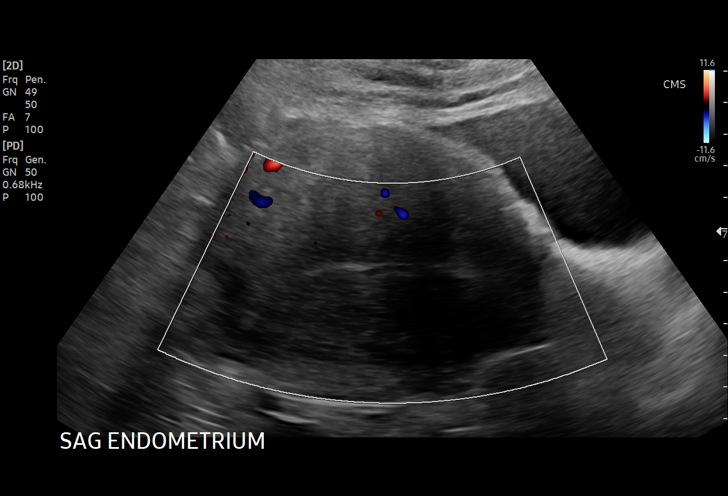
[im 13/60]
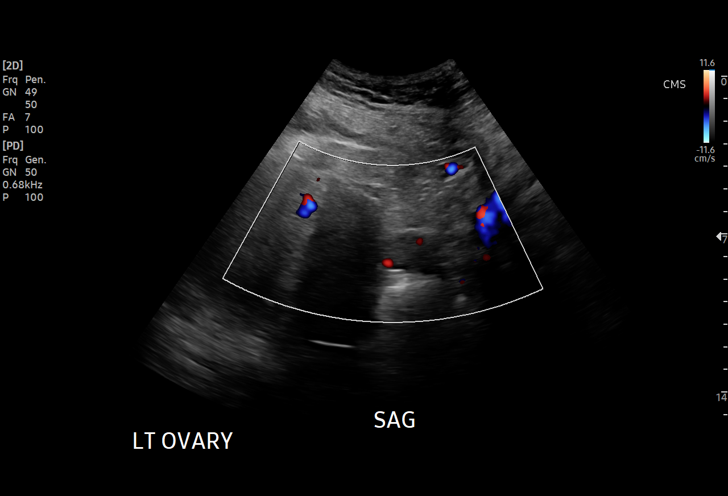
[im 18/60]
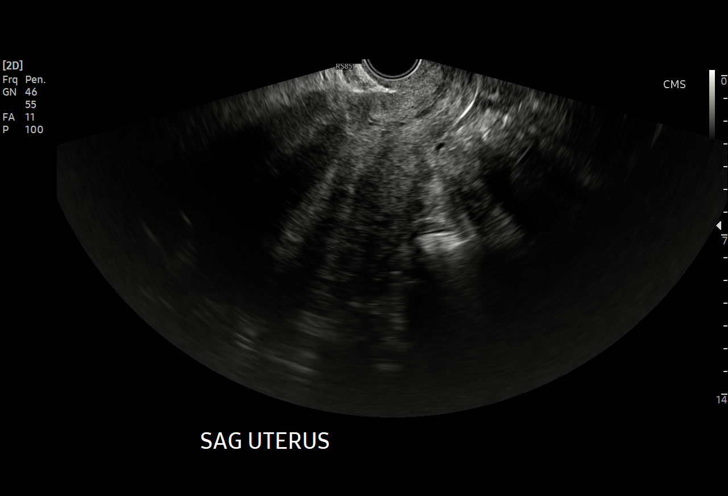
[im 23/60]
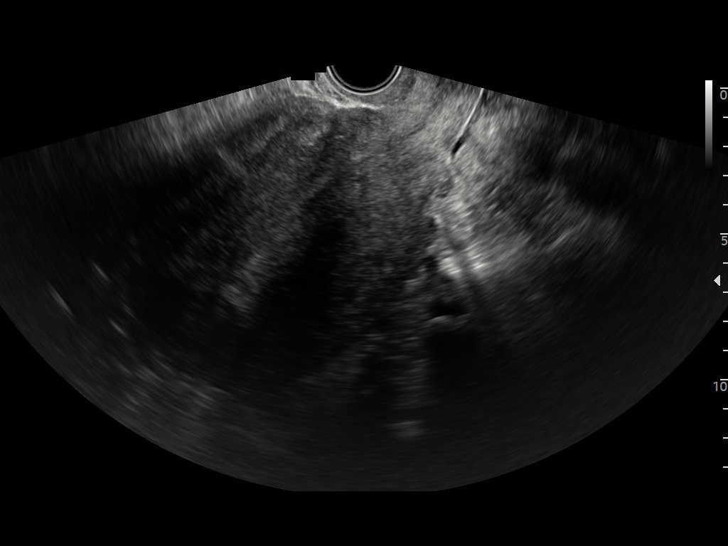
[im 25/60]
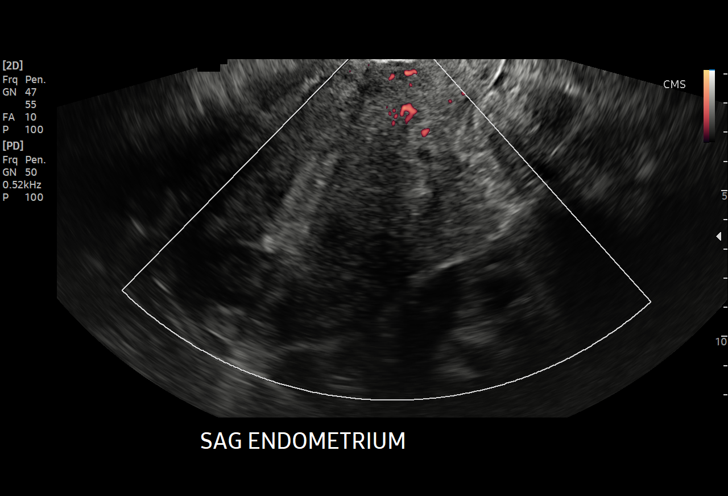
[im 30/60]
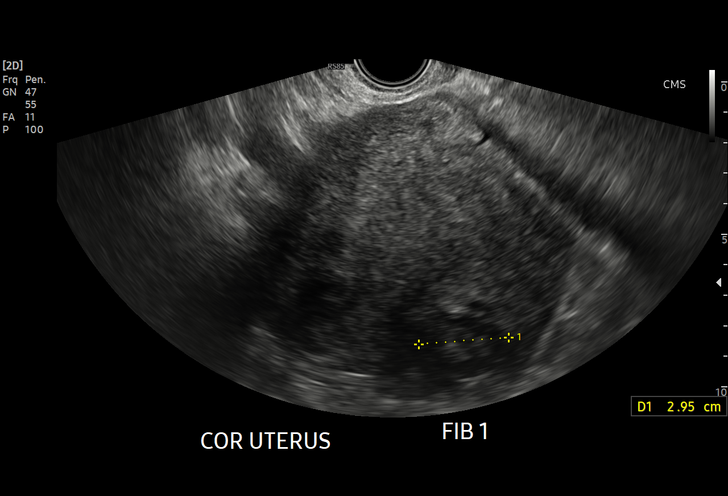
[im 35/60]
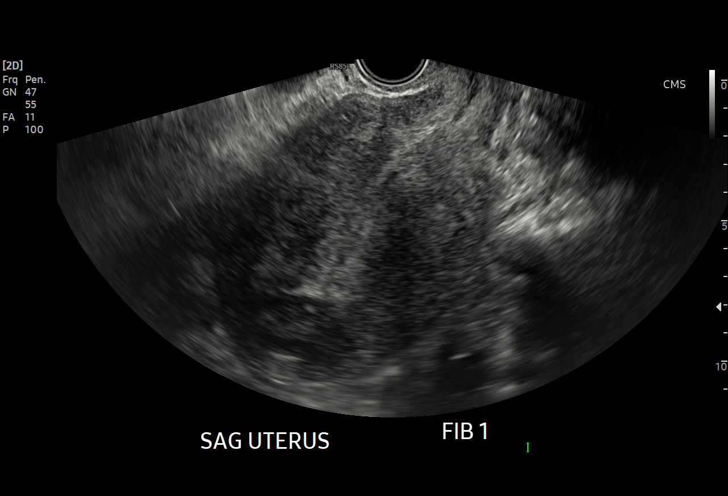
[im 37/60]
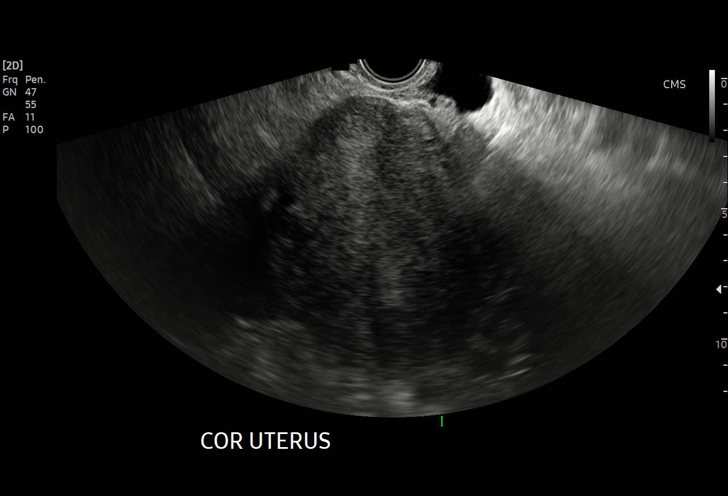
[im 42/60]
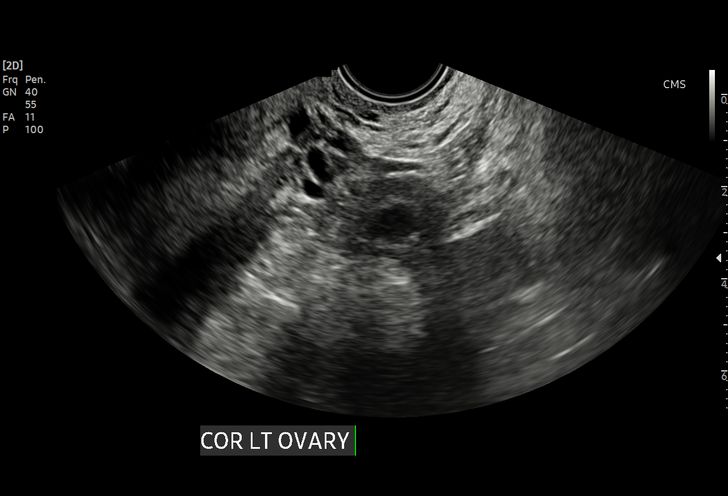
[im 47/60]
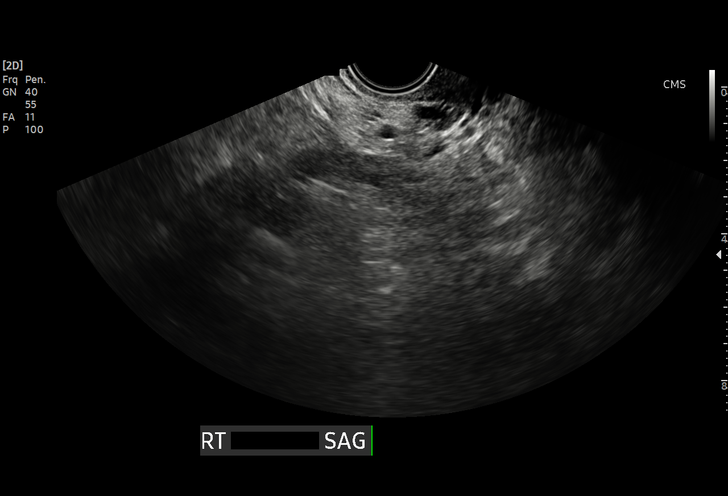
[im 50/60]
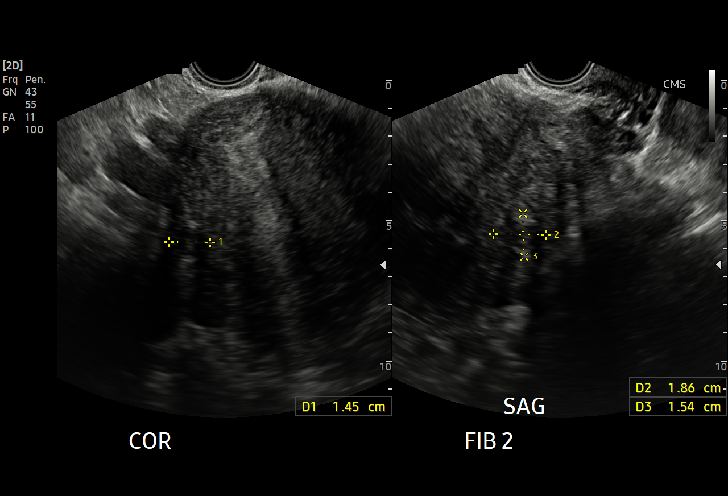
[im 55/60]
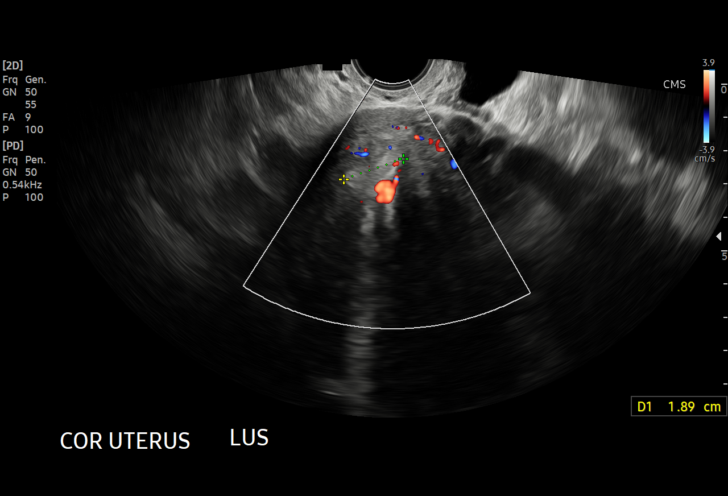
[im 60/60]
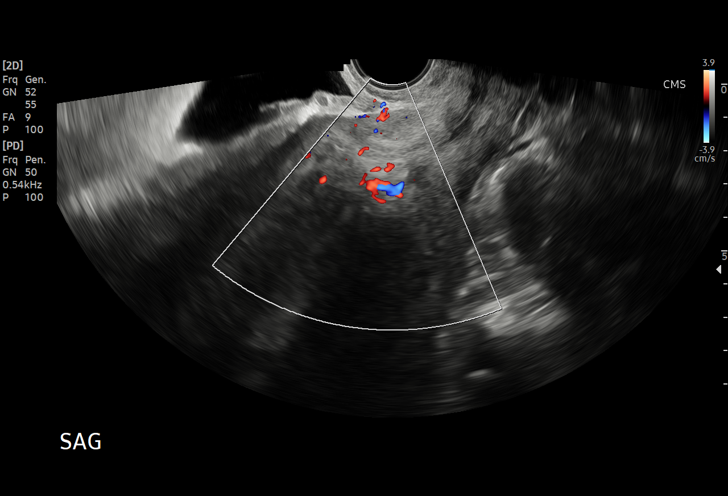

[15 of 25 positions shown; findings below may reference images not displayed]

FINDINGS: Uterus

Measurements: 14.6 x 9.0 x 9.3 cm = volume: 635 mL. Anteverted with
slight retroflexion. Heterogeneous myometrium. Small submucosal
leiomyoma at upper uterus 3.0 x 3.6 x 2.9 cm. Additional small
intramural leiomyoma 1.9 cm diameter posterior uterus.

Endometrium

Thickness: 12 mm. Distortion/expansion of the endometrium extending
into the Caesarean section scar. No mass or fluid.

Right ovary

Measurements: 3.1 x 1.6 x 2.4 cm = volume: 6.2 mL. Normal morphology
without mass

Left ovary

Measurements: 2.5 x 2.6 x 2.2 cm = volume: 7.5 mL. Normal morphology
without mass

Other findings

No free pelvic fluid.  No adnexal masses.
IMPRESSION: 2 small uterine leiomyomata, largest 3.6 cm diameter at upper
uterus.

Caesarean section scar with distortion of the adjacent endometrial
complex.

Otherwise negative exam.

## 2022-01-31 ENCOUNTER — Telehealth: Payer: Self-pay

## 2022-01-31 NOTE — Telephone Encounter (Signed)
Unable to lvm. Pt needs an appt. Has not been seen since 07/2021 with diabetes

## 2022-02-14 ENCOUNTER — Other Ambulatory Visit: Payer: Self-pay | Admitting: Obstetrics and Gynecology

## 2022-02-16 ENCOUNTER — Other Ambulatory Visit: Payer: Self-pay | Admitting: Nurse Practitioner

## 2022-02-16 DIAGNOSIS — E119 Type 2 diabetes mellitus without complications: Secondary | ICD-10-CM

## 2022-02-17 ENCOUNTER — Other Ambulatory Visit: Payer: Self-pay | Admitting: Nurse Practitioner

## 2022-02-17 DIAGNOSIS — E119 Type 2 diabetes mellitus without complications: Secondary | ICD-10-CM

## 2022-03-11 ENCOUNTER — Encounter: Payer: Self-pay | Admitting: Nurse Practitioner

## 2022-03-17 ENCOUNTER — Encounter: Payer: Self-pay | Admitting: *Deleted

## 2022-03-17 NOTE — Progress Notes (Deleted)
PATIENT: PAIZLY MCOWEN DOB: 12-04-1985  REASON FOR VISIT: follow up HISTORY FROM: patient PRIMARY NEUROLOGIST:   HISTORY OF PRESENT ILLNESS: Today 03/17/22  HISTORY 03/13/21:   Ms. Gale Journey is a 37 year old female with a history of obstructive sleep apnea on CPAP.  She returns today for follow-up.  She states that she was using the CPAP but would wake up and she had taken it off unknowingly during the night.  She reports eventually she did stop using it.  She is planning on having weight loss surgery specifically the gastric sleeve in April.  She returns today for an evaluation.      REVIEW OF SYSTEMS: Out of a complete 14 system review of symptoms, the patient complains only of the following symptoms, and all other reviewed systems are negative.  FSS ESS  ALLERGIES: Allergies  Allergen Reactions   Shellfish Allergy Anaphylaxis   Fish Allergy Swelling    Raw shrimp    HOME MEDICATIONS: Outpatient Medications Prior to Visit  Medication Sig Dispense Refill   blood glucose meter kit and supplies KIT Dispense based on patient and insurance preference. Check blood sugar two times a day and as needed (FOR ICD-9 250.00, 250.01). 1 each 0   EPINEPHrine 0.3 mg/0.3 mL IJ SOAJ injection Inject 0.3 mg into the muscle as needed for anaphylaxis. 1 each 2   MYFEMBREE 40-1-0.5 MG TABS Take 1 tablet by mouth daily. (Patient not taking: Reported on 08/19/2021)     tirzepatide (MOUNJARO) 5 MG/0.5ML Pen INJECT 5 MG SUBCUTANEOUSLY WEEKLY 6 mL 0   valACYclovir (VALTREX) 500 MG tablet TAKE 1 TABLET BY MOUTH DAILY. CAN INCREASE TO TWICE DAILY FOR 5 DAYS IN THE EVENT OF RECURRENCE 30 tablet 12   Vitamin D, Ergocalciferol, (DRISDOL) 1.25 MG (50000 UNIT) CAPS capsule Take 1 capsule (50,000 Units total) by mouth 2 (two) times a week. (Patient not taking: Reported on 08/19/2021) 24 capsule 1   No facility-administered medications prior to visit.    PAST MEDICAL HISTORY: Past Medical History:   Diagnosis Date   Diabetes mellitus without complication (HCC)    Herpes    PCOS (polycystic ovarian syndrome)    Vaginal Pap smear, abnormal    had colposcopy repeat pap negative    PAST SURGICAL HISTORY: Past Surgical History:  Procedure Laterality Date   CESAREAN SECTION     CESAREAN SECTION N/A 07/24/2015   Procedure: CESAREAN SECTION;  Surgeon: Donnamae Jude, MD;  Location: [facility];  Service: Obstetrics;  Laterality: N/A;    FAMILY HISTORY: Family History  Problem Relation Age of Onset   Hypertension Mother    Hypertension Father    Diabetes Father    Diabetes Maternal Grandmother    Hypertension Maternal Grandmother    Diabetes Maternal Grandfather    Hypertension Maternal Grandfather    Congestive Heart Failure Maternal Grandfather    Diabetes Paternal Grandmother    Hypertension Paternal Grandmother    Diabetes Paternal Grandfather    Hypertension Paternal Grandfather    Stroke Paternal Grandfather     SOCIAL HISTORY: Social History   Socioeconomic History   Marital status: Single    Spouse name: Not on file   Number of children: Not on file   Years of education: Not on file   Highest education level: Not on file  Occupational History   Not on file  Tobacco Use   Smoking status: Never   Smokeless tobacco: Never  Vaping Use   Vaping Use: Never used  Substance and Sexual Activity   Alcohol use: No   Drug use: No   Sexual activity: Yes    Partners: Male    Birth control/protection: None  Other Topics Concern   Not on file  Social History Narrative   Lives with her children   Caffeine:  1 cup coffee/day, diet soda 1-2/day   Social Determinants of Health   Financial Resource Strain: Not on file  Food Insecurity: Not on file  Transportation Needs: Not on file  Physical Activity: Not on file  Stress: Not on file  Social Connections: Not on file  Intimate Partner Violence: Not on file      PHYSICAL EXAM  There were no vitals  filed for this visit. There is no height or weight on file to calculate BMI.  Generalized: Well developed, in no acute distress  Chest: Lungs clear to auscultation bilaterally  Neurological examination  Mentation: Alert oriented to time, place, history taking. Follows all commands speech and language fluent Cranial nerve II-XII: Extraocular movements were full, visual field were full on confrontational test Head turning and shoulder shrug  were normal and symmetric. Motor: The motor testing reveals 5 over 5 strength of all 4 extremities. Good symmetric motor tone is noted throughout.  Sensory: Sensory testing is intact to soft touch on all 4 extremities. No evidence of extinction is noted.  Gait and station: Gait is normal.    DIAGNOSTIC DATA (LABS, IMAGING, TESTING) - I reviewed patient records, labs, notes, testing and imaging myself where available.  Lab Results  Component Value Date   WBC 11.9 (H) 08/19/2021   HGB 13.7 08/19/2021   HCT 41.6 08/19/2021   MCV 84 08/19/2021   PLT 310 08/19/2021      Component Value Date/Time   NA 129 (L) 08/19/2021 1606   K 3.8 08/19/2021 1606   CL 103 08/19/2021 1606   CO2 22 08/19/2021 1606   GLUCOSE 85 08/19/2021 1606   GLUCOSE 105 (H) 04/17/2020 1242   BUN 9 08/19/2021 1606   CREATININE 0.50 (L) 08/19/2021 1606   CALCIUM 9.6 08/19/2021 1606   PROT 7.5 08/19/2021 1606   ALBUMIN 4.0 08/19/2021 1606   AST 12 08/19/2021 1606   ALT 13 08/19/2021 1606   ALKPHOS 81 08/19/2021 1606   BILITOT 0.5 08/19/2021 1606   GFRNONAA >60 04/17/2020 1242   GFRAA 134 02/01/2020 1705   Lab Results  Component Value Date   CHOL 186 08/19/2021   HDL 46 08/19/2021   LDLCALC 131 (H) 08/19/2021   TRIG 48 08/19/2021   CHOLHDL 4.0 08/19/2021   Lab Results  Component Value Date   HGBA1C 5.5 08/19/2021   No results found for: "VITAMINB12" Lab Results  Component Value Date   TSH 1.750 03/14/2021      ASSESSMENT AND PLAN 37 y.o. year old female   has a past medical history of Diabetes mellitus without complication ([place]), Herpes, PCOS (polycystic ovarian syndrome), and Vaginal Pap smear, abnormal. here with:  OSA on CPAP  - CPAP compliance excellent - Good treatment of AHI  - Encourage patient to use CPAP nightly and > 4 hours each night - F/U in 1 year or sooner if needed   I spent *** minutes of face-to-face and non-face-to-face time with patient.  This included previsit chart review, lab review, study review, order entry, electronic health record documentation, patient education.  Ward Givens, MSN, NP-C 03/17/2022, 4:58 PM [facility] [address], [place], [place] [address] [phone]

## 2022-03-18 ENCOUNTER — Telehealth: Payer: Managed Care, Other (non HMO) | Admitting: Adult Health

## 2022-04-17 ENCOUNTER — Encounter: Payer: Self-pay | Admitting: Nurse Practitioner

## 2022-04-17 NOTE — Telephone Encounter (Signed)
Called patient related to her message about her mental health. I have advised her to go to the [facility] since she can not get in to see me until 4/8. I am concerned about her mental health. She is clear and cooperative during conversation and verbalizes understanding of plan.

## 2022-05-05 ENCOUNTER — Ambulatory Visit: Payer: Managed Care, Other (non HMO) | Admitting: Nurse Practitioner

## 2022-05-05 DIAGNOSIS — B009 Herpesviral infection, unspecified: Secondary | ICD-10-CM | POA: Insufficient documentation

## 2022-07-08 IMAGING — RF DG UGI W/ HIGH DENSITY W/O KUB
1 series · 15 of 22 positions shown · non-contrast
Comparison: None.

CLINICAL DATA: Morbid obesity. Pre-op evaluation for bariatric
surgery.

EXAM:
UPPER GI SERIES WITH KUB
TECHNIQUE: After obtaining a scout radiograph a routine upper GI series was
performed using thin barium.
FLUOROSCOPY TIME:  Fluoroscopy Time:  3 minutes 42 seconds
Radiation Exposure Index (if provided by the fluoroscopic device):
174.1 mGy
Number of Acquired Spot Images: 0

[Series 1: one shot · 0.14mm/px · 15 of 22 slices shown]
[im 1/22]
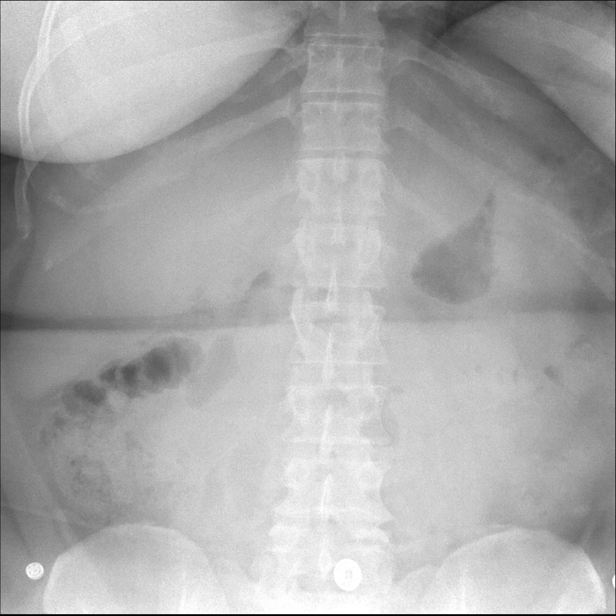
[im 3/22]
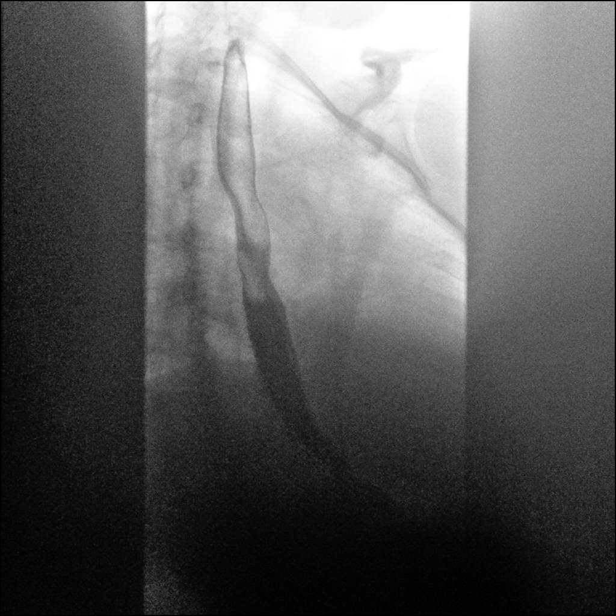
[im 4/22]
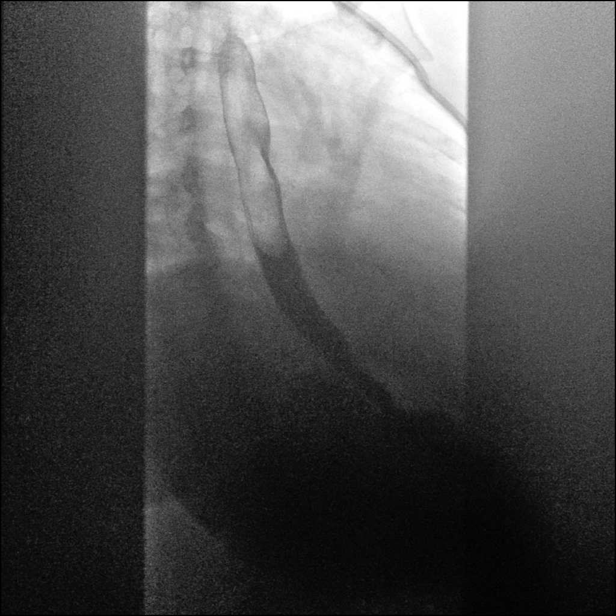
[im 6/22]
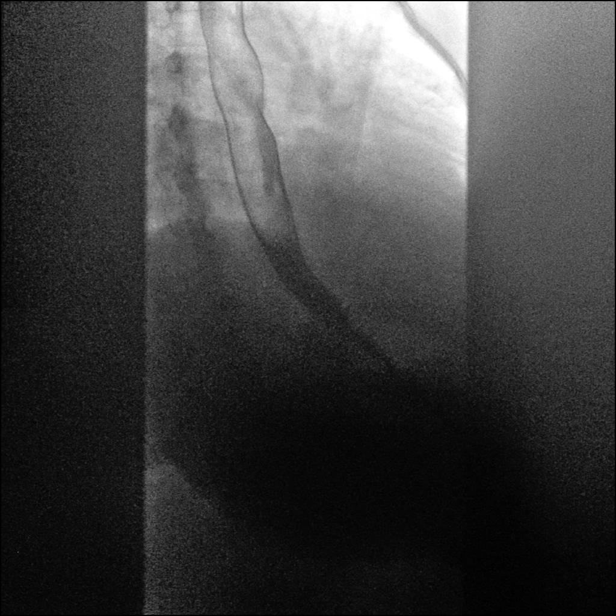
[im 7/22]
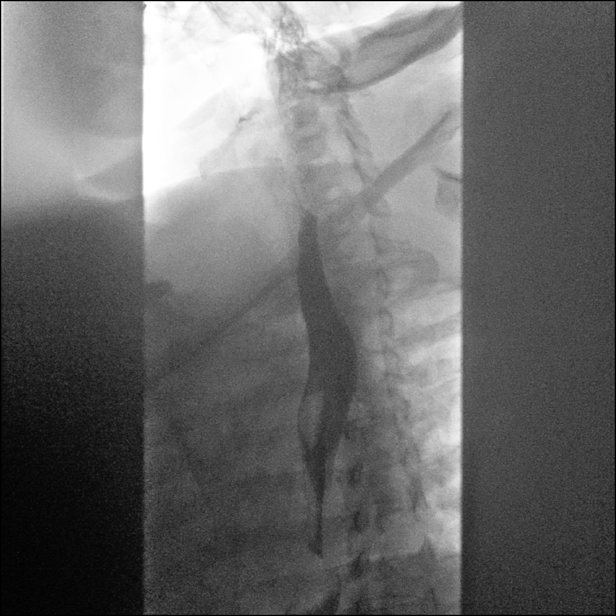
[im 9/22]
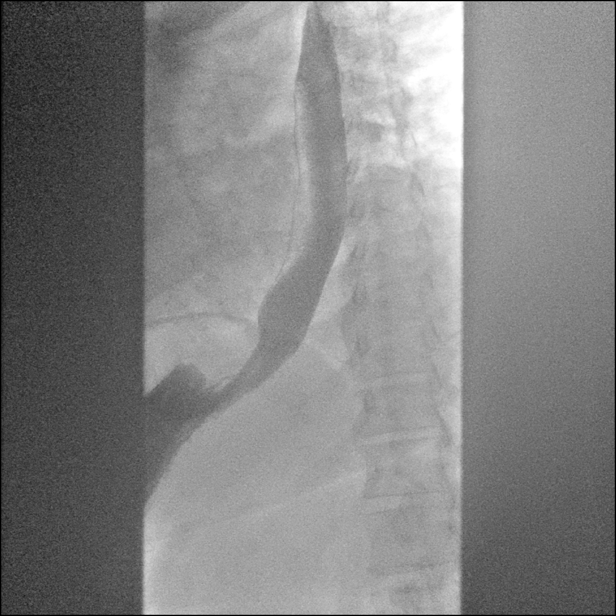
[im 10/22]
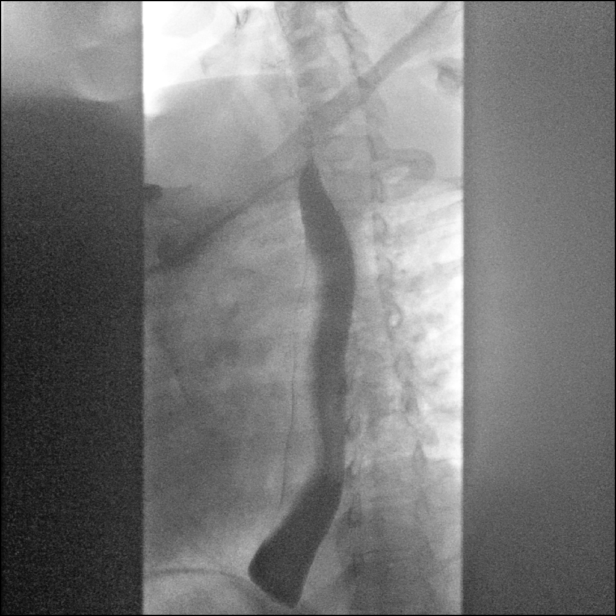
[im 12/22]
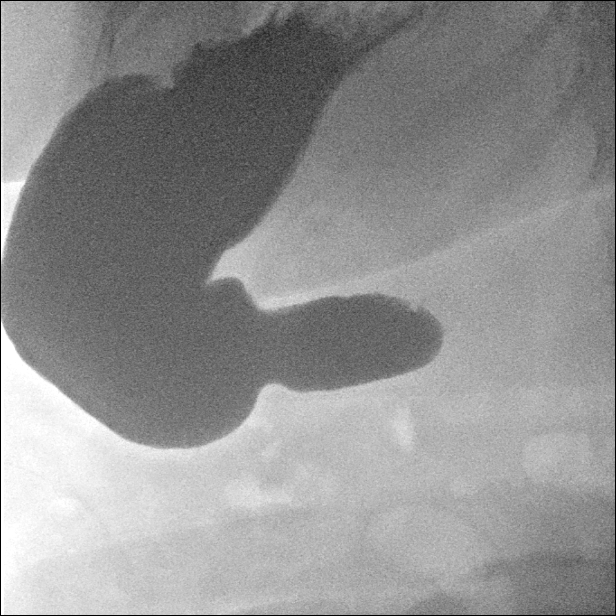
[im 13/22]
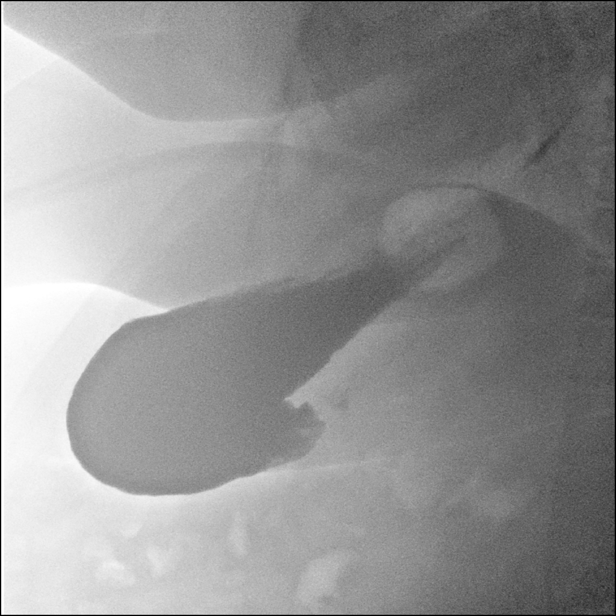
[im 14/22]
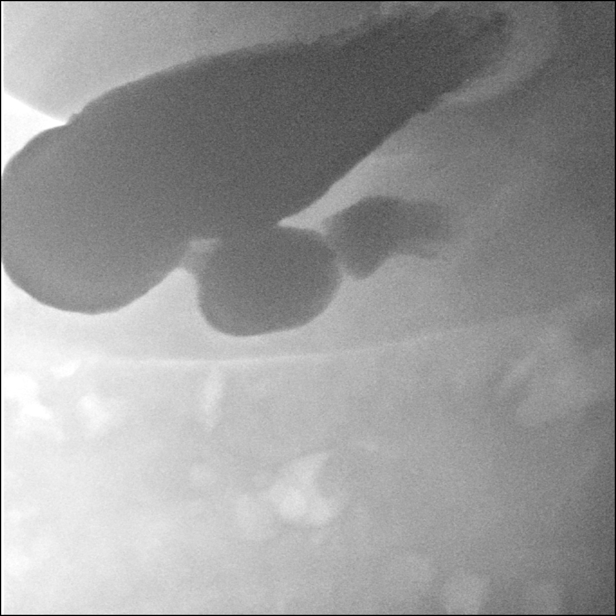
[im 16/22]
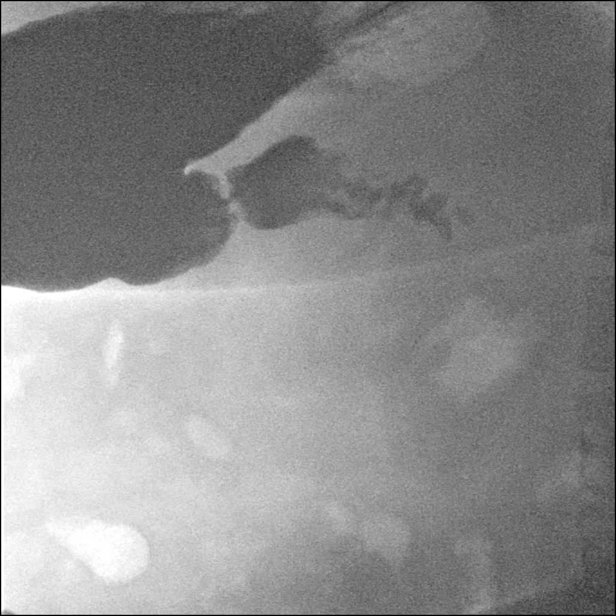
[im 17/22]
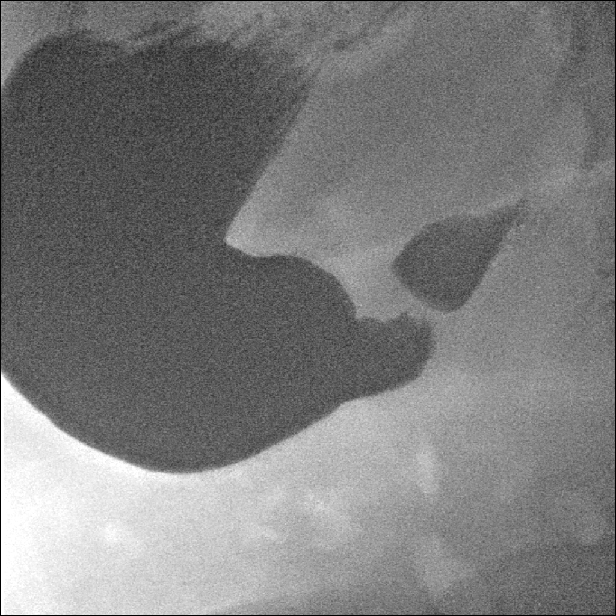
[im 19/22]
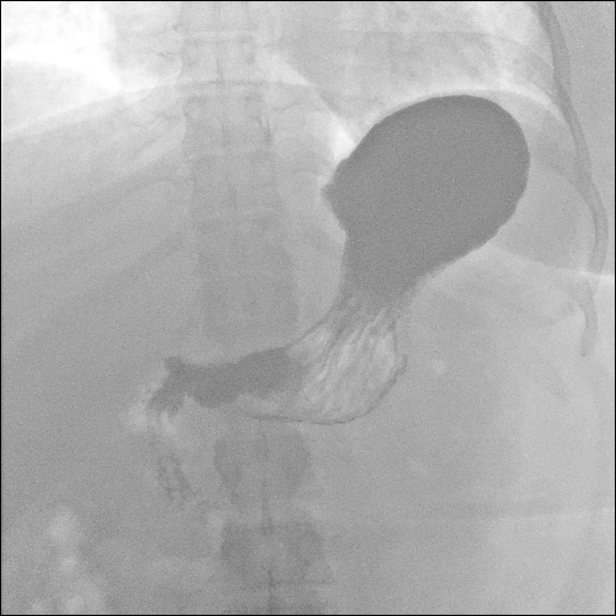
[im 20/22]
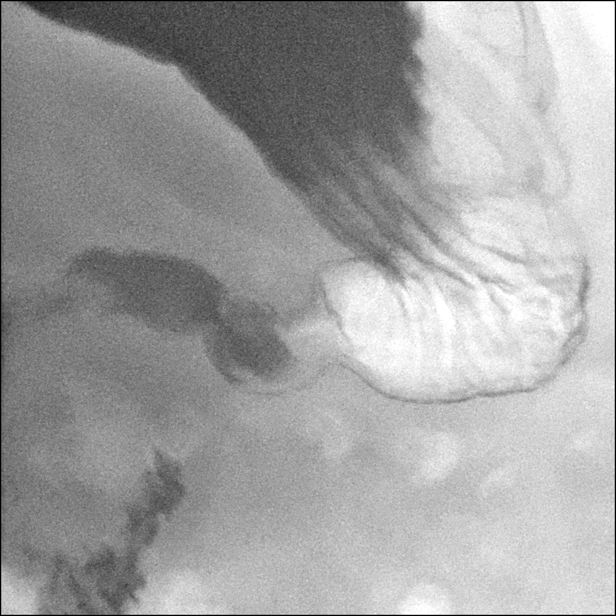
[im 22/22]
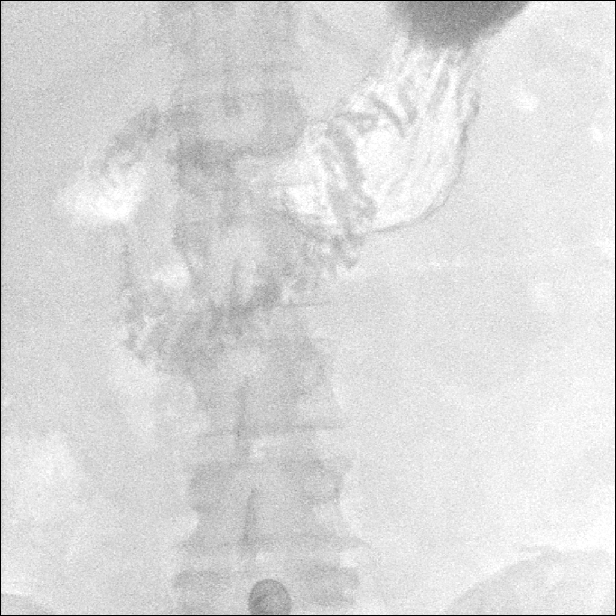

[15 of 22 positions shown; findings below may reference images not displayed]

FINDINGS: Scout radiograph:  Unremarkable bowel gas pattern.

Esophagus: No evidence of esophageal mass or stricture. Motility is
within normal limits. No gastroesophageal reflux observed.

Stomach: No hiatal hernia visualized. Normal size and appearance. No
evidence of gastric mass or ulcer. Mildly delayed gastric emptying
noted.

Duodenum: Normal appearance of duodenal bulb and sweep. Normal
position of ligament of Treitz. No ulcer, stricture, or other
significant abnormality seen.

Other:  None.
IMPRESSION: Mildly delayed gastric emptying. Otherwise negative upper GI series.

## 2022-08-19 ENCOUNTER — Emergency Department (HOSPITAL_BASED_OUTPATIENT_CLINIC_OR_DEPARTMENT_OTHER): Payer: Managed Care, Other (non HMO)

## 2022-08-19 ENCOUNTER — Emergency Department (HOSPITAL_BASED_OUTPATIENT_CLINIC_OR_DEPARTMENT_OTHER)
Admission: EM | Admit: 2022-08-19 | Discharge: 2022-08-19 | Disposition: A | Discharge status: Home / Self Care | Payer: Managed Care, Other (non HMO) | Attending: Emergency Medicine | Admitting: Emergency Medicine

## 2022-08-19 ENCOUNTER — Other Ambulatory Visit: Payer: Self-pay

## 2022-08-19 ENCOUNTER — Encounter (HOSPITAL_BASED_OUTPATIENT_CLINIC_OR_DEPARTMENT_OTHER): Payer: Self-pay | Admitting: Emergency Medicine

## 2022-08-19 DIAGNOSIS — Z6841 Body Mass Index (BMI) 40.0 and over, adult: Secondary | ICD-10-CM | POA: Insufficient documentation

## 2022-08-19 DIAGNOSIS — R0789 Other chest pain: Secondary | ICD-10-CM | POA: Insufficient documentation

## 2022-08-19 DIAGNOSIS — E669 Obesity, unspecified: Secondary | ICD-10-CM | POA: Insufficient documentation

## 2022-08-19 LAB — TROPONIN I (HIGH SENSITIVITY)
Troponin I (High Sensitivity): <2 ng/L (ref <18)
Troponin I (High Sensitivity): <2 ng/L (ref <18)

## 2022-08-19 LAB — CBC
HCT: 35.9 % — ABNORMAL LOW (ref 36.0–46.0)
Hemoglobin: 11.8 g/dL — ABNORMAL LOW (ref 12.0–15.0)
MCH: 28.7 pg (ref 26.0–34.0)
MCHC: 32.9 g/dL (ref 30.0–36.0)
MCV: 87.3 fL (ref 80.0–100.0)
Platelets: 265 10*3/uL (ref 150–400)
RBC: 4.11 MIL/uL (ref 3.87–5.11)
RDW: 13.2 % (ref 11.5–15.5)
WBC: 9.9 10*3/uL (ref 4.0–10.5)
nRBC: 0 % (ref 0.0–0.2)

## 2022-08-19 LAB — BASIC METABOLIC PANEL
Anion gap: 9 (ref 5–15)
BUN: 9 mg/dL (ref 6–20)
CO2: 22 mmol/L (ref 22–32)
Calcium: 8.4 mg/dL — ABNORMAL LOW (ref 8.9–10.3)
Chloride: 105 mmol/L (ref 98–111)
Creatinine, Ser: 0.65 mg/dL (ref 0.44–1.00)
GFR, Estimated: >60 mL/min (ref >60)
Glucose, Bld: 93 mg/dL (ref 70–99)
Potassium: 3.8 mmol/L (ref 3.5–5.1)
Sodium: 136 mmol/L (ref 135–145)

## 2022-08-19 LAB — PREGNANCY, URINE: Preg Test, Ur: NEGATIVE

## 2022-08-19 MED ORDER — ONDANSETRON HCL 4 MG/2ML IJ SOLN
4.0000 mg | Freq: Once | INTRAMUSCULAR | Status: AC
  Administered 2022-08-19: 4 mg via INTRAVENOUS
  Filled 2022-08-19: qty 2

## 2022-08-19 MED ORDER — IOHEXOL 350 MG/ML SOLN
100.0000 mL | Freq: Once | INTRAVENOUS | Status: AC | PRN
  Administered 2022-08-19: 100 mL via INTRAVENOUS

## 2022-08-19 MED ORDER — HYDROCODONE-ACETAMINOPHEN 5-325 MG PO TABS: 1.0000 | ORAL_TABLET | Freq: Once | ORAL | Status: DC

## 2022-08-19 MED ORDER — KETOROLAC TROMETHAMINE 30 MG/ML IJ SOLN
30.0000 mg | Freq: Once | INTRAMUSCULAR | Status: AC
  Administered 2022-08-19: 30 mg via INTRAVENOUS
  Filled 2022-08-19: qty 1

## 2022-08-19 MED ORDER — MORPHINE SULFATE (PF) 4 MG/ML IV SOLN
4.0000 mg | Freq: Once | INTRAVENOUS | Status: AC
  Administered 2022-08-19: 4 mg via INTRAVENOUS
  Filled 2022-08-19: qty 1

## 2022-08-19 MED ORDER — ACETAMINOPHEN-CODEINE 300-30 MG PO TABS: 1.0000 | ORAL_TABLET | Freq: Four times a day (QID) | ORAL | 0 refills | Status: DC | PRN

## 2022-08-19 NOTE — ED Triage Notes (Signed)
Patient with sharp central chest pain making it hard to breath that started 2 days ago. States she was resting in bed when this happened. Pain has been constant. Pain worsens with inspiration. Denies n/v/d. States this has happened several times in the past year but hasn't been evaluated for it. Denies any heart hx.

## 2022-08-19 NOTE — ED Notes (Signed)
Patient transported to X-ray 

## 2022-08-19 NOTE — ED Notes (Signed)
ED Provider at bedside. 

## 2022-08-19 NOTE — Discharge Instructions (Addendum)
It was a pleasure taking care of you here in the emergency department.  Your labs and imaging are reassuring.  You have started a medication to help with your pain.  Please use caution as this does contain Tylenol in it.  Use warm compress to area  Follow-up outpatient  Return for new or worsening symptoms.

## 2022-08-19 NOTE — ED Provider Notes (Signed)
[place] [facility] Provider Note   CSN: [id number] Arrival date & time: 08/19/22  1840    History  Chief Complaint  Patient presents with   Chest Pain    Kristin Love is a 37 y.o. female here for evaluation of pleuritic CP. Began 2 days ago. Constant worse with deep breathing. "Hurts" to talk. No ST, cough, congestion, rhinorrhea. No pain swelling to LE. No hx of PE, DVT. No recent surgery, immobilization, malignancy. Some nausea without emesis. No abd pain. Denies chance of pregnancy. No PND, orthopnea. Similar episodes in the past however not seen for episodes. Typically resolves with after 1 day with previous episodes which is why she looked for care today as it has not improved. Taking tylenol. 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 No sick contacts    HPI     Home Medications Prior to Admission medications   Medication Sig Start Date End Date Taking? Authorizing Provider  acetaminophen-codeine (TYLENOL #3) 300-30 MG tablet Take 1-2 tablets by mouth every 6 (six) hours as needed for moderate pain. 08/19/22  Yes Yashika Mask A, PA-C  blood glucose meter kit and supplies KIT Dispense based on patient and insurance preference. Check blood sugar two times a day and as needed (FOR ICD-9 250.00, 250.01). 01/09/20   Arnette Felts, FNP  EPINEPHrine 0.3 mg/0.3 mL IJ SOAJ injection Inject 0.3 mg into the muscle as needed for anaphylaxis. 11/09/19   Arnette Felts, FNP  tirzepatide [facility]) 5 MG/0.5ML Pen INJECT 5 MG SUBCUTANEOUSLY WEEKLY 02/17/22   Arnette Felts, FNP  valACYclovir (VALTREX) 500 MG tablet TAKE 1 TABLET BY MOUTH DAILY. CAN INCREASE TO TWICE DAILY FOR 5 DAYS IN THE EVENT OF RECURRENCE 06/14/20   Constant, Gigi Gin, MD      Allergies    Shellfish allergy and Fish allergy    Review of Systems   Review of Systems  HENT: Negative.    Respiratory:  Positive for shortness of breath. Negative for apnea, cough, choking, chest  tightness, wheezing and stridor.   Cardiovascular:  Positive for chest pain. Negative for palpitations and leg swelling.  Gastrointestinal: Negative.   Genitourinary: Negative.   Musculoskeletal: Negative.   Skin: Negative.   Neurological: Negative.   All other systems reviewed and are negative.   Physical Exam Updated Vital Signs BP 113/74   Pulse 72   Temp 98.4 F (36.9 C)   Resp 19   Ht 5\' 8"  (1.727 m)   Wt (!) 147.4 kg   SpO2 98%   BMI 49.42 kg/m  Physical Exam Vitals and nursing note reviewed.  Constitutional:      General: She is not in acute distress.    Appearance: She is well-developed. She is obese. She is not ill-appearing, toxic-appearing or diaphoretic.  HENT:     Head: Atraumatic.  Eyes:     Pupils: Pupils are equal, round, and reactive to light.  Cardiovascular:     Rate and Rhythm: Normal rate.     Pulses:          Radial pulses are 2+ on the right side and 2+ on the left side.       Dorsalis pedis pulses are 2+ on the right side and 2+ on the left side.     Heart sounds: Normal heart sounds.  Pulmonary:     Effort: Pulmonary effort is normal. No respiratory distress.     Breath sounds: Normal breath sounds.     Comments: Clear  bilaterally, splints with deep breathing Chest:     Comments: Mild tenderness to anterior chest wall, no crepitus Abdominal:     General: Bowel sounds are normal. There is no distension or abdominal bruit.     Palpations: Abdomen is soft. There is no mass.     Tenderness: There is no abdominal tenderness. There is no guarding or rebound.  Musculoskeletal:        General: Normal range of motion.     Cervical back: Normal range of motion.     Right lower leg: No tenderness. No edema.     Left lower leg: No tenderness. No edema.     Comments: Full rom, no le edema. Compartments soft. Non tender calves bil  Skin:    General: Skin is warm and dry.     Comments: No edema, erythema, warmth  Neurological:     General: No focal  deficit present.     Mental Status: She is alert.  Psychiatric:        Mood and Affect: Mood normal.    ED Results / Procedures / Treatments   Labs (all labs ordered are listed, but only abnormal results are displayed) Labs Reviewed  BASIC METABOLIC PANEL - Abnormal; Notable for the following components:      Result Value   Calcium 8.4 (*)    All other components within normal limits  CBC - Abnormal; Notable for the following components:   Hemoglobin 11.8 (*)    HCT 35.9 (*)    All other components within normal limits  PREGNANCY, URINE  TROPONIN I (HIGH SENSITIVITY)  TROPONIN I (HIGH SENSITIVITY)    EKG EKG Interpretation Date/Time:  Tuesday August 19 2022 18:50:01 EDT Ventricular Rate:  87 PR Interval:  186 QRS Duration:  97 QT Interval:  353 QTC Calculation: 425 R Axis:   33  Text Interpretation: Sinus rhythm Low voltage, precordial leads Borderline T abnormalities, anterior leads No significant change since prior 12/19 Confirmed by Meridee Score [phone]) on 08/19/2022 6:57:19 PM  Radiology CT Angio Chest PE W and/or Wo Contrast  Result Date: 08/19/2022 CLINICAL DATA:  Concern for pulmonary embolism. EXAM: CT ANGIOGRAPHY CHEST WITH CONTRAST TECHNIQUE: Multidetector CT imaging of the chest was performed using the standard protocol during bolus administration of intravenous contrast. Multiplanar CT image reconstructions and MIPs were obtained to evaluate the vascular anatomy. RADIATION DOSE REDUCTION: This exam was performed according to the departmental dose-optimization program which includes automated exposure control, adjustment of the mA and/or kV according to patient size and/or use of iterative reconstruction technique. CONTRAST:  OMNIPAQUE IOHEXOL 350 MG/ML SOLN COMPARISON:  Chest radiograph dated 08/19/2022. FINDINGS: Evaluation of this exam is limited due to respiratory motion artifact as well as streak artifact caused by patient's arms and body habitus.  Cardiovascular: Top-normal cardiac size. No pericardial effusion. The thoracic aorta is unremarkable. Evaluation of the pulmonary arteries is limited due to factors above and suboptimal opacification and timing of the contrast. No central pulmonary artery embolus identified. Mediastinum/Nodes: No hilar or mediastinal adenopathy. The esophagus is grossly unremarkable. No mediastinal fluid collection. Lungs/Pleura: The lungs are clear. There is no pleural effusion or pneumothorax. The central airways are patent. Upper Abdomen: Postsurgical changes of gastric sleeve. Musculoskeletal: No acute osseous pathology. Review of the MIP images confirms the above findings. IMPRESSION: 1. No acute intrathoracic pathology. No CT evidence of central pulmonary artery embolus. 2. Postsurgical changes of gastric sleeve. Electronically Signed   By: Ceasar Mons.D.  On: 08/19/2022 21:23   DG Chest 2 View  Result Date: 08/19/2022 CLINICAL DATA:  Chest pain. EXAM: CHEST - 2 VIEW COMPARISON:  April 17, 2020 FINDINGS: The heart size and mediastinal contours are within normal limits. Mild atelectasis is seen within the bilateral lung bases. The visualized skeletal structures are unremarkable. IMPRESSION: Mild bibasilar atelectasis. Electronically Signed   By: Aram Candela M.D.   On: 08/19/2022 20:04    Procedures Procedures    Medications Ordered in ED Medications  ondansetron [facility]) injection 4 mg (4 mg Intravenous Given 08/19/22 2003)  morphine (PF) 4 MG/ML injection 4 mg (4 mg Intravenous Given 08/19/22 2003)  ketorolac (TORADOL) 30 MG/ML injection 30 mg (30 mg Intravenous Given 08/19/22 2003)  iohexol (OMNIPAQUE) 350 MG/ML injection 100 mL (100 mLs Intravenous Contrast Given 08/19/22 2040)    ED Course/ Medical Decision Making/ A&P   37 year old here for pleuritic CP over the last 2 days. Non exertional in nature. Does not radiate. No URI sx. Does not appear fluid overloaded. No hx of PE, DVT. Heart and  lungs clear. Abd soft non tender. Plan on labs, imaging and reassess.  Labs and imaging personally viewed and interpreted:  Chest xray without infection, edema, pneumothorax. CBC without leukocytosis BMP without significant findings Preg neg Trop <2 CTA chest without acute abnormality EKG without ischemic changes  Patient reassessed. Discussed labs, imaging.  I suspect her pain is likely musculoskeletal in etiology, possible costochondritis.  At this time I low suspicion for acute ACS, PE, dissection, myocarditis, pericarditis, AAA, pneumothorax, infectious process.  Unfortunately cannot take NSAIDs due to prior gastric sleeve surgery.  DC home with pain management, symptomatic management close follow-up outpatient.  Heart score 1  The patient has been appropriately medically screened and/or stabilized in the ED. I have low suspicion for any other emergent medical condition which would require further screening, evaluation or treatment in the ED or require inpatient management.  Patient is hemodynamically stable and in no acute distress.  Patient able to ambulate in department prior to ED.  Evaluation does not show acute pathology that would require ongoing or additional emergent interventions while in the emergency department or further inpatient treatment.  I have discussed the diagnosis with the patient and answered all questions.  Pain is been managed while in the emergency department and patient has no further complaints prior to discharge.  Patient is comfortable with plan discussed in room and is stable for discharge at this time.  I have discussed strict return precautions for returning to the emergency department.  Patient was encouraged to follow-up with PCP/specialist refer to at discharge.            HEART Score: 1                Medical Decision Making Amount and/or Complexity of Data Reviewed Independent Historian: friend External Data Reviewed: labs, radiology, ECG and  notes. Labs: ordered. Decision-making details documented in ED Course. Radiology: ordered and independent interpretation performed. Decision-making details documented in ED Course. ECG/medicine tests: ordered and independent interpretation performed. Decision-making details documented in ED Course.  Risk OTC drugs. Prescription drug management. Parenteral controlled substances. Decision regarding hospitalization. Diagnosis or treatment significantly limited by social determinants of health.           Final Clinical Impression(s) / ED Diagnoses Final diagnoses:  Chest wall pain    Rx / DC Orders ED Discharge Orders          Ordered  acetaminophen-codeine (TYLENOL #3) 300-30 MG tablet  Every 6 hours PRN        08/19/22 2224              Jeanette Moffatt A, PA-C 08/19/22 2229    Terrilee Files, MD 08/20/22 1059

## 2022-08-20 ENCOUNTER — Encounter: Payer: Self-pay | Admitting: Nurse Practitioner

## 2022-08-20 ENCOUNTER — Telehealth: Payer: Self-pay

## 2022-08-20 ENCOUNTER — Ambulatory Visit (INDEPENDENT_AMBULATORY_CARE_PROVIDER_SITE_OTHER): Payer: Managed Care, Other (non HMO) | Admitting: Nurse Practitioner

## 2022-08-20 VITALS — BP 120/70 | HR 75 | Temp 98.1°F | Ht 68.0 in | Wt 327.4 lb

## 2022-08-20 DIAGNOSIS — G4733 Obstructive sleep apnea (adult) (pediatric): Secondary | ICD-10-CM | POA: Diagnosis not present

## 2022-08-20 DIAGNOSIS — E1169 Type 2 diabetes mellitus with other specified complication: Secondary | ICD-10-CM

## 2022-08-20 DIAGNOSIS — Z113 Encounter for screening for infections with a predominantly sexual mode of transmission: Secondary | ICD-10-CM

## 2022-08-20 DIAGNOSIS — Z1159 Encounter for screening for other viral diseases: Secondary | ICD-10-CM

## 2022-08-20 DIAGNOSIS — R091 Pleurisy: Secondary | ICD-10-CM

## 2022-08-20 DIAGNOSIS — Z0000 Encounter for general adult medical examination without abnormal findings: Secondary | ICD-10-CM | POA: Diagnosis not present

## 2022-08-20 DIAGNOSIS — Z114 Encounter for screening for human immunodeficiency virus [HIV]: Secondary | ICD-10-CM

## 2022-08-20 DIAGNOSIS — E559 Vitamin D deficiency, unspecified: Secondary | ICD-10-CM

## 2022-08-20 DIAGNOSIS — F419 Anxiety disorder, unspecified: Secondary | ICD-10-CM

## 2022-08-20 DIAGNOSIS — Z79899 Other long term (current) drug therapy: Secondary | ICD-10-CM

## 2022-08-20 DIAGNOSIS — F32A Depression, unspecified: Secondary | ICD-10-CM

## 2022-08-20 DIAGNOSIS — R319 Hematuria, unspecified: Secondary | ICD-10-CM

## 2022-08-20 DIAGNOSIS — Z6841 Body Mass Index (BMI) 40.0 and over, adult: Secondary | ICD-10-CM

## 2022-08-20 DIAGNOSIS — Z09 Encounter for follow-up examination after completed treatment for conditions other than malignant neoplasm: Secondary | ICD-10-CM

## 2022-08-20 LAB — POCT URINALYSIS DIP (CLINITEK)
Bilirubin, UA: NEGATIVE
Glucose, UA: NEGATIVE mg/dL
Leukocytes, UA: NEGATIVE
Nitrite, UA: NEGATIVE
POC PROTEIN,UA: NEGATIVE
Spec Grav, UA: >=1.03 — AB (ref 1.010–1.025)
Urobilinogen, UA: 1 E.U./dL
pH, UA: 6 (ref 5.0–8.0)

## 2022-08-20 MED ORDER — MOUNJARO 2.5 MG/0.5ML ~~LOC~~ SOAJ
2.5000 mg | SUBCUTANEOUS | 0 refills | Status: DC
Start: 2022-08-20 — End: 2022-09-17

## 2022-08-20 MED ORDER — BUPROPION HCL ER (XL) 150 MG PO TB24: 150.0000 mg | ORAL_TABLET | ORAL | 2 refills | Status: DC

## 2022-08-20 MED ORDER — VALACYCLOVIR HCL 500 MG PO TABS: 500.0000 mg | ORAL_TABLET | Freq: Every day | ORAL | 1 refills | Status: DC

## 2022-08-20 MED ORDER — TRIAMCINOLONE ACETONIDE 40 MG/ML IJ SUSP
60.0000 mg | Freq: Once | INTRAMUSCULAR | Status: AC
Start: 2022-08-20 — End: 2022-08-20
  Administered 2022-08-20: 60 mg via INTRAMUSCULAR

## 2022-08-20 NOTE — Progress Notes (Signed)
Madelaine Bhat, CMA,acting as a Neurosurgeon for Arnette Felts, FNP.,have documented all relevant documentation on the behalf of Arnette Felts, FNP,as directed by  Arnette Felts, FNP while in the presence of Arnette Felts, FNP.  Subjective:    Patient ID: Kristin Love , female    DOB: 04/09/1985 , 37 y.o.   MRN: [id number]  Chief Complaint  Patient presents with   Annual Exam    HPI  Patient presents today for HM, patient reports compliance with medications. Continues to be followed by Dr. Osborn Coho for GYN care. Continue with [facility]. Her provider took her off Mounjaro and started phentermine. Weight 4 weeks ago was 309 lbs. She is no longer walking due to having things to do at her lunch break. She is unable to take NSAIDs due to gastric sleeve. She did not feel the chest pain and spasms  Patient denies any chest pains, SOB, or headaches. Patient report she went to the hospital last night for chest pain, EKG was preformed at hospital. Patient reports the chest pain is still present, patient reports the chest pain takes her breath. Patient reports it hurts to move, patient reports she was told she has inflammation of her chest muscle.   Patient would like to discuss her mental health today. BP Readings from Last 3 Encounters: 08/20/22 : 120/70 08/19/22 : 99/62 08/19/21 : 136/68       Past Medical History:  Diagnosis Date   Abnormal uterine bleeding (AUB) 07/15/2017   Heavy regular menses, with one episode of irregular/late timing and heavy menses 07/12/17     Acute sphenoidal sinusitis 12/07/2019   Diabetes mellitus without complication (HCC)    Herpes    PCOS (polycystic ovarian syndrome)    Vaginal Pap smear, abnormal    had colposcopy repeat pap negative     Family History  Problem Relation Age of Onset   Hypertension Mother    Hypertension Father    Diabetes Father    Diabetes Maternal Grandmother    Hypertension Maternal Grandmother    Diabetes  Maternal Grandfather    Hypertension Maternal Grandfather    Congestive Heart Failure Maternal Grandfather    Diabetes Paternal Grandmother    Hypertension Paternal Grandmother    Diabetes Paternal Grandfather    Hypertension Paternal Grandfather    Stroke Paternal Grandfather      Current Outpatient Medications:    acetaminophen-codeine (TYLENOL #3) 300-30 MG tablet, Take 1-2 tablets by mouth every 6 (six) hours as needed for moderate pain., Disp: 10 tablet, Rfl: 0   buPROPion (WELLBUTRIN XL) 150 MG 24 hr tablet, Take 1 tablet (150 mg total) by mouth every morning., Disp: 30 tablet, Rfl: 2   EPINEPHrine 0.3 mg/0.3 mL IJ SOAJ injection, Inject 0.3 mg into the muscle as needed for anaphylaxis., Disp: 1 each, Rfl: 2   phentermine 30 MG capsule, Take 30 mg by mouth daily., Disp: , Rfl:    tirzepatide (MOUNJARO) 2.5 MG/0.5ML Pen, Inject 2.5 mg into the skin once a week., Disp: 2 mL, Rfl: 0   blood glucose meter kit and supplies KIT, Dispense based on patient and insurance preference. Check blood sugar two times a day and as needed (FOR ICD-9 250.00, 250.01). (Patient not taking: Reported on 08/20/2022), Disp: 1 each, Rfl: 0   cyclobenzaprine (FLEXERIL) 10 MG tablet, Take 1 tablet (10 mg total) by mouth 3 (three) times daily as needed., Disp: 30 tablet, Rfl: 0   metroNIDAZOLE (FLAGYL) 500 MG tablet, Take  1 tablet (500 mg total) by mouth 3 (three) times daily for 10 days., Disp: 30 tablet, Rfl: 0   valACYclovir (VALTREX) 500 MG tablet, Take 1 tablet (500 mg total) by mouth daily., Disp: 90 tablet, Rfl: 1   Allergies  Allergen Reactions   Shellfish Allergy Anaphylaxis   Fish Allergy Swelling    Raw shrimp      The patient states she uses none for birth control. Patient's last menstrual period was 08/14/2022 (approximate).  Negative for Dysmenorrhea and Negative for Menorrhagia. Negative for: breast discharge, breast lump(s), breast pain and breast self exam. Associated symptoms include abnormal  vaginal bleeding. Pertinent negatives include abnormal bleeding (hematology), anxiety, decreased libido, depression, difficulty falling sleep, dyspareunia, history of infertility, nocturia, sexual dysfunction, sleep disturbances, urinary incontinence, urinary urgency, vaginal discharge and vaginal itching. Diet: reports her diet is horrible. The patient states her exercise level is minimal due to being busy, she has also been having chest discomfort since two days ago and went to ER last night.   The patient's tobacco use is:  Social History   Tobacco Use  Smoking Status Never  Smokeless Tobacco Never   She has been exposed to passive smoke. The patient's alcohol use is:  Social History   Substance and Sexual Activity  Alcohol Use No   Additional information: Letter sent to Dr. Osborn Coho for her PAP results.   Review of Systems  Constitutional: Negative.   HENT: Negative.    Eyes: Negative.   Respiratory: Negative.    Cardiovascular: Negative.   Gastrointestinal: Negative.   Endocrine: Negative.   Genitourinary: Negative.   Musculoskeletal: Negative.   Skin: Negative.   Allergic/Immunologic: Negative.   Neurological: Negative.   Hematological: Negative.   Psychiatric/Behavioral: Negative.       Today's Vitals   08/20/22 1117  BP: 120/70  Pulse: 75  Temp: 98.1 F (36.7 C)  TempSrc: Oral  Weight: (!) 327 lb 6.4 oz (148.5 kg)  Height: 5\' 8"  (1.727 m)  PainSc: 8   PainLoc: Chest   Body mass index is 49.78 kg/m.  Wt Readings from Last 3 Encounters:  08/20/22 (!) 327 lb 6.4 oz (148.5 kg)  08/19/22 (!) 325 lb (147.4 kg)  08/19/21 (!) 328 lb (148.8 kg)       08/20/2022   11:20 AM 08/19/2021    3:21 PM 01/27/2019    9:14 AM 06/25/2016    1:35 PM 09/10/2015    9:43 AM  Depression screen PHQ 2/9  Decreased Interest 1 0 0 3 3  Down, Depressed, Hopeless 2 0 0 3 3  PHQ - 2 Score 3 0 0 6 6  Altered sleeping 1   3 3   Tired, decreased energy 1   3 3   Change in  appetite 2   3 3   Feeling bad or failure about yourself  2   3 3   Trouble concentrating 3   3 1   Moving slowly or fidgety/restless 1   3 1   Suicidal thoughts    2 1  PHQ-9 Score 13   26 21   Difficult doing work/chores Somewhat difficult          08/20/2022   11:21 AM 09/10/2015    9:00 AM 08/22/2015    4:09 PM  GAD 7 : Generalized Anxiety Score  Nervous, Anxious, on Edge 1 3 1   Control/stop worrying 2 3 0  Worry too much - different things 3 3 0  Trouble relaxing 2 3 0  Restless 2  3 0  Easily annoyed or irritable 3 3 1   Afraid - awful might happen 1 2 0  Total GAD 7 Score 14 20 2   Anxiety Difficulty Not difficult at all      Objective:  Physical Exam Vitals reviewed.  Constitutional:      General: She is not in acute distress.    Appearance: Normal appearance. She is well-developed. She is obese.  HENT:     Head: Normocephalic and atraumatic.     Right Ear: Hearing, tympanic membrane, ear canal and external ear normal. There is no impacted cerumen.     Left Ear: Hearing, tympanic membrane, ear canal and external ear normal. There is no impacted cerumen.     Nose: Nose normal.     Mouth/Throat:     Mouth: Mucous membranes are moist.  Eyes:     General: Lids are normal.     Extraocular Movements: Extraocular movements intact.     Conjunctiva/sclera: Conjunctivae normal.     Pupils: Pupils are equal, round, and reactive to light.     Funduscopic exam:    Right eye: No papilledema.        Left eye: No papilledema.  Neck:     Thyroid: No thyroid mass.     Vascular: No carotid bruit.  Cardiovascular:     Rate and Rhythm: Normal rate and regular rhythm.     Pulses: Normal pulses.     Heart sounds: Normal heart sounds. No murmur heard. Pulmonary:     Effort: Pulmonary effort is normal. No respiratory distress.     Breath sounds: Normal breath sounds. No wheezing.  Chest:     Chest wall: No mass.  Breasts:    Tanner Score is 5.     Right: Normal. No mass or tenderness.      Left: Normal. No mass or tenderness.  Abdominal:     General: Abdomen is flat. Bowel sounds are normal. There is no distension.     Palpations: Abdomen is soft.     Tenderness: There is no abdominal tenderness.  Genitourinary:    Comments: Deferred - Followed by GYN Musculoskeletal:        General: No swelling. Normal range of motion.     Cervical back: Full passive range of motion without pain, normal range of motion and neck supple.     Right lower leg: No edema.     Left lower leg: No edema.  Lymphadenopathy:     Upper Body:     Right upper body: No supraclavicular, axillary or pectoral adenopathy.     Left upper body: No supraclavicular, axillary or pectoral adenopathy.  Skin:    General: Skin is warm and dry.     Capillary Refill: Capillary refill takes less than 2 seconds.  Neurological:     General: No focal deficit present.     Mental Status: She is alert and oriented to person, place, and time.     Cranial Nerves: No cranial nerve deficit.     Sensory: No sensory deficit.     Motor: No weakness.  Psychiatric:        Mood and Affect: Mood normal.        Behavior: Behavior normal.        Thought Content: Thought content normal.        Judgment: Judgment normal.       Assessment And Plan:     Encounter for annual health examination Assessment & Plan: Behavior modifications discussed  and diet history reviewed.   Pt will continue to exercise regularly and modify diet with low GI, plant based foods and decrease intake of processed foods.  Recommend intake of daily multivitamin, Vitamin D, and calcium.  Recommend self breast exams for preventive screenings, as well as recommend immunizations that include influenza, TDAP    Screening for STDs (sexually transmitted diseases) -     NuSwab Vaginitis Plus (VG+) -     RPR -     T pallidum Screening [facility] -     Hepatitis B surface antibody,qualitative  Encounter for HIV (human immunodeficiency virus) test -      HIV Antibody (routine testing w rflx)  Encounter for hepatitis C screening test for low risk patient Assessment & Plan: Will check Hepatitis C screening due to recent recommendations to screen all adults 18 years and older   Orders: -     Hepatitis C antibody  Class 3 severe obesity due to excess calories with body mass index (BMI) of 45.0 to 49.9 in adult, unspecified whether serious comorbidity present [facility]) Assessment & Plan: She has gained approximately 30 lbs since she stopped her Mounjaro 4 weeks ago. Goal to exercise 150 minutes per week with at least 2 days of strength training Encouraged to park further when at the store, take stairs instead of elevators and to walk in place during commercials. Increase water intake to at least one gallon of water daily.    Type 2 diabetes mellitus with obesity (HCC) Assessment & Plan: Chronic, controlled Will restart her Mounjaro at 2.5 mg weekly Encouraged to limit intake of sugary foods and drinks Encouraged to increase physical activity to 150 minutes per week Diabetic foot exam done, decreased sensation bilateral heels has thickened skin  Eye exam not up to date - referral sent to opthalmology Suggestion is made for her to avoid simple carbohydrates from her diet including Cakes, Sweet Desserts, Ice Cream, Soda (diet and regular), Sweet Tea, Candies, Chips, Cookies, Store Bought Juices, Alcohol in Excess of 1-2 drinks a day, Artificial Sweeteners, Coffee Creamer, and "Sugar-free" Products. This will help patient to have more stable blood glucose    Orders: -     Microalbumin / creatinine urine ratio -     POCT URINALYSIS DIP (CLINITEK) -     CMP14+EGFR -     Hemoglobin A1c -     Lipid panel -     Ambulatory referral to Ophthalmology -     Greggory Keen; Inject 2.5 mg into the skin once a week.  Dispense: 2 mL; Refill: 0  Vitamin D deficiency Assessment & Plan: Will check vitamin D level and supplement as needed.    Also encouraged  to spend 15 minutes in the sun daily.    Orders: -     VITAMIN D 25 Hydroxy (Vit-D Deficiency, Fractures)  Pleuritis Assessment & Plan: She has pain to right chest mostly when lying down, will send muscle relaxer and I have given her a dose of Kenalog injection.   Orders: -     Triamcinolone Acetonide  Hospital discharge follow-up Assessment & Plan: TCM Performed. A member of the clinical team spoke with the patient upon dischare. Discharge summary was reviewed in full detail during the visit. Meds reconciled and compared to discharge meds. Medication list is updated and reviewed with the patient.  Greater than 50% face to face time was spent in counseling an coordination of care.  All questions were answered to the satisfaction of the patient.  Severe obstructive sleep apnea Assessment & Plan: She is not using a CPAP at this time.    Anxiety and depression Assessment & Plan: Will refer to psychiatry and psychology again. Depression screen score 13 and GAD is 14, will start her on Wellbutrin, discussed side effects. This will likely also help with her weight loss.   Orders: -     Ambulatory referral to Psychiatry -     Ambulatory referral to Psychology -     buPROPion HCl ER (XL); Take 1 tablet (150 mg total) by mouth every morning.  Dispense: 30 tablet; Refill: 2  Hematuria, unspecified type Assessment & Plan: Moderate blood in urine, will send urine culture  Orders: -     Urine Culture  Other long term (current) drug therapy -     CBC with Differential/Platelet  Other orders -     valACYclovir HCl; Take 1 tablet (500 mg total) by mouth daily.  Dispense: 90 tablet; Refill: 1   Return for 1 year physical, controlled DM check 4 months; 4 week med check.  Patient was given opportunity to ask questions. Patient verbalized understanding of the plan and was able to repeat key elements of the plan. All questions were answered to their satisfaction.   Arnette Felts,  FNP  I, Arnette Felts, FNP, have reviewed all documentation for this visit. The documentation on 08/20/22 for the exam, diagnosis, procedures, and orders are all accurate and complete.

## 2022-08-20 NOTE — Assessment & Plan Note (Signed)
She is not using a CPAP at this time.

## 2022-08-20 NOTE — Assessment & Plan Note (Signed)
She has gained approximately 30 lbs since she stopped her Mounjaro 4 weeks ago. Goal to exercise 150 minutes per week with at least 2 days of strength training Encouraged to park further when at the store, take stairs instead of elevators and to walk in place during commercials. Increase water intake to at least one gallon of water daily.

## 2022-08-20 NOTE — Assessment & Plan Note (Signed)
TCM Performed. A member of the clinical team spoke with the patient upon dischare. Discharge summary was reviewed in full detail during the visit. Meds reconciled and compared to discharge meds. Medication list is updated and reviewed with the patient.  Greater than 50% face to face time was spent in counseling an coordination of care.  All questions were answered to the satisfaction of the patient.

## 2022-08-20 NOTE — Assessment & Plan Note (Signed)
Will check vitamin D level and supplement as needed.    Also encouraged to spend 15 minutes in the sun daily.   

## 2022-08-20 NOTE — Assessment & Plan Note (Signed)
Chronic, controlled Will restart her Mounjaro at 2.5 mg weekly Encouraged to limit intake of sugary foods and drinks Encouraged to increase physical activity to 150 minutes per week Diabetic foot exam done, decreased sensation bilateral heels has thickened skin  Eye exam not up to date - referral sent to opthalmology Suggestion is made for her to avoid simple carbohydrates from her diet including Cakes, Sweet Desserts, Ice Cream, Soda (diet and regular), Sweet Tea, Candies, Chips, Cookies, Store Bought Juices, Alcohol in Excess of 1-2 drinks a day, Artificial Sweeteners, Coffee Creamer, and "Sugar-free" Products. This will help patient to have more stable blood glucose

## 2022-08-20 NOTE — Transitions of Care (Post Inpatient/ED Visit) (Signed)
   08/20/2022  Name: SHAYLIE EKLUND MRN: [id number] DOB: 1985-09-13  Today's TOC FU Call Status: Today's TOC FU Call Status:: Successful TOC FU Call Competed TOC FU Call Complete Date: 08/20/22  Transition Care Management Follow-up Telephone Call Date of Discharge: 08/19/22 Discharge Facility: [facility] Type of Discharge: Inpatient Admission Primary Inpatient Discharge Diagnosis:: chest pain How have you been since you were released from the hospital?: Same Any questions or concerns?: No  Items Reviewed: Did you receive and understand the discharge instructions provided?: Yes Medications obtained,verified, and reconciled?: Yes (Medications Reviewed) Any new allergies since your discharge?: No Dietary orders reviewed?: No Do you have support at home?: Yes People in Home: child(ren), dependent, parent(s)  Medications Reviewed Today: Medications Reviewed Today   Medications were not reviewed in this encounter     Home Care and Equipment/Supplies: Were Home Health Services Ordered?: No Any new equipment or medical supplies ordered?: No  Functional Questionnaire: Do you need assistance with bathing/showering or dressing?: No Do you need assistance with meal preparation?: No Do you need assistance with eating?: No Do you have difficulty maintaining continence: No Do you need assistance with getting out of bed/getting out of a chair/moving?: No Do you have difficulty managing or taking your medications?: No  Follow up appointments reviewed: PCP Follow-up appointment confirmed?: Yes Date of PCP follow-up appointment?: 08/20/22 Follow-up Provider: [facility] Follow-up appointment confirmed?: No Do you need transportation to your follow-up appointment?: No Do you understand care options if your condition(s) worsen?: Yes-patient verbalized understanding    SIGNATURE Lisabeth Devoid, CMA

## 2022-08-20 NOTE — Assessment & Plan Note (Signed)
Behavior modifications discussed and diet history reviewed.   Pt will continue to exercise regularly and modify diet with low GI, plant based foods and decrease intake of processed foods.  Recommend intake of daily multivitamin, Vitamin D, and calcium.  Recommend self breast exams for preventive screenings, as well as recommend immunizations that include influenza, TDAP

## 2022-08-20 NOTE — Assessment & Plan Note (Signed)
She has pain to right chest mostly when lying down, will send muscle relaxer and I have given her a dose of Kenalog injection.

## 2022-08-20 NOTE — Patient Instructions (Signed)
[facility] - counseling Address: [address], [place], [place] [address] Phone: [phone]

## 2022-08-20 NOTE — Assessment & Plan Note (Signed)
Will refer to psychiatry and psychology again. Depression screen score 13 and GAD is 14, will start her on Wellbutrin, discussed side effects. This will likely also help with her weight loss.

## 2022-08-21 LAB — CBC WITH DIFFERENTIAL/PLATELET
Basophils Absolute: 0 10*3/uL (ref 0.0–0.2)
Basos: 0 %
EOS (ABSOLUTE): 0.2 10*3/uL (ref 0.0–0.4)
Hematocrit: 36.2 % (ref 34.0–46.6)
Hemoglobin: 11.8 g/dL (ref 11.1–15.9)
Immature Grans (Abs): 0 10*3/uL (ref 0.0–0.1)
Immature Granulocytes: 0 %
Lymphocytes Absolute: 1.9 10*3/uL (ref 0.7–3.1)
Lymphs: 24 %
MCH: 28.2 pg (ref 26.6–33.0)
MCHC: 32.6 g/dL (ref 31.5–35.7)
MCV: 86 fL (ref 79–97)
Monocytes Absolute: 0.6 10*3/uL (ref 0.1–0.9)
Monocytes: 8 %
Neutrophils Absolute: 5 10*3/uL (ref 1.4–7.0)
Neutrophils: 65 %
Platelets: 285 10*3/uL (ref 150–450)
RBC: 4.19 x10E6/uL (ref 3.77–5.28)
RDW: 12.5 % (ref 11.7–15.4)
WBC: 7.7 10*3/uL (ref 3.4–10.8)

## 2022-08-21 LAB — LIPID PANEL
Chol/HDL Ratio: 3.1 ratio (ref 0.0–4.4)
Cholesterol, Total: 192 mg/dL (ref 100–199)
HDL: 62 mg/dL (ref >39)
LDL Chol Calc (NIH): 120 mg/dL — ABNORMAL HIGH (ref 0–99)
Triglycerides: 51 mg/dL (ref 0–149)
VLDL Cholesterol Cal: 10 mg/dL (ref 5–40)

## 2022-08-21 LAB — CMP14+EGFR
ALT: 17 IU/L (ref 0–32)
Albumin: 3.6 g/dL — ABNORMAL LOW (ref 3.9–4.9)
Alkaline Phosphatase: 78 IU/L (ref 44–121)
BUN/Creatinine Ratio: 16 (ref 9–23)
BUN: 9 mg/dL (ref 6–20)
Bilirubin Total: 0.3 mg/dL (ref 0.0–1.2)
CO2: 22 mmol/L (ref 20–29)
Calcium: 8.8 mg/dL (ref 8.7–10.2)
Chloride: 106 mmol/L (ref 96–106)
Creatinine, Ser: 0.56 mg/dL — ABNORMAL LOW (ref 0.57–1.00)
Globulin, Total: 3.5 g/dL (ref 1.5–4.5)
Glucose: 87 mg/dL (ref 70–99)
Potassium: 4.5 mmol/L (ref 3.5–5.2)
Sodium: 140 mmol/L (ref 134–144)
Total Protein: 7.1 g/dL (ref 6.0–8.5)
eGFR: 121 mL/min/{1.73_m2} (ref >59)

## 2022-08-21 LAB — MICROALBUMIN / CREATININE URINE RATIO
Creatinine, Urine: 153.9 mg/dL
Microalb/Creat Ratio: 28 mg/g creat (ref 0–29)
Microalbumin, Urine: 42.6 ug/mL

## 2022-08-21 LAB — HEPATITIS C ANTIBODY: Hep C Virus Ab: NONREACTIVE

## 2022-08-21 LAB — VITAMIN D 25 HYDROXY (VIT D DEFICIENCY, FRACTURES): Vit D, 25-Hydroxy: 29 ng/mL — ABNORMAL LOW (ref 30.0–100.0)

## 2022-08-21 LAB — RPR: RPR Ser Ql: NONREACTIVE

## 2022-08-21 LAB — HEMOGLOBIN A1C: Est. average glucose Bld gHb Est-mCnc: 114 mg/dL

## 2022-08-21 LAB — URINE CULTURE

## 2022-08-21 LAB — HEPATITIS B SURFACE ANTIBODY,QUALITATIVE: Hep B Surface Ab, Qual: REACTIVE

## 2022-08-21 LAB — T PALLIDUM SCREENING CASCADE: T pallidum Antibodies (TP-PA): NONREACTIVE

## 2022-08-22 ENCOUNTER — Encounter: Payer: Self-pay | Admitting: Nurse Practitioner

## 2022-08-25 ENCOUNTER — Other Ambulatory Visit: Payer: Self-pay | Admitting: Nurse Practitioner

## 2022-08-25 MED ORDER — METRONIDAZOLE 500 MG PO TABS: 500.0000 mg | ORAL_TABLET | Freq: Three times a day (TID) | ORAL | 0 refills | Status: AC

## 2022-08-25 MED ORDER — CYCLOBENZAPRINE HCL 10 MG PO TABS: 10.0000 mg | ORAL_TABLET | Freq: Three times a day (TID) | ORAL | 0 refills | Status: DC | PRN

## 2022-08-31 DIAGNOSIS — Z114 Encounter for screening for human immunodeficiency virus [HIV]: Secondary | ICD-10-CM | POA: Insufficient documentation

## 2022-08-31 DIAGNOSIS — Z1159 Encounter for screening for other viral diseases: Secondary | ICD-10-CM | POA: Insufficient documentation

## 2022-08-31 DIAGNOSIS — R319 Hematuria, unspecified: Secondary | ICD-10-CM | POA: Insufficient documentation

## 2022-08-31 DIAGNOSIS — Z113 Encounter for screening for infections with a predominantly sexual mode of transmission: Secondary | ICD-10-CM | POA: Insufficient documentation

## 2022-08-31 NOTE — Assessment & Plan Note (Signed)
Will check Hepatitis C screening due to recent recommendations to screen all adults 18 years and older

## 2022-08-31 NOTE — Assessment & Plan Note (Signed)
Moderate blood in urine, will send urine culture

## 2022-09-09 ENCOUNTER — Other Ambulatory Visit: Payer: Self-pay | Admitting: Nurse Practitioner

## 2022-09-09 ENCOUNTER — Other Ambulatory Visit: Payer: Managed Care, Other (non HMO)

## 2022-09-09 DIAGNOSIS — Z111 Encounter for screening for respiratory tuberculosis: Secondary | ICD-10-CM

## 2022-09-09 NOTE — Progress Notes (Signed)
Ordered tb quantiferon.

## 2022-09-17 ENCOUNTER — Ambulatory Visit: Payer: Managed Care, Other (non HMO) | Admitting: Nurse Practitioner

## 2022-09-17 ENCOUNTER — Other Ambulatory Visit: Payer: Self-pay

## 2022-09-17 ENCOUNTER — Other Ambulatory Visit: Payer: Self-pay | Admitting: Nurse Practitioner

## 2022-09-17 DIAGNOSIS — E1169 Type 2 diabetes mellitus with other specified complication: Secondary | ICD-10-CM

## 2022-09-17 NOTE — Progress Notes (Deleted)
Madelaine Bhat, CMA,acting as a Neurosurgeon for Arnette Felts, FNP.,have documented all relevant documentation on the behalf of Arnette Felts, FNP,as directed by  Arnette Felts, FNP while in the presence of Arnette Felts, FNP.  Subjective:  Patient ID: Kristin Love , female    DOB: [birth year] , 37 y.o.   MRN: [id number]  No chief complaint on file.   HPI  Patient presents today for a anxiety and depression follow up, patient reports compliance with medications. Patient denies any chest pain, SOB, or headaches. Patient has no concerns today. Patient started buPROPion 150mg  at last visit, patient reports it is    Past Medical History:  Diagnosis Date  . Abnormal uterine bleeding (AUB) 07/15/2017   Heavy regular menses, with one episode of irregular/late timing and heavy menses 07/12/17    . Acute sphenoidal sinusitis 12/07/2019  . Diabetes mellitus without complication (HCC)   . Herpes   . PCOS (polycystic ovarian syndrome)   . Vaginal Pap smear, abnormal    had colposcopy repeat pap negative     Family History  Problem Relation Age of Onset  . Hypertension Mother   . Hypertension Father   . Diabetes Father   . Diabetes Maternal Grandmother   . Hypertension Maternal Grandmother   . Diabetes Maternal Grandfather   . Hypertension Maternal Grandfather   . Congestive Heart Failure Maternal Grandfather   . Diabetes Paternal Grandmother   . Hypertension Paternal Grandmother   . Diabetes Paternal Grandfather   . Hypertension Paternal Grandfather   . Stroke Paternal Grandfather      Current Outpatient Medications:  .  acetaminophen-codeine (TYLENOL #3) 300-30 MG tablet, Take 1-2 tablets by mouth every 6 (six) hours as needed for moderate pain., Disp: 10 tablet, Rfl: 0 .  blood glucose meter kit and supplies KIT, Dispense based on patient and insurance preference. Check blood sugar two times a day and as needed (FOR ICD-9 250.00, 250.01). (Patient not taking: Reported on 08/20/2022), Disp:  1 each, Rfl: 0 .  buPROPion (WELLBUTRIN XL) 150 MG 24 hr tablet, Take 1 tablet (150 mg total) by mouth every morning., Disp: 30 tablet, Rfl: 2 .  cyclobenzaprine (FLEXERIL) 10 MG tablet, Take 1 tablet (10 mg total) by mouth 3 (three) times daily as needed., Disp: 30 tablet, Rfl: 0 .  EPINEPHrine 0.3 mg/0.3 mL IJ SOAJ injection, Inject 0.3 mg into the muscle as needed for anaphylaxis., Disp: 1 each, Rfl: 2 .  phentermine 30 MG capsule, Take 30 mg by mouth daily., Disp: , Rfl:  .  tirzepatide (MOUNJARO) 2.5 MG/0.5ML Pen, Inject 2.5 mg into the skin once a week., Disp: 2 mL, Rfl: 0 .  valACYclovir (VALTREX) 500 MG tablet, Take 1 tablet (500 mg total) by mouth daily., Disp: 90 tablet, Rfl: 1   Allergies  Allergen Reactions  . Shellfish Allergy Anaphylaxis  . Fish Allergy Swelling    Raw shrimp     Review of Systems   There were no vitals filed for this visit. There is no height or weight on file to calculate BMI.  Wt Readings from Last 3 Encounters:  08/20/22 (!) 327 lb 6.4 oz (148.5 kg)  08/19/22 (!) 325 lb (147.4 kg)  08/19/21 (!) 328 lb (148.8 kg)    The ASCVD Risk score (Arnett DK, et al., 2019) failed to calculate for the following reasons:   The 2019 ASCVD risk score is only valid for ages 25 to 38  Objective:  Physical Exam  Assessment And Plan:  Anxiety and depression    No follow-ups on file.  Patient was given opportunity to ask questions. Patient verbalized understanding of the plan and was able to repeat key elements of the plan. All questions were answered to their satisfaction.    Jeanell Sparrow, FNP, have reviewed all documentation for this visit. The documentation on 09/17/22 for the exam, diagnosis, procedures, and orders are all accurate and complete.   IF YOU HAVE BEEN REFERRED TO A SPECIALIST, IT MAY TAKE 1-2 WEEKS TO SCHEDULE/PROCESS THE REFERRAL. IF YOU HAVE NOT HEARD FROM US/SPECIALIST IN TWO WEEKS, PLEASE GIVE [place] A CALL AT [phone].

## 2022-10-13 NOTE — Progress Notes (Unsigned)
Madelaine Bhat, CMA,acting as a Neurosurgeon for Arnette Felts, FNP.,have documented all relevant documentation on the behalf of Arnette Felts, FNP,as directed by  Arnette Felts, FNP while in the presence of Arnette Felts, FNP.  Subjective:  Patient ID: Kristin Love , female    DOB: April 01, 1985 , 37 y.o.   MRN: [id number]  No chief complaint on file.   HPI  Patient presents today for a DM  follow up, patient reports compliance with medications. Patient denies any chest pain, SOB, or headaches. Patient has no concerns today.     Past Medical History:  Diagnosis Date  . Abnormal uterine bleeding (AUB) 07/15/2017   Heavy regular menses, with one episode of irregular/late timing and heavy menses 07/12/17    . Acute sphenoidal sinusitis 12/07/2019  . Diabetes mellitus without complication (HCC)   . Herpes   . PCOS (polycystic ovarian syndrome)   . Vaginal Pap smear, abnormal    had colposcopy repeat pap negative     Family History  Problem Relation Age of Onset  . Hypertension Mother   . Hypertension Father   . Diabetes Father   . Diabetes Maternal Grandmother   . Hypertension Maternal Grandmother   . Diabetes Maternal Grandfather   . Hypertension Maternal Grandfather   . Congestive Heart Failure Maternal Grandfather   . Diabetes Paternal Grandmother   . Hypertension Paternal Grandmother   . Diabetes Paternal Grandfather   . Hypertension Paternal Grandfather   . Stroke Paternal Grandfather      Current Outpatient Medications:  .  acetaminophen-codeine (TYLENOL #3) 300-30 MG tablet, Take 1-2 tablets by mouth every 6 (six) hours as needed for moderate pain., Disp: 10 tablet, Rfl: 0 .  blood glucose meter kit and supplies KIT, Dispense based on patient and insurance preference. Check blood sugar two times a day and as needed (FOR ICD-9 250.00, 250.01). (Patient not taking: Reported on 08/20/2022), Disp: 1 each, Rfl: 0 .  buPROPion (WELLBUTRIN XL) 150 MG 24 hr tablet, Take 1 tablet (150  mg total) by mouth every morning., Disp: 30 tablet, Rfl: 2 .  cyclobenzaprine (FLEXERIL) 10 MG tablet, Take 1 tablet (10 mg total) by mouth 3 (three) times daily as needed., Disp: 30 tablet, Rfl: 0 .  EPINEPHrine 0.3 mg/0.3 mL IJ SOAJ injection, Inject 0.3 mg into the muscle as needed for anaphylaxis., Disp: 1 each, Rfl: 2 .  phentermine 30 MG capsule, Take 30 mg by mouth daily., Disp: , Rfl:  .  tirzepatide (MOUNJARO) 2.5 MG/0.5ML Pen, INJECT 2.5 MG SUBCUTANEOUSLY WEEKLY, Disp: 2 mL, Rfl: 1 .  valACYclovir (VALTREX) 500 MG tablet, Take 1 tablet (500 mg total) by mouth daily., Disp: 90 tablet, Rfl: 1   Allergies  Allergen Reactions  . Shellfish Allergy Anaphylaxis  . Fish Allergy Swelling    Raw shrimp     Review of Systems   There were no vitals filed for this visit. There is no height or weight on file to calculate BMI.  Wt Readings from Last 3 Encounters:  08/20/22 (!) 327 lb 6.4 oz (148.5 kg)  08/19/22 (!) 325 lb (147.4 kg)  08/19/21 (!) 328 lb (148.8 kg)    The ASCVD Risk score (Arnett DK, et al., 2019) failed to calculate for the following reasons:   The 2019 ASCVD risk score is only valid for ages 40 to 3  Objective:  Physical Exam      Assessment And Plan:  Type 2 diabetes mellitus with obesity (HCC)  Anxiety and  depression    No follow-ups on file.  Patient was given opportunity to ask questions. Patient verbalized understanding of the plan and was able to repeat key elements of the plan. All questions were answered to their satisfaction.    Jeanell Sparrow, FNP, have reviewed all documentation for this visit. The documentation on 10/13/22 for the exam, diagnosis, procedures, and orders are all accurate and complete.   IF YOU HAVE BEEN REFERRED TO A SPECIALIST, IT MAY TAKE 1-2 WEEKS TO SCHEDULE/PROCESS THE REFERRAL. IF YOU HAVE NOT HEARD FROM US/SPECIALIST IN TWO WEEKS, PLEASE GIVE [place] A CALL AT [phone].

## 2022-10-15 ENCOUNTER — Ambulatory Visit: Payer: Managed Care, Other (non HMO) | Admitting: Nurse Practitioner

## 2022-10-15 DIAGNOSIS — F419 Anxiety disorder, unspecified: Secondary | ICD-10-CM

## 2022-10-15 DIAGNOSIS — E1169 Type 2 diabetes mellitus with other specified complication: Secondary | ICD-10-CM

## 2022-11-03 ENCOUNTER — Encounter: Payer: Self-pay | Admitting: Nurse Practitioner

## 2022-11-25 ENCOUNTER — Other Ambulatory Visit: Payer: Self-pay

## 2022-11-25 DIAGNOSIS — E1169 Type 2 diabetes mellitus with other specified complication: Secondary | ICD-10-CM

## 2022-11-25 MED ORDER — MOUNJARO 2.5 MG/0.5ML ~~LOC~~ SOAJ
2.5000 mg | SUBCUTANEOUS | 1 refills | Status: DC
Start: 2022-11-25 — End: 2023-02-11

## 2022-12-01 ENCOUNTER — Encounter: Payer: Self-pay | Admitting: Nurse Practitioner

## 2022-12-22 NOTE — Progress Notes (Unsigned)
Madelaine Bhat, CMA,acting as a Neurosurgeon for Arnette Felts, FNP.,have documented all relevant documentation on the behalf of Arnette Felts, FNP,as directed by  Arnette Felts, FNP while in the presence of Arnette Felts, FNP.  Subjective:  Patient ID: Kristin Love , female    DOB: [birth year] , 37 y.o.   MRN: [id number]  No chief complaint on file.   HPI  Patient presents today for a dm follow up, Patient reports compliance with medication. Patient denies any chest pain, SOB, or headaches. Patient has no concerns today.     Past Medical History:  Diagnosis Date  . Abnormal uterine bleeding (AUB) 07/15/2017   Heavy regular menses, with one episode of irregular/late timing and heavy menses 07/12/17    . Acute sphenoidal sinusitis 12/07/2019  . Diabetes mellitus without complication (HCC)   . Herpes   . PCOS (polycystic ovarian syndrome)   . Vaginal Pap smear, abnormal    had colposcopy repeat pap negative     Family History  Problem Relation Age of Onset  . Hypertension Mother   . Hypertension Father   . Diabetes Father   . Diabetes Maternal Grandmother   . Hypertension Maternal Grandmother   . Diabetes Maternal Grandfather   . Hypertension Maternal Grandfather   . Congestive Heart Failure Maternal Grandfather   . Diabetes Paternal Grandmother   . Hypertension Paternal Grandmother   . Diabetes Paternal Grandfather   . Hypertension Paternal Grandfather   . Stroke Paternal Grandfather      Current Outpatient Medications:  .  acetaminophen-codeine (TYLENOL #3) 300-30 MG tablet, Take 1-2 tablets by mouth every 6 (six) hours as needed for moderate pain., Disp: 10 tablet, Rfl: 0 .  blood glucose meter kit and supplies KIT, Dispense based on patient and insurance preference. Check blood sugar two times a day and as needed (FOR ICD-9 250.00, 250.01). (Patient not taking: Reported on 08/20/2022), Disp: 1 each, Rfl: 0 .  buPROPion (WELLBUTRIN XL) 150 MG 24 hr tablet, Take 1 tablet (150 mg  total) by mouth every morning., Disp: 30 tablet, Rfl: 2 .  cyclobenzaprine (FLEXERIL) 10 MG tablet, Take 1 tablet (10 mg total) by mouth 3 (three) times daily as needed., Disp: 30 tablet, Rfl: 0 .  EPINEPHrine 0.3 mg/0.3 mL IJ SOAJ injection, Inject 0.3 mg into the muscle as needed for anaphylaxis., Disp: 1 each, Rfl: 2 .  phentermine 30 MG capsule, Take 30 mg by mouth daily., Disp: , Rfl:  .  tirzepatide (MOUNJARO) 2.5 MG/0.5ML Pen, Inject 2.5 mg into the skin once a week., Disp: 2 mL, Rfl: 1 .  valACYclovir (VALTREX) 500 MG tablet, Take 1 tablet (500 mg total) by mouth daily., Disp: 90 tablet, Rfl: 1   Allergies  Allergen Reactions  . Shellfish Allergy Anaphylaxis  . Fish Allergy Swelling    Raw shrimp     Review of Systems   There were no vitals filed for this visit. There is no height or weight on file to calculate BMI.  Wt Readings from Last 3 Encounters:  08/20/22 (!) 327 lb 6.4 oz (148.5 kg)  08/19/22 (!) 325 lb (147.4 kg)  08/19/21 (!) 328 lb (148.8 kg)    The ASCVD Risk score (Arnett DK, et al., 2019) failed to calculate for the following reasons:   The 2019 ASCVD risk score is only valid for ages 74 to 16  Objective:  Physical Exam      Assessment And Plan:  Type 2 diabetes mellitus with obesity (HCC)  No follow-ups on file.  Patient was given opportunity to ask questions. Patient verbalized understanding of the plan and was able to repeat key elements of the plan. All questions were answered to their satisfaction.    Jeanell Sparrow, FNP, have reviewed all documentation for this visit. The documentation on 12/22/22 for the exam, diagnosis, procedures, and orders are all accurate and complete.   IF YOU HAVE BEEN REFERRED TO A SPECIALIST, IT MAY TAKE 1-2 WEEKS TO SCHEDULE/PROCESS THE REFERRAL. IF YOU HAVE NOT HEARD FROM US/SPECIALIST IN TWO WEEKS, PLEASE GIVE [place] A CALL AT [phone].

## 2022-12-23 ENCOUNTER — Ambulatory Visit: Payer: Managed Care, Other (non HMO) | Admitting: Nurse Practitioner

## 2022-12-23 DIAGNOSIS — E1169 Type 2 diabetes mellitus with other specified complication: Secondary | ICD-10-CM

## 2022-12-25 ENCOUNTER — Other Ambulatory Visit: Payer: Self-pay | Admitting: Nurse Practitioner

## 2022-12-25 DIAGNOSIS — E669 Obesity, unspecified: Secondary | ICD-10-CM

## 2022-12-25 DIAGNOSIS — E1169 Type 2 diabetes mellitus with other specified complication: Secondary | ICD-10-CM

## 2023-01-01 ENCOUNTER — Encounter: Payer: Self-pay | Admitting: Family Medicine

## 2023-01-01 ENCOUNTER — Ambulatory Visit (INDEPENDENT_AMBULATORY_CARE_PROVIDER_SITE_OTHER): Payer: Managed Care, Other (non HMO) | Admitting: Family Medicine

## 2023-01-01 VITALS — BP 120/82 | HR 102 | Temp 98.1°F | Ht 68.0 in | Wt 328.0 lb

## 2023-01-01 DIAGNOSIS — R7303 Prediabetes: Secondary | ICD-10-CM | POA: Diagnosis not present

## 2023-01-01 DIAGNOSIS — Z6841 Body Mass Index (BMI) 40.0 and over, adult: Secondary | ICD-10-CM

## 2023-01-01 DIAGNOSIS — E1169 Type 2 diabetes mellitus with other specified complication: Secondary | ICD-10-CM

## 2023-01-01 DIAGNOSIS — E66813 Obesity, class 3: Secondary | ICD-10-CM

## 2023-01-01 DIAGNOSIS — J069 Acute upper respiratory infection, unspecified: Secondary | ICD-10-CM

## 2023-01-01 MED ORDER — AZITHROMYCIN 250 MG PO TABS: ORAL_TABLET | ORAL | 0 refills | Status: AC

## 2023-01-01 MED ORDER — BENZONATATE 100 MG PO CAPS
100.0000 mg | ORAL_CAPSULE | Freq: Three times a day (TID) | ORAL | 1 refills | Status: AC | PRN
Start: 2023-01-01 — End: 2024-01-01

## 2023-01-01 NOTE — Progress Notes (Signed)
I,Jameka J Llittleton, CMA,acting as a Neurosurgeon for [employer], NP.,have documented all relevant documentation on the behalf of Ellender Hose, NP,as directed by  Ellender Hose, NP while in the presence of Ellender Hose, NP.  Subjective:  Patient ID: Kristin Love , female    DOB: 1985-07-01 , 37 y.o.   MRN: [id number]  Chief Complaint  Patient presents with   Cough   Diabetes    HPI  Patient presents today for a cough that started 3 weeks ago. Patient states she got better, then got worse again. She felt she had a fever about 1 week ago but denies any fever now. Patient denies having chest pain, shortness of breathe or  and headaches at this time, just this "bad cough". Patient will be getting her labs today also.     Past Medical History:  Diagnosis Date   Abnormal uterine bleeding (AUB) 07/15/2017   Heavy regular menses, with one episode of irregular/late timing and heavy menses 07/12/17     Acute sphenoidal sinusitis 12/07/2019   Diabetes mellitus without complication (HCC)    Herpes    PCOS (polycystic ovarian syndrome)    Vaginal Pap smear, abnormal    had colposcopy repeat pap negative     Family History  Problem Relation Age of Onset   Hypertension Mother    Hypertension Father    Diabetes Father    Diabetes Maternal Grandmother    Hypertension Maternal Grandmother    Diabetes Maternal Grandfather    Hypertension Maternal Grandfather    Congestive Heart Failure Maternal Grandfather    Diabetes Paternal Grandmother    Hypertension Paternal Grandmother    Diabetes Paternal Grandfather    Hypertension Paternal Grandfather    Stroke Paternal Grandfather      Current Outpatient Medications:    acetaminophen-codeine (TYLENOL #3) 300-30 MG tablet, Take 1-2 tablets by mouth every 6 (six) hours as needed for moderate pain., Disp: 10 tablet, Rfl: 0   benzonatate (TESSALON PERLES) 100 MG capsule, Take 1 capsule (100 mg total) by mouth 3 (three) times daily as needed for  cough., Disp: 30 capsule, Rfl: 1   blood glucose meter kit and supplies KIT, Dispense based on patient and insurance preference. Check blood sugar two times a day and as needed (FOR ICD-9 250.00, 250.01)., Disp: 1 each, Rfl: 0   EPINEPHrine 0.3 mg/0.3 mL IJ SOAJ injection, Inject 0.3 mg into the muscle as needed for anaphylaxis., Disp: 1 each, Rfl: 2   phentermine 30 MG capsule, Take 30 mg by mouth daily., Disp: , Rfl:    tirzepatide (MOUNJARO) 2.5 MG/0.5ML Pen, Inject 2.5 mg into the skin once a week., Disp: 2 mL, Rfl: 1   buPROPion (WELLBUTRIN XL) 150 MG 24 hr tablet, Take 1 tablet (150 mg total) by mouth every morning. (Patient not taking: Reported on 01/01/2023), Disp: 30 tablet, Rfl: 2   Allergies  Allergen Reactions   Shellfish Allergy Anaphylaxis   Fish Allergy Swelling    Raw shrimp     Review of Systems  Constitutional: Negative.  Negative for fever.  HENT:  Positive for congestion and mouth sores.   Respiratory:  Positive for cough.   Cardiovascular: Negative.   Endocrine: Negative for polydipsia, polyphagia and polyuria.  Genitourinary: Negative.   Neurological: Negative.      Today's Vitals   01/01/23 1606  BP: 120/82  Pulse: (!) 102  Temp: 98.1 F (36.7 C)  Weight: (!) 328 lb (148.8 kg)  Height: 5\' 8"  (1.727 m)  PainSc: 0-No pain   Body mass index is 49.87 kg/m.  Wt Readings from Last 3 Encounters:  01/01/23 (!) 328 lb (148.8 kg)  08/20/22 (!) 327 lb 6.4 oz (148.5 kg)  08/19/22 (!) 325 lb (147.4 kg)    The ASCVD Risk score (Arnett DK, et al., 2019) failed to calculate for the following reasons:   The 2019 ASCVD risk score is only valid for ages 42 to 100  Objective:  Physical Exam HENT:     Head: Normocephalic.  Pulmonary:     Effort: Pulmonary effort is normal.     Breath sounds: Normal breath sounds.  Abdominal:     Tenderness: There is no abdominal tenderness.  Skin:    General: Skin is dry.     Findings: No lesion or rash.  Neurological:      Mental Status: She is alert.         Assessment And Plan:  Prediabetes Assessment & Plan: Gave sample of Mounjaro  Orders: -     BMP8+eGFR -     CBC -     Hemoglobin A1c  URI with cough and congestion -     Benzonatate; Take 1 capsule (100 mg total) by mouth 3 (three) times daily as needed for cough.  Dispense: 30 capsule; Refill: 1 -     Azithromycin; Take 2 tablets (500 mg) on  Day 1,  followed by 1 tablet (250 mg) once daily on Days 2 through 5.  Dispense: 6 each; Refill: 0  Class 3 severe obesity due to excess calories with serious comorbidity and body mass index (BMI) of 45.0 to 49.9 in adult [facility]) Assessment & Plan: She is encouraged to strive for BMI less than 30 to decrease cardiac risk. Advised to aim for at least 150 minutes of exercise per week.      Return in about 4 months (around 05/02/2023).  Patient was given opportunity to ask questions. Patient verbalized understanding of the plan and was able to repeat key elements of the plan. All questions were answered to their satisfaction.    I, Ellender Hose, NP, have reviewed all documentation for this visit. The documentation on 01/07/2023 for the exam, diagnosis, procedures, and orders are all accurate and complete.    IF YOU HAVE BEEN REFERRED TO A SPECIALIST, IT MAY TAKE 1-2 WEEKS TO SCHEDULE/PROCESS THE REFERRAL. IF YOU HAVE NOT HEARD FROM US/SPECIALIST IN TWO WEEKS, PLEASE GIVE [place] A CALL AT [phone].

## 2023-01-02 LAB — BMP8+EGFR
BUN/Creatinine Ratio: 17 (ref 9–23)
BUN: 10 mg/dL (ref 6–20)
CO2: 24 mmol/L (ref 20–29)
Calcium: 9.2 mg/dL (ref 8.7–10.2)
Chloride: 104 mmol/L (ref 96–106)
Creatinine, Ser: 0.6 mg/dL (ref 0.57–1.00)
Glucose: 96 mg/dL (ref 70–99)
Potassium: 4.4 mmol/L (ref 3.5–5.2)
Sodium: 143 mmol/L (ref 134–144)
eGFR: 119 mL/min/{1.73_m2} (ref >59)

## 2023-01-02 LAB — CBC
Hematocrit: 36.4 % (ref 34.0–46.6)
Hemoglobin: 11.6 g/dL (ref 11.1–15.9)
MCH: 26.5 pg — ABNORMAL LOW (ref 26.6–33.0)
MCHC: 31.9 g/dL (ref 31.5–35.7)
MCV: 83 fL (ref 79–97)
Platelets: 386 10*3/uL (ref 150–450)
RBC: 4.38 x10E6/uL (ref 3.77–5.28)
RDW: 13 % (ref 11.7–15.4)
WBC: 13 10*3/uL — ABNORMAL HIGH (ref 3.4–10.8)

## 2023-01-02 LAB — HEMOGLOBIN A1C
Est. average glucose Bld gHb Est-mCnc: 123 mg/dL
Hgb A1c MFr Bld: 5.9 % — ABNORMAL HIGH (ref 4.8–5.6)

## 2023-01-07 DIAGNOSIS — R7303 Prediabetes: Secondary | ICD-10-CM | POA: Insufficient documentation

## 2023-01-07 DIAGNOSIS — J069 Acute upper respiratory infection, unspecified: Secondary | ICD-10-CM | POA: Insufficient documentation

## 2023-01-07 NOTE — Progress Notes (Signed)
A1c 5.9, this is an increase from the last blood draw of 5.6, so back been Prediabetic. Low carb diet advised.  WBC elevated at 13.0, this is understandable because you were sick during visit. Hopefully you are better with antibiotics/rest.  Thank you,  Dennie Bible

## 2023-01-07 NOTE — Assessment & Plan Note (Signed)
 She is encouraged to strive for BMI less than 30 to decrease cardiac risk. Advised to aim for at least 150 minutes of exercise per week.

## 2023-01-07 NOTE — Assessment & Plan Note (Signed)
Gave sample of [employer]

## 2023-01-08 ENCOUNTER — Ambulatory Visit: Payer: Managed Care, Other (non HMO) | Admitting: Family Medicine

## 2023-01-08 ENCOUNTER — Ambulatory Visit: Payer: Managed Care, Other (non HMO) | Admitting: Nurse Practitioner

## 2023-01-22 ENCOUNTER — Encounter: Payer: Self-pay | Admitting: Nurse Practitioner

## 2023-01-29 ENCOUNTER — Encounter: Payer: Self-pay | Admitting: Nurse Practitioner

## 2023-02-11 ENCOUNTER — Other Ambulatory Visit: Payer: Self-pay | Admitting: Nurse Practitioner

## 2023-02-11 DIAGNOSIS — E1169 Type 2 diabetes mellitus with other specified complication: Secondary | ICD-10-CM

## 2023-02-11 MED ORDER — SEMAGLUTIDE(0.25 OR 0.5MG/DOS) 2 MG/3ML ~~LOC~~ SOPN: 0.5000 mg | PEN_INJECTOR | SUBCUTANEOUS | 0 refills | Status: DC

## 2023-02-11 NOTE — Telephone Encounter (Signed)
 Give her a sample of Ozempic  and we will try to send that in I can not make any changes to the quantity limit for Mounjaro , start at 0.25 for 4 weeks then 0.5mg 

## 2023-03-24 ENCOUNTER — Other Ambulatory Visit: Payer: Self-pay | Admitting: Nurse Practitioner

## 2023-03-24 ENCOUNTER — Encounter: Payer: Self-pay | Admitting: Nurse Practitioner

## 2023-03-24 DIAGNOSIS — E1169 Type 2 diabetes mellitus with other specified complication: Secondary | ICD-10-CM

## 2023-03-24 MED ORDER — SEMAGLUTIDE (1 MG/DOSE) 4 MG/3ML ~~LOC~~ SOPN: 1.0000 mg | PEN_INJECTOR | SUBCUTANEOUS | 2 refills | Status: DC

## 2023-04-01 ENCOUNTER — Encounter: Payer: Self-pay | Admitting: Nurse Practitioner

## 2023-04-21 NOTE — Progress Notes (Unsigned)
 Madelaine Bhat, CMA,acting as a Neurosurgeon for Kristin Felts, FNP.,have documented all relevant documentation on the behalf of Kristin Felts, FNP,as directed by  Kristin Felts, FNP while in the presence of Kristin Felts, FNP.  Subjective:  Patient ID: Kristin Love , female    DOB: 01/25/1986 , 38 y.o.   MRN: [id number]  No chief complaint on file.   HPI  Patient presents today for a dm follow up, Patient reports compliance with medication. Patient denies any chest pain, SOB, or headaches. Patient has no concerns today.     Past Medical History:  Diagnosis Date  . Abnormal uterine bleeding (AUB) 07/15/2017   Heavy regular menses, with one episode of irregular/late timing and heavy menses 07/12/17    . Acute sphenoidal sinusitis 12/07/2019  . Diabetes mellitus without complication (HCC)   . Herpes   . PCOS (polycystic ovarian syndrome)   . Vaginal Pap smear, abnormal    had colposcopy repeat pap negative     Family History  Problem Relation Age of Onset  . Hypertension Mother   . Hypertension Father   . Diabetes Father   . Diabetes Maternal Grandmother   . Hypertension Maternal Grandmother   . Diabetes Maternal Grandfather   . Hypertension Maternal Grandfather   . Congestive Heart Failure Maternal Grandfather   . Diabetes Paternal Grandmother   . Hypertension Paternal Grandmother   . Diabetes Paternal Grandfather   . Hypertension Paternal Grandfather   . Stroke Paternal Grandfather      Current Outpatient Medications:  .  acetaminophen-codeine (TYLENOL #3) 300-30 MG tablet, Take 1-2 tablets by mouth every 6 (six) hours as needed for moderate pain., Disp: 10 tablet, Rfl: 0 .  benzonatate (TESSALON PERLES) 100 MG capsule, Take 1 capsule (100 mg total) by mouth 3 (three) times daily as needed for cough., Disp: 30 capsule, Rfl: 1 .  blood glucose meter kit and supplies KIT, Dispense based on patient and insurance preference. Check blood sugar two times a day and as needed (FOR  ICD-9 250.00, 250.01)., Disp: 1 each, Rfl: 0 .  buPROPion (WELLBUTRIN XL) 150 MG 24 hr tablet, Take 1 tablet (150 mg total) by mouth every morning. (Patient not taking: Reported on 01/01/2023), Disp: 30 tablet, Rfl: 2 .  EPINEPHrine 0.3 mg/0.3 mL IJ SOAJ injection, Inject 0.3 mg into the muscle as needed for anaphylaxis., Disp: 1 each, Rfl: 2 .  phentermine 30 MG capsule, Take 30 mg by mouth daily., Disp: , Rfl:  .  Semaglutide, 1 MG/DOSE, 4 MG/3ML SOPN, Inject 1 mg into the skin once a week., Disp: 3 mL, Rfl: 2 .  Semaglutide,0.25 or 0.5MG /DOS, 2 MG/3ML SOPN, Inject 0.5 mg into the skin once a week., Disp: 3 mL, Rfl: 0   Allergies  Allergen Reactions  . Shellfish Allergy Anaphylaxis  . Fish Allergy Swelling    Raw shrimp     Review of Systems   There were no vitals filed for this visit. There is no height or weight on file to calculate BMI.  Wt Readings from Last 3 Encounters:  01/01/23 (!) 328 lb (148.8 kg)  08/20/22 (!) 327 lb 6.4 oz (148.5 kg)  08/19/22 (!) 325 lb (147.4 kg)    The ASCVD Risk score (Arnett DK, et al., 2019) failed to calculate for the following reasons:   The 2019 ASCVD risk score is only valid for ages 59 to 78  Objective:  Physical Exam      Assessment And Plan:  Type 2  diabetes mellitus with obesity (HCC)    No follow-ups on file.  Patient was given opportunity to ask questions. Patient verbalized understanding of the plan and was able to repeat key elements of the plan. All questions were answered to their satisfaction.    Jeanell Sparrow, FNP, have reviewed all documentation for this visit. The documentation on 04/21/23 for the exam, diagnosis, procedures, and orders are all accurate and complete.   IF YOU HAVE BEEN REFERRED TO A SPECIALIST, IT MAY TAKE 1-2 WEEKS TO SCHEDULE/PROCESS THE REFERRAL. IF YOU HAVE NOT HEARD FROM US/SPECIALIST IN TWO WEEKS, PLEASE GIVE [place] A CALL AT [phone].

## 2023-04-22 ENCOUNTER — Ambulatory Visit: Payer: Self-pay | Admitting: Nurse Practitioner

## 2023-04-22 DIAGNOSIS — E1169 Type 2 diabetes mellitus with other specified complication: Secondary | ICD-10-CM

## 2023-04-30 ENCOUNTER — Encounter: Payer: Self-pay | Admitting: Nurse Practitioner

## 2023-05-04 ENCOUNTER — Encounter: Payer: Self-pay | Admitting: Nurse Practitioner

## 2023-05-06 ENCOUNTER — Ambulatory Visit: Payer: Self-pay | Admitting: Nurse Practitioner

## 2023-05-06 VITALS — BP 130/80 | HR 94 | Temp 98.8°F | Ht 68.0 in | Wt 322.2 lb

## 2023-05-06 DIAGNOSIS — E66813 Obesity, class 3: Secondary | ICD-10-CM

## 2023-05-06 DIAGNOSIS — N898 Other specified noninflammatory disorders of vagina: Secondary | ICD-10-CM | POA: Diagnosis not present

## 2023-05-06 DIAGNOSIS — E1169 Type 2 diabetes mellitus with other specified complication: Secondary | ICD-10-CM | POA: Diagnosis not present

## 2023-05-06 DIAGNOSIS — Z6841 Body Mass Index (BMI) 40.0 and over, adult: Secondary | ICD-10-CM

## 2023-05-06 DIAGNOSIS — F32A Depression, unspecified: Secondary | ICD-10-CM

## 2023-05-06 DIAGNOSIS — F419 Anxiety disorder, unspecified: Secondary | ICD-10-CM | POA: Diagnosis not present

## 2023-05-06 DIAGNOSIS — Z2821 Immunization not carried out because of patient refusal: Secondary | ICD-10-CM

## 2023-05-06 DIAGNOSIS — E119 Type 2 diabetes mellitus without complications: Secondary | ICD-10-CM | POA: Insufficient documentation

## 2023-05-06 DIAGNOSIS — Z113 Encounter for screening for infections with a predominantly sexual mode of transmission: Secondary | ICD-10-CM

## 2023-05-06 DIAGNOSIS — Z205 Contact with and (suspected) exposure to viral hepatitis: Secondary | ICD-10-CM

## 2023-05-06 LAB — POCT URINE PREGNANCY: Preg Test, Ur: NEGATIVE

## 2023-05-06 MED ORDER — OZEMPIC (2 MG/DOSE) 8 MG/3ML ~~LOC~~ SOPN: 2.0000 mg | PEN_INJECTOR | SUBCUTANEOUS | 1 refills | Status: DC

## 2023-05-06 MED ORDER — FLUCONAZOLE 100 MG PO TABS: 100.0000 mg | ORAL_TABLET | Freq: Every day | ORAL | 0 refills | Status: AC

## 2023-05-06 NOTE — Progress Notes (Signed)
 [place], CMA,acting as a Neurosurgeon for Susanna Epley, FNP.,have documented all relevant documentation on the behalf of Susanna Epley, FNP,as directed by  Susanna Epley, FNP while in the presence of Susanna Epley, FNP.  Subjective:  Patient ID: Kristin Love , female    DOB: [birth year] , 38 y.o.   MRN: [id number]  Chief Complaint  Patient presents with   Diabetes    HPI  Patient presents today for a dm follow up, Patient reports compliance with medication. Patient denies any chest pain, SOB, or headaches. Patient would like STD testing she reports having a little discharge but has just gotten out of a relationship and found out he had cheated on her. Patient reports her anxiety has been worse since the breakup, she reports they broke up February 22nd. She would also like a pregnancy test to be sure.      Past Medical History:  Diagnosis Date   Abnormal uterine bleeding (AUB) 07/15/2017   Heavy regular menses, with one episode of irregular/late timing and heavy menses 07/12/17     Acute sphenoidal sinusitis 12/07/2019   Diabetes mellitus without complication (HCC)    Herpes    PCOS (polycystic ovarian syndrome)    Vaginal Pap smear, abnormal    had colposcopy repeat pap negative     Family History  Problem Relation Age of Onset   Hypertension Mother    Hypertension Father    Diabetes Father    Diabetes Maternal Grandmother    Hypertension Maternal Grandmother    Diabetes Maternal Grandfather    Hypertension Maternal Grandfather    Congestive Heart Failure Maternal Grandfather    Diabetes Paternal Grandmother    Hypertension Paternal Grandmother    Diabetes Paternal Grandfather    Hypertension Paternal Grandfather    Stroke Paternal Grandfather      Current Outpatient Medications:    acetaminophen -codeine  (TYLENOL  #3) 300-30 MG tablet, Take 1-2 tablets by mouth every 6 (six) hours as needed for moderate pain., Disp: 10 tablet, Rfl: 0   benzonatate  (TESSALON  PERLES) 100  MG capsule, Take 1 capsule (100 mg total) by mouth 3 (three) times daily as needed for cough., Disp: 30 capsule, Rfl: 1   blood glucose meter kit and supplies KIT, Dispense based on patient and insurance preference. Check blood sugar two times a day and as needed (FOR ICD-9 250.00, 250.01)., Disp: 1 each, Rfl: 0   EPINEPHrine  0.3 mg/0.3 mL IJ SOAJ injection, Inject 0.3 mg into the muscle as needed for anaphylaxis., Disp: 1 each, Rfl: 2   fluconazole  (DIFLUCAN ) 100 MG tablet, Take 1 tablet (100 mg total) by mouth daily. Take 1 tablet by mouth now repeat in 5 days, Disp: 2 tablet, Rfl: 0   phentermine 30 MG capsule, Take 30 mg by mouth daily., Disp: , Rfl:    Semaglutide , 2 MG/DOSE, (OZEMPIC , 2 MG/DOSE,) 8 MG/3ML SOPN, Inject 2 mg into the skin once a week., Disp: 9 mL, Rfl: 1   metroNIDAZOLE  (FLAGYL ) 500 MG tablet, Take 1 tablet (500 mg total) by mouth 3 (three) times daily for 10 days., Disp: 30 tablet, Rfl: 0   Allergies  Allergen Reactions   Shellfish Allergy Anaphylaxis   Fish Allergy Swelling    Raw shrimp     Review of Systems  Constitutional: Negative.   Respiratory: Negative.    Cardiovascular: Negative.   Genitourinary:  Positive for vaginal discharge (2 days ago).  Neurological: Negative.   Psychiatric/Behavioral: Negative.       Today's Vitals  05/06/23 1122  BP: 130/80  Pulse: 94  Temp: 98.8 F (37.1 C)  TempSrc: Oral  Weight: (!) 322 lb 3.2 oz (146.1 kg)  Height: 5\' 8"  (1.727 m)  PainSc: 0-No pain   Body mass index is 48.99 kg/m.  Wt Readings from Last 3 Encounters:  05/06/23 (!) 322 lb 3.2 oz (146.1 kg)  01/01/23 (!) 328 lb (148.8 kg)  08/20/22 (!) 327 lb 6.4 oz (148.5 kg)     Objective:  Physical Exam Vitals reviewed.  Constitutional:      General: She is not in acute distress.    Appearance: Normal appearance. She is well-developed. She is obese.  HENT:     Head: Normocephalic and atraumatic.  Eyes:     Pupils: Pupils are equal, round, and reactive  to light.  Cardiovascular:     Rate and Rhythm: Normal rate and regular rhythm.     Pulses: Normal pulses.     Heart sounds: Normal heart sounds. No murmur heard. Pulmonary:     Effort: Pulmonary effort is normal. No respiratory distress.     Breath sounds: Normal breath sounds. No wheezing.  Musculoskeletal:        General: Normal range of motion.  Skin:    General: Skin is warm and dry.     Capillary Refill: Capillary refill takes less than 2 seconds.  Neurological:     General: No focal deficit present.     Mental Status: She is alert and oriented to person, place, and time.     Cranial Nerves: No cranial nerve deficit.     Motor: No weakness.  Psychiatric:        Mood and Affect: Mood normal.         Assessment And Plan:  Type 2 diabetes mellitus with obesity (HCC) Assessment & Plan: A1c improving, continue focusing on healthy diet and regular exercise.   Orders: -     Microalbumin / creatinine urine ratio  Screening for STDs (sexually transmitted diseases) -     NuSwab Vaginitis Plus (VG+) -     RPR -     HIV Antibody (routine testing w rflx)  Vaginal discharge -     POCT urine pregnancy  COVID-19 vaccination declined Assessment & Plan: Declines covid 19 vaccine. Discussed risk of covid 64 and if she changes her mind about the vaccine to call the office. Education has been provided regarding the importance of this vaccine but patient still declined. Advised may receive this vaccine at local pharmacy or Health Dept.or vaccine clinic. Aware to provide a copy of the vaccination record if obtained from local pharmacy or Health Dept.  Encouraged to take multivitamin, vitamin d , vitamin c and zinc to increase immune system. Aware can call office if would like to have vaccine here at office. Verbalized acceptance and understanding.    Class 3 severe obesity due to excess calories with body mass index (BMI) of 45.0 to 49.9 in adult, unspecified whether serious comorbidity  present [facility]) Assessment & Plan: She is encouraged to strive for BMI less than 30 to decrease cardiac risk. Advised to aim for at least 150 minutes of exercise per week. Continue f/u with Weight management. She has lost 6 lbs since her last visit.     Anxiety and depression Assessment & Plan: Depression and anxiety screen score both are elevated however she has recently split from her fiance and found out he was cheating.    Exposure to hepatitis B Assessment & Plan: Exposed  by her ex-fiance, no current symptoms.    Other orders -     Ozempic  (2 MG/DOSE); Inject 2 mg into the skin once a week.  Dispense: 9 mL; Refill: 1 -     Fluconazole ; Take 1 tablet (100 mg total) by mouth daily. Take 1 tablet by mouth now repeat in 5 days  Dispense: 2 tablet; Refill: 0   Return for keep same next. .  Patient was given opportunity to ask questions. Patient verbalized understanding of the plan and was able to repeat key elements of the plan. All questions were answered to their satisfaction.   Inge Mangle, FNP, have reviewed all documentation for this visit. The documentation on 05/06/23 for the exam, diagnosis, procedures, and orders are all accurate and complete.    IF YOU HAVE BEEN REFERRED TO A SPECIALIST, IT MAY TAKE 1-2 WEEKS TO SCHEDULE/PROCESS THE REFERRAL. IF YOU HAVE NOT HEARD FROM US /SPECIALIST IN TWO WEEKS, PLEASE GIVE US  A CALL AT [phone].

## 2023-05-07 LAB — RPR: RPR Ser Ql: NONREACTIVE

## 2023-05-07 LAB — MICROALBUMIN / CREATININE URINE RATIO
Creatinine, Urine: 94.4 mg/dL
Microalb/Creat Ratio: 4 mg/g{creat} (ref 0–29)
Microalbumin, Urine: 3.7 ug/mL

## 2023-05-07 LAB — HIV ANTIBODY (ROUTINE TESTING W REFLEX): HIV Screen 4th Generation wRfx: NONREACTIVE

## 2023-05-09 LAB — NUSWAB VAGINITIS PLUS (VG+)
Atopobium vaginae: HIGH {score} — AB
BVAB 2: HIGH {score} — AB
Candida albicans, NAA: NEGATIVE
Candida glabrata, NAA: NEGATIVE
Chlamydia trachomatis, NAA: NEGATIVE
Megasphaera 1: HIGH {score} — AB
Neisseria gonorrhoeae, NAA: NEGATIVE
Trich vag by NAA: NEGATIVE

## 2023-05-12 ENCOUNTER — Other Ambulatory Visit: Payer: Self-pay | Admitting: Nurse Practitioner

## 2023-05-12 ENCOUNTER — Encounter: Payer: Self-pay | Admitting: Nurse Practitioner

## 2023-05-12 MED ORDER — METRONIDAZOLE 500 MG PO TABS: 500.0000 mg | ORAL_TABLET | Freq: Three times a day (TID) | ORAL | 0 refills | Status: AC

## 2023-05-17 DIAGNOSIS — Z205 Contact with and (suspected) exposure to viral hepatitis: Secondary | ICD-10-CM | POA: Insufficient documentation

## 2023-05-17 NOTE — Assessment & Plan Note (Signed)
 A1c improving, continue focusing on healthy diet and regular exercise.

## 2023-05-17 NOTE — Assessment & Plan Note (Signed)

## 2023-05-17 NOTE — Assessment & Plan Note (Signed)
 Depression and anxiety screen score both are elevated however she has recently split from her fiance and found out he was cheating.

## 2023-05-17 NOTE — Assessment & Plan Note (Signed)
 Exposed by her ex-fiance, no current symptoms.

## 2023-05-17 NOTE — Assessment & Plan Note (Addendum)
 She is encouraged to strive for BMI less than 30 to decrease cardiac risk. Advised to aim for at least 150 minutes of exercise per week. Continue f/u with Weight management. She has lost 6 lbs since her last visit.

## 2023-06-25 ENCOUNTER — Telehealth: Payer: Self-pay

## 2023-06-25 NOTE — Telephone Encounter (Signed)
 Copied from CRM [id number]. Topic: Clinical - Medication Question >> Jun 25, 2023 11:40 AM Kristin Love wrote: Reason for CRM: patient called stated she is on Semaglutide , 2 MG/DOSE, (OZEMPIC , 2 MG/DOSE,) 8 MG/3ML SOPN and is requesting to up her dosage. Please f/u with patient   Patient was called and advised that the ozempic  2mg  is the highest dose.

## 2023-06-29 ENCOUNTER — Encounter: Payer: Self-pay | Admitting: Nurse Practitioner

## 2023-07-08 ENCOUNTER — Encounter: Payer: Self-pay | Admitting: Family Medicine

## 2023-07-08 ENCOUNTER — Ambulatory Visit: Payer: Self-pay | Admitting: Family Medicine

## 2023-07-08 VITALS — BP 110/80 | HR 109 | Temp 98.2°F | Ht 68.0 in | Wt 320.0 lb

## 2023-07-08 DIAGNOSIS — Z6841 Body Mass Index (BMI) 40.0 and over, adult: Secondary | ICD-10-CM

## 2023-07-08 DIAGNOSIS — E66813 Obesity, class 3: Secondary | ICD-10-CM | POA: Diagnosis not present

## 2023-07-08 DIAGNOSIS — Z113 Encounter for screening for infections with a predominantly sexual mode of transmission: Secondary | ICD-10-CM

## 2023-07-08 DIAGNOSIS — N926 Irregular menstruation, unspecified: Secondary | ICD-10-CM | POA: Diagnosis not present

## 2023-07-08 DIAGNOSIS — Z3201 Encounter for pregnancy test, result positive: Secondary | ICD-10-CM | POA: Insufficient documentation

## 2023-07-08 DIAGNOSIS — N898 Other specified noninflammatory disorders of vagina: Secondary | ICD-10-CM | POA: Diagnosis not present

## 2023-07-08 DIAGNOSIS — Z114 Encounter for screening for human immunodeficiency virus [HIV]: Secondary | ICD-10-CM

## 2023-07-08 LAB — POCT URINE PREGNANCY: Preg Test, Ur: POSITIVE — AB

## 2023-07-08 NOTE — Progress Notes (Signed)
 I,Jameka J Llittleton, CMA,acting as a Neurosurgeon for [employer], NP.,have documented all relevant documentation on the behalf of Melodie Spry, NP,as directed by  Melodie Spry, NP while in the presence of Melodie Spry, NP.  Subjective:  Patient ID: Kristin Love , female    DOB: 1985-08-27 , 37 y.o.   MRN: [id number]  Chief Complaint  Patient presents with   positive pregnancy test    HPI  Patient is a 38 year old female who presents today because she has a missed period and she is having some vaginal discharge, if she is pregnant patient will like to get blood work to know how far along in the pregnancy she is and also she wants to get STD testing.       Past Medical History:  Diagnosis Date   Abnormal uterine bleeding (AUB) 07/15/2017   Heavy regular menses, with one episode of irregular/late timing and heavy menses 07/12/17     Acute sphenoidal sinusitis 12/07/2019   Diabetes mellitus without complication (HCC)    Herpes    PCOS (polycystic ovarian syndrome)    Vaginal Pap smear, abnormal    had colposcopy repeat pap negative     Family History  Problem Relation Age of Onset   Hypertension Mother    Hypertension Father    Diabetes Father    Diabetes Maternal Grandmother    Hypertension Maternal Grandmother    Diabetes Maternal Grandfather    Hypertension Maternal Grandfather    Congestive Heart Failure Maternal Grandfather    Diabetes Paternal Grandmother    Hypertension Paternal Grandmother    Diabetes Paternal Grandfather    Hypertension Paternal Grandfather    Stroke Paternal Grandfather      Current Outpatient Medications:    acetaminophen -codeine  (TYLENOL  #3) 300-30 MG tablet, Take 1-2 tablets by mouth every 6 (six) hours as needed for moderate pain., Disp: 10 tablet, Rfl: 0   benzonatate  (TESSALON  PERLES) 100 MG capsule, Take 1 capsule (100 mg total) by mouth 3 (three) times daily as needed for cough., Disp: 30 capsule, Rfl: 1   blood glucose meter kit and supplies  KIT, Dispense based on patient and insurance preference. Check blood sugar two times a day and as needed (FOR ICD-9 250.00, 250.01)., Disp: 1 each, Rfl: 0   EPINEPHrine  0.3 mg/0.3 mL IJ SOAJ injection, Inject 0.3 mg into the muscle as needed for anaphylaxis., Disp: 1 each, Rfl: 2   fluconazole  (DIFLUCAN ) 100 MG tablet, Take 1 tablet (100 mg total) by mouth daily. Take 1 tablet by mouth now repeat in 5 days, Disp: 2 tablet, Rfl: 0   phentermine 30 MG capsule, Take 30 mg by mouth daily., Disp: , Rfl:    Prenatal Vit-Fe Fumarate-FA (PRENATAL PLUS VITAMIN/MINERAL) 27-1 MG TABS, Take 1 tablet by mouth daily., Disp: 30 tablet, Rfl: 5   Allergies  Allergen Reactions   Shellfish Allergy Anaphylaxis   Fish Allergy Swelling    Raw shrimp     Review of Systems  Constitutional: Negative.   HENT: Negative.    Respiratory: Negative.    Cardiovascular: Negative.   Gastrointestinal: Negative.   Genitourinary: Negative.   Psychiatric/Behavioral: Negative.       Today's Vitals   07/08/23 1613  BP: 110/80  Pulse: (!) 109  Temp: 98.2 F (36.8 C)  TempSrc: Oral  Weight: (!) 320 lb (145.2 kg)  Height: 5' 8 (1.727 m)  PainSc: 0-No pain   Body mass index is 48.66 kg/m.  Wt Readings from Last 3  Encounters:  07/08/23 (!) 320 lb (145.2 kg)  05/06/23 (!) 322 lb 3.2 oz (146.1 kg)  01/01/23 (!) 328 lb (148.8 kg)    The ASCVD Risk score (Arnett DK, et al., 2019) failed to calculate for the following reasons:   The 2019 ASCVD risk score is only valid for ages 26 to 28  Objective:  Physical Exam HENT:     Head: Normocephalic.     Nose: Nose normal.   Cardiovascular:     Rate and Rhythm: Tachycardia present.     Pulses: Normal pulses.  Pulmonary:     Effort: Pulmonary effort is normal.  Abdominal:     General: Abdomen is flat.   Neurological:     General: No focal deficit present.     Mental Status: She is alert and oriented to person, place, and time.         Assessment And Plan:   Missed period -     POCT urine pregnancy -     Beta hCG quant (ref lab)  Vaginal discharge -     NuSwab Vaginitis Plus (VG+)  Screening for STDs (sexually transmitted diseases) -     NuSwab Vaginitis Plus (VG+) -     RPR  Encounter for HIV (human immunodeficiency virus) test -     HIV Antibody (routine testing w rflx)  Class 3 severe obesity due to excess calories with serious comorbidity and body mass index (BMI) of 45.0 to 49.9 in adult Assessment & Plan: She is encouraged to strive for BMI less than 30 to decrease cardiac risk. Advised to aim for at least 150 minutes of exercise per week.      Return if symptoms worsen or fail to improve, for as schedule as needed.Keep appt as heeh  Patient was given opportunity to ask questions. Patient verbalized understanding of the plan and was able to repeat key elements of the plan. All questions were answered to their satisfaction.    I, Melodie Spry, NP, have reviewed all documentation for this visit. The documentation on 07/15/2023 for the exam, diagnosis, procedures, and orders are all accurate and complete.    IF YOU HAVE BEEN REFERRED TO A SPECIALIST, IT MAY TAKE 1-2 WEEKS TO SCHEDULE/PROCESS THE REFERRAL. IF YOU HAVE NOT HEARD FROM US /SPECIALIST IN TWO WEEKS, PLEASE GIVE US  A CALL AT [phone].

## 2023-07-09 ENCOUNTER — Encounter: Payer: Self-pay | Admitting: Nurse Practitioner

## 2023-07-09 LAB — BETA HCG QUANT (REF LAB): hCG Quant: 360 m[IU]/mL

## 2023-07-09 LAB — HIV ANTIBODY (ROUTINE TESTING W REFLEX): HIV Screen 4th Generation wRfx: NONREACTIVE

## 2023-07-09 LAB — RPR: RPR Ser Ql: NONREACTIVE

## 2023-07-10 ENCOUNTER — Other Ambulatory Visit: Payer: Self-pay | Admitting: Family Medicine

## 2023-07-10 MED ORDER — PRENATAL PLUS VITAMIN/MINERAL 27-1 MG PO TABS: 1.0000 | ORAL_TABLET | Freq: Every day | ORAL | 5 refills | Status: DC

## 2023-07-11 LAB — NUSWAB VAGINITIS PLUS (VG+)
Candida albicans, NAA: NEGATIVE
Candida glabrata, NAA: NEGATIVE
Chlamydia trachomatis, NAA: NEGATIVE
Neisseria gonorrhoeae, NAA: NEGATIVE
Trich vag by NAA: NEGATIVE

## 2023-07-13 ENCOUNTER — Telehealth: Payer: Self-pay | Admitting: Nurse Practitioner

## 2023-07-13 NOTE — Telephone Encounter (Signed)
 Good afternoon, I seen that you were seen while I was on vacation. I understand you are currently pregnant but plan to not move forward with the pregnancy. Until you have a confirmed negative urine pregnancy test we have to stop your Ozempic  and you should not take the phentermine. Let me know if you have any questions.   Kindest Regards,   Susanna Epley, DNP, FNP-BC

## 2023-07-15 ENCOUNTER — Ambulatory Visit: Payer: Self-pay | Admitting: Family Medicine

## 2023-07-15 NOTE — Assessment & Plan Note (Signed)
 She is encouraged to strive for BMI less than 30 to decrease cardiac risk. Advised to aim for at least 150 minutes of exercise per week.

## 2023-07-15 NOTE — Progress Notes (Signed)
 STD tests negative. HcG levels indicates that you are [redacted] weeks pregnant like you were informed last week. I sent you some prenatal vitamins.  Thanks

## 2023-07-16 ENCOUNTER — Encounter: Payer: Self-pay | Admitting: Nurse Practitioner

## 2023-07-30 ENCOUNTER — Inpatient Hospital Stay (HOSPITAL_COMMUNITY)
Admission: AD | Admit: 2023-07-30 | Discharge: 2023-07-31 | Disposition: A | Discharge status: Home / Self Care | Attending: Obstetrics & Gynecology | Admitting: Obstetrics & Gynecology

## 2023-07-30 ENCOUNTER — Inpatient Hospital Stay (HOSPITAL_COMMUNITY)

## 2023-07-30 ENCOUNTER — Encounter (HOSPITAL_COMMUNITY): Payer: Self-pay | Admitting: Obstetrics & Gynecology

## 2023-07-30 DIAGNOSIS — R109 Unspecified abdominal pain: Secondary | ICD-10-CM | POA: Diagnosis not present

## 2023-07-30 DIAGNOSIS — Z3A01 Less than 8 weeks gestation of pregnancy: Secondary | ICD-10-CM | POA: Insufficient documentation

## 2023-07-30 DIAGNOSIS — O3680X0 Pregnancy with inconclusive fetal viability, not applicable or unspecified: Secondary | ICD-10-CM | POA: Insufficient documentation

## 2023-07-30 DIAGNOSIS — R102 Pelvic and perineal pain: Secondary | ICD-10-CM | POA: Insufficient documentation

## 2023-07-30 DIAGNOSIS — O26891 Other specified pregnancy related conditions, first trimester: Secondary | ICD-10-CM | POA: Insufficient documentation

## 2023-07-30 DIAGNOSIS — O26899 Other specified pregnancy related conditions, unspecified trimester: Secondary | ICD-10-CM

## 2023-07-30 DIAGNOSIS — O209 Hemorrhage in early pregnancy, unspecified: Secondary | ICD-10-CM | POA: Insufficient documentation

## 2023-07-30 LAB — CBC
HCT: 26.6 % — ABNORMAL LOW (ref 36.0–46.0)
Hemoglobin: 8.5 g/dL — ABNORMAL LOW (ref 12.0–15.0)
MCH: 24.8 pg — ABNORMAL LOW (ref 26.0–34.0)
MCHC: 32 g/dL (ref 30.0–36.0)
MCV: 77.6 fL — ABNORMAL LOW (ref 80.0–100.0)
Platelets: 291 10*3/uL (ref 150–400)
RBC: 3.43 MIL/uL — ABNORMAL LOW (ref 3.87–5.11)
RDW: 15.3 % (ref 11.5–15.5)
WBC: 10.6 10*3/uL — ABNORMAL HIGH (ref 4.0–10.5)
nRBC: 0 % (ref 0.0–0.2)

## 2023-07-30 LAB — HCG, QUANTITATIVE, PREGNANCY: hCG, Beta Chain, Quant, S: 15056 m[IU]/mL — ABNORMAL HIGH (ref <5)

## 2023-07-30 NOTE — Progress Notes (Signed)
 Pt connected to visit, but reported that it was not a good time for her. She wished to reschedule. No charge. Will ask schedulers to contact her and reschedule.

## 2023-07-30 NOTE — MAU Note (Signed)
 Kristin Love is a 38 y.o. at [facility] here in [facility]U reporting cramping today. Went to [facility] at 1630 and saw a lot of bright bleeding. States bleeding has slowed down now. Still having some cramping. States she feels weak.   LMP: 06/06/23 Onset of complaint: today Pain score: 8 Vitals:   07/30/23 1954  Pulse: 85  Resp: 18  Temp: 98 F (36.7 C)     FHT: n/a  Lab orders placed from triage: u/a

## 2023-07-30 NOTE — Telephone Encounter (Signed)
-----   Message from Kristin Love, [place] sent at 07/30/2023  1:19 PM EDT ----- Please get patient rescheduled. Today was not a good time for her. Follow up in next 1-3 months at her discretion. Thanks

## 2023-07-31 ENCOUNTER — Encounter: Payer: Self-pay | Admitting: Advanced Practice Midwife

## 2023-07-31 DIAGNOSIS — O209 Hemorrhage in early pregnancy, unspecified: Secondary | ICD-10-CM

## 2023-07-31 DIAGNOSIS — O3680X0 Pregnancy with inconclusive fetal viability, not applicable or unspecified: Secondary | ICD-10-CM

## 2023-07-31 DIAGNOSIS — O26891 Other specified pregnancy related conditions, first trimester: Secondary | ICD-10-CM

## 2023-07-31 DIAGNOSIS — Z3A01 Less than 8 weeks gestation of pregnancy: Secondary | ICD-10-CM

## 2023-07-31 DIAGNOSIS — R109 Unspecified abdominal pain: Secondary | ICD-10-CM

## 2023-07-31 NOTE — MAU Provider Note (Signed)
 Chief Complaint: Vaginal Bleeding and Abdominal Pain   Event Date/Time   First Provider Initiated Contact with Patient 07/31/23 0045        SUBJECTIVE HPI: Kristin Love is a 38 y.o. [id number] at [redacted]w[redacted]d by LMP who presents to maternity admissions reporting pelvic cramping which started today.  Then developed vaginal bleeding.  .  Vaginal Bleeding The patient's primary symptoms include pelvic pain and vaginal bleeding. The current episode started today. The problem occurs constantly. She is pregnant. Associated symptoms include abdominal pain. Pertinent negatives include no chills, diarrhea, dysuria or fever. The vaginal discharge was bloody. The vaginal bleeding is heavier than menses.  Abdominal Pain The current episode started today. The pain is located in the suprapubic region. The pain is at a severity of 8/10. Pertinent negatives include no diarrhea, dysuria or fever.   RN Note: Kristin Love is a 38 y.o. at Unknown here in MAU reporting cramping today. Went to [facility] at 1630 and saw a lot of bright bleeding. States bleeding has slowed down now. Still having some cramping. States she feels weak.   LMP: 06/06/23     Onset of complaint: today Pain score: 8  Past Medical History:  Diagnosis Date   Abnormal uterine bleeding (AUB) 07/15/2017   Heavy regular menses, with one episode of irregular/late timing and heavy menses 07/12/17     Acute sphenoidal sinusitis 12/07/2019   Diabetes mellitus without complication (HCC)    Herpes    PCOS (polycystic ovarian syndrome)    Vaginal Pap smear, abnormal    had colposcopy repeat pap negative   Past Surgical History:  Procedure Laterality Date   CESAREAN SECTION     CESAREAN SECTION N/A 07/24/2015   Procedure: CESAREAN SECTION;  Surgeon: Glenys GORMAN Birk, MD;  Location: [facility] BIRTHING SUITES;  Service: Obstetrics;  Laterality: N/A;   Social History   Socioeconomic History   Marital status: Single    Spouse name: Not on file   Number of children:  Not on file   Years of education: Not on file   Highest education level: Some college, no degree  Occupational History   Not on file  Tobacco Use   Smoking status: Never   Smokeless tobacco: Never  Vaping Use   Vaping status: Never Used  Substance and Sexual Activity   Alcohol use: No   Drug use: No   Sexual activity: Yes    Partners: Male    Birth control/protection: None  Other Topics Concern   Not on file  Social History Narrative   Lives with her children   Caffeine:  1 cup coffee/day, diet soda 1-2/day   Social Drivers of Health   Financial Resource Strain: Low Risk  (05/06/2023)   Overall Financial Resource Strain (CARDIA)    Difficulty of Paying Living Expenses: Not very hard  Food Insecurity: No Food Insecurity (05/06/2023)   Hunger Vital Sign    Worried About Running Out of Food in the Last Year: Never true    Ran Out of Food in the Last Year: Never true  Transportation Needs: No Transportation Needs (05/06/2023)   PRAPARE - Administrator, Civil Service (Medical): No    Lack of Transportation (Non-Medical): No  Physical Activity: Sufficiently Active (05/06/2023)   Exercise Vital Sign    Days of Exercise per Week: 3 days    Minutes of Exercise per Session: 60 min  Stress: Stress Concern Present (05/06/2023)   [employer] of Occupational Health - Occupational  Stress Questionnaire    Feeling of Stress : Very much  Social Connections: Moderately Isolated (05/06/2023)   Social Connection and Isolation Panel    Frequency of Communication with Friends and Family: Twice a week    Frequency of Social Gatherings with Friends and Family: Never    Attends Religious Services: More than 4 times per year    Active Member of [employer] or Organizations: Yes    Attends Banker Meetings: 1 to 4 times per year    Marital Status: Never married  Intimate Partner Violence: Not on file   No current facility-administered medications on file prior to encounter.    Current Outpatient Medications on File Prior to Encounter  Medication Sig Dispense Refill   acetaminophen -codeine  (TYLENOL  #3) 300-30 MG tablet Take 1-2 tablets by mouth every 6 (six) hours as needed for moderate pain. 10 tablet 0   benzonatate  (TESSALON  PERLES) 100 MG capsule Take 1 capsule (100 mg total) by mouth 3 (three) times daily as needed for cough. 30 capsule 1   blood glucose meter kit and supplies KIT Dispense based on patient and insurance preference. Check blood sugar two times a day and as needed (FOR ICD-9 250.00, 250.01). 1 each 0   EPINEPHrine  0.3 mg/0.3 mL IJ SOAJ injection Inject 0.3 mg into the muscle as needed for anaphylaxis. 1 each 2   fluconazole  (DIFLUCAN ) 100 MG tablet Take 1 tablet (100 mg total) by mouth daily. Take 1 tablet by mouth now repeat in 5 days 2 tablet 0   phentermine 30 MG capsule Take 30 mg by mouth daily.     Prenatal Vit-Fe Fumarate-FA (PRENATAL PLUS VITAMIN/MINERAL) 27-1 MG TABS Take 1 tablet by mouth daily. 30 tablet 5   Allergies  Allergen Reactions   Shellfish Allergy Anaphylaxis   Fish Allergy Swelling    Raw shrimp    I have reviewed patient's Past Medical Hx, Surgical Hx, Family Hx, Social Hx, medications and allergies.   ROS:  Review of Systems  Constitutional:  Negative for chills and fever.  Gastrointestinal:  Positive for abdominal pain. Negative for diarrhea.  Genitourinary:  Positive for pelvic pain and vaginal bleeding. Negative for dysuria.   Review of Systems  Other systems negative   Physical Exam  Physical Exam Patient Vitals for the past 24 hrs:  Temp Pulse Resp Height Weight  07/30/23 1954 98 F (36.7 C) 85 18 5' 8 (1.727 m) (!) 145.2 kg   Constitutional: Well-developed, well-nourished female in no acute distress.  Cardiovascular: normal rate Respiratory: normal effort GI: Abd soft, non-tender.  MS: Extremities nontender, no edema, normal ROM Neurologic: Alert and oriented x 4.   PELVIC EXAM: de14ferred in  lieu of ultrasound  LAB RESULTS Results for orders placed or performed during the hospital encounter of 07/30/23 (from the past 24 hours)  hCG, quantitative, pregnancy     Status: Abnormal   Collection Time: 07/30/23  8:39 PM  Result Value Ref Range   hCG, Beta Chain, Quant, S 15,056 (H) <5 mIU/mL  CBC     Status: Abnormal   Collection Time: 07/30/23  8:39 PM  Result Value Ref Range   WBC 10.6 (H) 4.0 - 10.5 K/uL   RBC 3.43 (L) 3.87 - 5.11 MIL/uL   Hemoglobin 8.5 (L) 12.0 - 15.0 g/dL   HCT 73.3 (L) 63.9 - 53.9 %   MCV 77.6 (L) 80.0 - 100.0 fL   MCH 24.8 (L) 26.0 - 34.0 pg   MCHC 32.0 30.0 - 36.0  g/dL   RDW 84.6 88.4 - 84.4 %   Platelets 291 150 - 400 K/uL   nRBC 0.0 0.0 - 0.2 %   IMAGING US  OB LESS THAN 14 WEEKS WITH OB TRANSVAGINAL Result Date: 07/30/2023 CLINICAL DATA:  832121 Bleeding in early pregnancy 832121 EXAM: OBSTETRIC <14 WK US  AND TRANSVAGINAL OB US  TECHNIQUE: Both transabdominal and transvaginal ultrasound examinations were performed for complete evaluation of the gestation as well as the maternal uterus, adnexal regions, and pelvic cul-de-sac. Transvaginal technique was performed to assess early pregnancy. COMPARISON:  March 23, 2020 FINDINGS: Technically difficult exam due to patient's body habitus and overlying bowel gas. Intrauterine gestational sac: Not present, at this time. Yolk sac:  Not present, at this time. Fetal Pole:  Not present, at this time. Cardiac Activity: Not present, at this time. Subchorionic hemorrhage:  None visualized. Maternal uterus/adnexae: Enlarged uterus. Neither ovary was well visualized. Trace free fluid in the pelvis. IMPRESSION: No intrauterine pregnancy. In the setting of a positive beta HCG, differential considerations include an early pregnancy, complete miscarriage, or an unseen ectopic pregnancy. Obstetric consultation with serial beta hCG and sonographic follow-up should be obtained, as clinically warranted. Electronically Signed   By:  Rogelia Myers M.D.   On: 07/30/2023 21:53   MAU Management/MDM: I have reviewed the triage vital signs and the nursing notes.   Pertinent labs & imaging results that were available during my care of the patient were reviewed by me and considered in my medical decision making (see chart for details).      I have reviewed her medical records including past results, notes and treatments. Medical, Surgical, and family history were reviewed.  Medications and recent lab tests were reviewed  Ordered usual first trimester r/o ectopic labs.   Will check baseline Ultrasound to rule out ectopic.  Consult Dr Fredirick with presentation, exam findings, and results.  She recommends repeating HCG in 48 hours.  Discussed with patient these recommendations.  She prefers to followup with her primary doctor, Dr Georgina.  I offered for her to come back here for lab to be done stat. She states she has already waited too long tonight and does not want to follow up here.   This bleeding/pain can represent a normal pregnancy with bleeding, spontaneous abortion or even an ectopic which can be life-threatening.  The process as listed above helps to determine which of these is present.   ASSESSMENT Pregnancy at [redacted]w[redacted]d by LMP Vaginal bleeding in early pregnancy Pelvic cramping  Pregnancy of unknown location   PLAN Discharge home Plan to repeat HCG level in 48 hours in setting of patient's choosing.   Will repeat  Ultrasound in about 7-10 days if HCG levels double appropriately  Ectopic precautions  Pt stable at time of discharge. Encouraged to return here if she develops worsening of symptoms, increase in pain, fever, or other concerning symptoms.    Earnie Pouch CNM, MSN Certified Nurse-Midwife 07/31/2023  12:45 AM

## 2023-08-24 ENCOUNTER — Ambulatory Visit (INDEPENDENT_AMBULATORY_CARE_PROVIDER_SITE_OTHER): Payer: Managed Care, Other (non HMO) | Admitting: Nurse Practitioner

## 2023-08-24 ENCOUNTER — Encounter: Payer: Self-pay | Admitting: Nurse Practitioner

## 2023-08-24 VITALS — BP 120/84 | HR 84 | Temp 98.9°F | Ht 68.0 in | Wt 322.0 lb

## 2023-08-24 DIAGNOSIS — E1169 Type 2 diabetes mellitus with other specified complication: Secondary | ICD-10-CM | POA: Diagnosis not present

## 2023-08-24 DIAGNOSIS — Z3A00 Weeks of gestation of pregnancy not specified: Secondary | ICD-10-CM

## 2023-08-24 DIAGNOSIS — Z79899 Other long term (current) drug therapy: Secondary | ICD-10-CM

## 2023-08-24 DIAGNOSIS — O039 Complete or unspecified spontaneous abortion without complication: Secondary | ICD-10-CM

## 2023-08-24 DIAGNOSIS — E669 Obesity, unspecified: Secondary | ICD-10-CM | POA: Diagnosis not present

## 2023-08-24 DIAGNOSIS — Z0000 Encounter for general adult medical examination without abnormal findings: Secondary | ICD-10-CM | POA: Diagnosis not present

## 2023-08-24 DIAGNOSIS — F32A Depression, unspecified: Secondary | ICD-10-CM | POA: Diagnosis not present

## 2023-08-24 DIAGNOSIS — E66813 Obesity, class 3: Secondary | ICD-10-CM

## 2023-08-24 DIAGNOSIS — Z113 Encounter for screening for infections with a predominantly sexual mode of transmission: Secondary | ICD-10-CM

## 2023-08-24 DIAGNOSIS — Z6841 Body Mass Index (BMI) 40.0 and over, adult: Secondary | ICD-10-CM

## 2023-08-24 DIAGNOSIS — F419 Anxiety disorder, unspecified: Secondary | ICD-10-CM

## 2023-08-24 NOTE — Patient Instructions (Signed)

## 2023-08-24 NOTE — Progress Notes (Signed)
 I,Jameka J Llittleton, CMA,acting as a Neurosurgeon for [employer], FNP.,have documented all relevant documentation on the behalf of [name], FNP,as directed by  [name], FNP while in the presence of [name], FNP.  Subjective:    Patient ID: Kristin Love , female    DOB: 03-18-1985 , 38 y.o.   MRN: [id number]  Chief Complaint  Patient presents with   Annual Exam    Patient presents today for a physical. Patient reports she has some questions she reports she had a miscarriage 07/28/23 and has not stopped bleeding.     HPI  Discussed the use of AI scribe software for clinical note transcription with the patient, who gave verbal consent to proceed.  History of Present Illness Kristin Love is a 38 year old female who presents for an annual physical exam.  She experienced a miscarriage with bleeding starting on July 1st, confirmed by ultrasound on July 3rd. She reports that she is still bleeding, and that the bleeding is now light pink. Her beta HCG levels were initially 15,000 and decreased to 360 by June 11th. She was not using contraception at the time of conception due to concerns about weight gain from birth control.  She has a history of weight management issues and has been unable to get below 300 pounds despite previous bariatric surgery. She stopped her weight management injections upon discovering her pregnancy and has gained two pounds since her last visit in June. She continues to drink a protein shake in the morning and skips breakfast.  She states she does not currently have diabetes, but acknowledges her A1c has been over 6.5 in the past. She has not been checking her blood sugar regularly and has not visited the eye doctor in the past year despite her history.  She experiences postnasal drip and has had it for years. She craves ice, which she associates with low iron levels, and has not been taking her bariatric vitamins for a month and a half but has recently  ordered them.  She is experiencing stress due to housing instability and financial difficulties. She is looking for a new place to live as her lease is not being renewed and has been denied housing due to credit issues. She is seeking a Product/process development scientist.  She has a son with ADHD and ODD who does not live with her, and she is considering applying for additional resources for him.  She reports significant foot swelling while at the hospital, requiring her to cut off her sandals. 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  She has a history of an abnormal Pap smear and is due for a follow-up. She reports facial skin issues, including dark spots and dry skin, and uses Cetaphil products for cleansing and moisturizing. She plucks facial hair, which may contribute to skin irritation.   HPI   Past Medical History:  Diagnosis Date   Abnormal uterine bleeding (AUB) 07/15/2017   Heavy regular menses, with one episode of irregular/late timing and heavy menses 07/12/17     Acute sphenoidal sinusitis 12/07/2019   Diabetes mellitus without complication (HCC)    Herpes    PCOS (polycystic ovarian syndrome)    Vaginal Pap smear, abnormal    had colposcopy repeat pap negative     Family History  Problem Relation Age of Onset   Hypertension Mother    Hypertension Father    Diabetes Father    Diabetes Maternal Grandmother  Hypertension Maternal Grandmother    Diabetes Maternal Grandfather    Hypertension Maternal Grandfather    Congestive Heart Failure Maternal Grandfather    Diabetes Paternal Grandmother    Hypertension Paternal Grandmother    Diabetes Paternal Grandfather    Hypertension Paternal Grandfather    Stroke Paternal Grandfather      Current Outpatient Medications:    blood glucose meter kit and supplies KIT, Dispense based on patient and insurance preference. Check blood sugar two times a day and as needed (FOR ICD-9 250.00, 250.01)., Disp: 1 each, Rfl: 0    EPINEPHrine  0.3 mg/0.3 mL IJ SOAJ injection, Inject 0.3 mg into the muscle as needed for anaphylaxis., Disp: 1 each, Rfl: 2   Prenatal Vit-Fe Fumarate-FA (PRENATAL PLUS VITAMIN/MINERAL) 27-1 MG TABS, Take 1 tablet by mouth daily., Disp: 30 tablet, Rfl: 5   benzonatate  (TESSALON  PERLES) 100 MG capsule, Take 1 capsule (100 mg total) by mouth 3 (three) times daily as needed for cough. (Patient not taking: Reported on 08/24/2023), Disp: 30 capsule, Rfl: 1   Ferric Maltol  (ACCRUFER ) 30 MG CAPS, Take one tablet by mouth daily., Disp: 90 capsule, Rfl: 1   fluconazole  (DIFLUCAN ) 100 MG tablet, Take 1 tablet (100 mg total) by mouth daily. Take 1 tablet by mouth now repeat in 5 days (Patient not taking: Reported on 08/24/2023), Disp: 2 tablet, Rfl: 0   Allergies  Allergen Reactions   Shellfish Allergy Anaphylaxis   Fish Allergy Swelling    Raw shrimp      The patient states she uses none for birth control. Patient's last menstrual period was 06/06/2023 (approximate).. Negative for Dysmenorrhea and Positive for Menorrhagia. Negative for: breast discharge, breast lump(s), breast pain and breast self exam. Associated symptoms include abnormal vaginal bleeding. Pertinent negatives include abnormal bleeding (hematology), anxiety, decreased libido, depression, difficulty falling sleep, dyspareunia, history of infertility, nocturia, sexual dysfunction, sleep disturbances, urinary incontinence, urinary urgency, vaginal discharge and vaginal itching. Diet regular.The patient states her exercise level is    The patient's tobacco use is:  Social History   Tobacco Use  Smoking Status Never  Smokeless Tobacco Never   She has been exposed to passive smoke. The patient's alcohol use is:  Social History   Substance and Sexual Activity  Alcohol Use No    Review of Systems  Constitutional: Negative.   HENT: Negative.    Eyes: Negative.   Respiratory: Negative.    Cardiovascular: Negative.   Gastrointestinal:  Negative.   Endocrine: Negative.   Genitourinary: Negative.   Musculoskeletal: Negative.   Skin: Negative.   Allergic/Immunologic: Negative.   Neurological: Negative.   Hematological: Negative.   Psychiatric/Behavioral: Negative.       Today's Vitals   08/24/23 1112  BP: 120/84  Pulse: 84  Temp: 98.9 F (37.2 C)  TempSrc: Oral  Weight: (!) 322 lb (146.1 kg)  Height: 5' 8 (1.727 m)  PainSc: 0-No pain   Body mass index is 48.96 kg/m.  Wt Readings from Last 3 Encounters:  08/24/23 (!) 322 lb (146.1 kg)  07/30/23 (!) 320 lb (145.2 kg)  07/08/23 (!) 320 lb (145.2 kg)     Objective:  Physical Exam Vitals and nursing note reviewed.  Constitutional:      General: She is not in acute distress.    Appearance: Normal appearance. She is well-developed. She is obese.  HENT:     Head: Normocephalic and atraumatic.     Right Ear: Hearing, tympanic membrane, ear canal and external ear normal. There  is no impacted cerumen.     Left Ear: Hearing, tympanic membrane, ear canal and external ear normal. There is no impacted cerumen.     Nose: Nose normal.     Mouth/Throat:     Mouth: Mucous membranes are moist.  Eyes:     General: Lids are normal.     Extraocular Movements: Extraocular movements intact.     Conjunctiva/sclera: Conjunctivae normal.     Pupils: Pupils are equal, round, and reactive to light.     Funduscopic exam:    Right eye: No papilledema.        Left eye: No papilledema.  Neck:     Thyroid : No thyroid  mass.     Vascular: No carotid bruit.  Cardiovascular:     Rate and Rhythm: Normal rate and regular rhythm.     Pulses: Normal pulses.     Heart sounds: Normal heart sounds. No murmur heard. Pulmonary:     Effort: Pulmonary effort is normal. No respiratory distress.     Breath sounds: Normal breath sounds. No wheezing.  Chest:     Chest wall: No mass.  Breasts:    Tanner Score is 5.     Right: Normal. No mass or tenderness.     Left: Normal. No mass or  tenderness.  Abdominal:     General: Abdomen is flat. Bowel sounds are normal. There is no distension.     Palpations: Abdomen is soft.     Tenderness: There is no abdominal tenderness.  Genitourinary:    Comments: Deferred - Followed by GYN Musculoskeletal:        General: No swelling. Normal range of motion.     Cervical back: Full passive range of motion without pain, normal range of motion and neck supple.     Right lower leg: No edema.     Left lower leg: No edema.  Lymphadenopathy:     Upper Body:     Right upper body: No supraclavicular, axillary or pectoral adenopathy.     Left upper body: No supraclavicular, axillary or pectoral adenopathy.  Skin:    General: Skin is warm and dry.     Capillary Refill: Capillary refill takes less than 2 seconds.  Neurological:     General: No focal deficit present.     Mental Status: She is alert and oriented to person, place, and time.     Cranial Nerves: No cranial nerve deficit.     Sensory: No sensory deficit.     Motor: No weakness.  Psychiatric:        Mood and Affect: Mood normal.        Behavior: Behavior normal.        Thought Content: Thought content normal.        Judgment: Judgment normal.      Diabetic foot exam was performed with the following findings:   No deformities, ulcerations, or other skin breakdown Normal sensation of 10g monofilament Intact posterior tibialis and dorsalis pedis pulses Heel area has decreased sensation and thickened skin.       Assessment And Plan:     Encounter for annual health examination Assessment & Plan: Requires routine health maintenance, vaccinations, and screenings. - Refer to dermatologist for facial skin issues. - Refer to gynecologist for follow-up on abnormal Pap smear. - Advise annual eye exam due to diabetes.  Needs follow-up for health concerns and medication management. - Schedule follow-up appointment in four months unless issues arise. - Advise rescheduling  bariatric  clinic appointment. - Ensure follow-up with gynecologist for abnormal Pap smear.   Type 2 diabetes mellitus with obesity (HCC) Assessment & Plan: Previously managed with GLP-1 receptor agonist. Not on medication due to pregnancy. Discussed restarting at lower dose. - Order GLP-1 receptor agonist (Semaglutide ) at 0.5 mg for one month once her beta hcg is less than 5 and for 1-2 months after being pregnant, then reassess. - Advise resumption of regular blood glucose monitoring.  Orders: -     EKG 12-Lead -     CMP14+EGFR -     Hemoglobin A1c -     Lipid panel  Anxiety and depression  Class 3 severe obesity due to excess calories with serious comorbidity and body mass index (BMI) of 45.0 to 49.9 in adult Assessment & Plan: Difficulty managing weight post-bariatric surgery. Stress eating and lack of exercise noted. - Encourage resumption of exercise routine. - Refer to bariatric clinic for follow-up.   Miscarriage Assessment & Plan: Miscarriage confirmed by ultrasound at 7 weeks. Bleeding light pink, may continue for weeks. - Order beta HCG to confirm decreased levels. - Check hemoglobin to ensure it has not dropped significantly.  Orders: -     Beta hCG quant (ref lab) -     Iron, TIBC and Ferritin Panel  Other long term (current) drug therapy -     CBC  Screening for STD (sexually transmitted disease) -     NuSwab Vaginitis Plus (VG+)     No follow-ups on file. Patient was given opportunity to ask questions. Patient verbalized understanding of the plan and was able to repeat key elements of the plan. All questions were answered to their satisfaction.   [name], FNP  I, [name], FNP, have reviewed all documentation for this visit. The documentation on 08/24/23 for the exam, diagnosis, procedures, and orders are all accurate and complete.

## 2023-08-25 LAB — CMP14+EGFR
ALT: 16 IU/L (ref 0–32)
AST: 17 IU/L (ref 0–40)
Albumin: 3.8 g/dL — ABNORMAL LOW (ref 3.9–4.9)
Alkaline Phosphatase: 70 IU/L (ref 44–121)
BUN/Creatinine Ratio: 20 (ref 9–23)
BUN: 10 mg/dL (ref 6–20)
Bilirubin Total: 0.5 mg/dL (ref 0.0–1.2)
CO2: 21 mmol/L (ref 20–29)
Calcium: 8.8 mg/dL (ref 8.7–10.2)
Chloride: 105 mmol/L (ref 96–106)
Creatinine, Ser: 0.49 mg/dL — ABNORMAL LOW (ref 0.57–1.00)
Globulin, Total: 3.2 g/dL (ref 1.5–4.5)
Glucose: 76 mg/dL (ref 70–99)
Potassium: 4.3 mmol/L (ref 3.5–5.2)
Sodium: 140 mmol/L (ref 134–144)
Total Protein: 7 g/dL (ref 6.0–8.5)
eGFR: 124 mL/min/1.73 (ref >59)

## 2023-08-25 LAB — IRON,TIBC AND FERRITIN PANEL
Ferritin: 10 ng/mL — ABNORMAL LOW (ref 15–150)
Iron Saturation: 4 % — CL (ref 15–55)
Iron: 14 ug/dL — ABNORMAL LOW (ref 27–159)
Total Iron Binding Capacity: 391 ug/dL (ref 250–450)
UIBC: 377 ug/dL (ref 131–425)

## 2023-08-25 LAB — LIPID PANEL
Chol/HDL Ratio: 2.6 ratio (ref 0.0–4.4)
Cholesterol, Total: 183 mg/dL (ref 100–199)
HDL: 71 mg/dL (ref >39)
LDL Chol Calc (NIH): 104 mg/dL — ABNORMAL HIGH (ref 0–99)
Triglycerides: 36 mg/dL (ref 0–149)
VLDL Cholesterol Cal: 8 mg/dL (ref 5–40)

## 2023-08-25 LAB — CBC
Hematocrit: 28.4 % — ABNORMAL LOW (ref 34.0–46.6)
Hemoglobin: 8.5 g/dL — ABNORMAL LOW (ref 11.1–15.9)
MCH: 23.5 pg — ABNORMAL LOW (ref 26.6–33.0)
MCHC: 29.9 g/dL — ABNORMAL LOW (ref 31.5–35.7)
MCV: 79 fL (ref 79–97)
Platelets: 368 x10E3/uL (ref 150–450)
RBC: 3.62 x10E6/uL — ABNORMAL LOW (ref 3.77–5.28)
RDW: 14.5 % (ref 11.7–15.4)
WBC: 6.7 x10E3/uL (ref 3.4–10.8)

## 2023-08-25 LAB — HEMOGLOBIN A1C
Est. average glucose Bld gHb Est-mCnc: 105 mg/dL
Hgb A1c MFr Bld: 5.3 % (ref 4.8–5.6)

## 2023-08-25 LAB — BETA HCG QUANT (REF LAB): hCG Quant: 218 m[IU]/mL

## 2023-09-01 ENCOUNTER — Encounter: Payer: Self-pay | Admitting: Nurse Practitioner

## 2023-09-07 ENCOUNTER — Ambulatory Visit: Payer: Self-pay

## 2023-09-07 ENCOUNTER — Other Ambulatory Visit: Payer: Self-pay

## 2023-09-07 MED ORDER — ACCRUFER 30 MG PO CAPS: ORAL_CAPSULE | ORAL | 1 refills | Status: AC

## 2023-09-07 NOTE — Telephone Encounter (Signed)
 Miscarriage July 1, states she is fatigued d/t low iron. States sent message to PCP last week, no response. Patient states bleeding is resolved. Would like call back from providers office.  FYI Only or Action Required?: Action required by provider: clinical question for provider and update on patient condition.  Patient was last seen in primary care on 08/24/2023 by Georgina Speaks, FNP.  Called Nurse Triage reporting No chief complaint on file..  Symptoms began several weeks ago.  Interventions attempted: Rest, hydration, or home remedies.  Symptoms are: unchanged.  Triage Disposition: No disposition on file.  Patient/caregiver understands and will follow disposition?:   Copied from CRM [id number]. Topic: Clinical - Red Word Triage >> Sep 07, 2023 11:51 AM Myrick T wrote: Red Word that prompted transfer to Nurse Triage: patient called stated her iron is very low. She has been experiencing extreme fatigue, eating 7 cups of ice per day and a headache

## 2023-09-10 ENCOUNTER — Other Ambulatory Visit: Payer: Self-pay | Admitting: Nurse Practitioner

## 2023-09-10 ENCOUNTER — Ambulatory Visit: Payer: Self-pay | Admitting: Nurse Practitioner

## 2023-09-10 DIAGNOSIS — O039 Complete or unspecified spontaneous abortion without complication: Secondary | ICD-10-CM

## 2023-09-10 DIAGNOSIS — E611 Iron deficiency: Secondary | ICD-10-CM

## 2023-09-10 NOTE — Assessment & Plan Note (Signed)
 Difficulty managing weight post-bariatric surgery. Stress eating and lack of exercise noted. - Encourage resumption of exercise routine. - Refer to bariatric clinic for follow-up.

## 2023-09-10 NOTE — Assessment & Plan Note (Signed)
 Requires routine health maintenance, vaccinations, and screenings. - Refer to dermatologist for facial skin issues. - Refer to gynecologist for follow-up on abnormal Pap smear. - Advise annual eye exam due to diabetes.  Needs follow-up for health concerns and medication management. - Schedule follow-up appointment in four months unless issues arise. - Advise rescheduling bariatric clinic appointment. - Ensure follow-up with gynecologist for abnormal Pap smear.

## 2023-09-10 NOTE — Assessment & Plan Note (Signed)
 Previously managed with GLP-1 receptor agonist. Not on medication due to pregnancy. Discussed restarting at lower dose. - Order GLP-1 receptor agonist (Semaglutide ) at 0.5 mg for one month once her beta hcg is less than 5 and for 1-2 months after being pregnant, then reassess. - Advise resumption of regular blood glucose monitoring.

## 2023-09-10 NOTE — Assessment & Plan Note (Signed)
 Miscarriage confirmed by ultrasound at 7 weeks. Bleeding light pink, may continue for weeks. - Order beta HCG to confirm decreased levels. - Check hemoglobin to ensure it has not dropped significantly.

## 2023-09-11 ENCOUNTER — Other Ambulatory Visit: Payer: Self-pay

## 2023-09-13 LAB — NUSWAB VAGINITIS PLUS (VG+)
Atopobium vaginae: HIGH {score} — AB
Candida albicans, NAA: NEGATIVE
Candida glabrata, NAA: NEGATIVE
Chlamydia trachomatis, NAA: NEGATIVE
Megasphaera 1: HIGH {score} — AB
Neisseria gonorrhoeae, NAA: NEGATIVE
Trich vag by NAA: NEGATIVE

## 2023-10-01 ENCOUNTER — Encounter: Payer: Self-pay | Admitting: Hematology and Oncology

## 2023-10-01 ENCOUNTER — Inpatient Hospital Stay: Attending: Hematology and Oncology | Admitting: Hematology and Oncology

## 2023-10-01 ENCOUNTER — Inpatient Hospital Stay

## 2023-10-01 VITALS — BP 115/63 | HR 75 | Temp 98.2°F | Resp 17 | Wt 320.0 lb

## 2023-10-01 DIAGNOSIS — D509 Iron deficiency anemia, unspecified: Secondary | ICD-10-CM

## 2023-10-01 DIAGNOSIS — Z823 Family history of stroke: Secondary | ICD-10-CM | POA: Diagnosis not present

## 2023-10-01 DIAGNOSIS — R42 Dizziness and giddiness: Secondary | ICD-10-CM | POA: Insufficient documentation

## 2023-10-01 DIAGNOSIS — K909 Intestinal malabsorption, unspecified: Secondary | ICD-10-CM | POA: Insufficient documentation

## 2023-10-01 DIAGNOSIS — Z8249 Family history of ischemic heart disease and other diseases of the circulatory system: Secondary | ICD-10-CM | POA: Insufficient documentation

## 2023-10-01 DIAGNOSIS — E1169 Type 2 diabetes mellitus with other specified complication: Secondary | ICD-10-CM | POA: Diagnosis not present

## 2023-10-01 DIAGNOSIS — Z79899 Other long term (current) drug therapy: Secondary | ICD-10-CM | POA: Diagnosis not present

## 2023-10-01 DIAGNOSIS — F5089 Other specified eating disorder: Secondary | ICD-10-CM | POA: Insufficient documentation

## 2023-10-01 DIAGNOSIS — D508 Other iron deficiency anemias: Secondary | ICD-10-CM | POA: Diagnosis present

## 2023-10-01 DIAGNOSIS — Z8759 Personal history of other complications of pregnancy, childbirth and the puerperium: Secondary | ICD-10-CM | POA: Insufficient documentation

## 2023-10-01 DIAGNOSIS — R5383 Other fatigue: Secondary | ICD-10-CM | POA: Diagnosis not present

## 2023-10-01 DIAGNOSIS — L603 Nail dystrophy: Secondary | ICD-10-CM | POA: Diagnosis not present

## 2023-10-01 DIAGNOSIS — Z9884 Bariatric surgery status: Secondary | ICD-10-CM | POA: Insufficient documentation

## 2023-10-01 DIAGNOSIS — Z833 Family history of diabetes mellitus: Secondary | ICD-10-CM | POA: Insufficient documentation

## 2023-10-01 NOTE — Progress Notes (Signed)
 [facility] CONSULT NOTE  Patient Care Team: Georgina Speaks, FNP as PCP - General (General Practice)  CHIEF COMPLAINTS/PURPOSE OF CONSULTATION:  Anemia.  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 Hemoglobin at 8.5. Symptoms include pica, lightheadedness, and fatigue. Oral iron ineffective. - Order iron infusion with Venofer, three infusions weekly.  We have discussed about risk of allergic/infusion reactions including potentially life-threatening anaphylaxis with intravenous iron however these serious allergic reactions are exceedingly rare and overestimated.  In contrast to serious allergic reactions, IV iron may be associated with nonallergic infusion reactions including self-limited urticaria, palpitations, dizziness, neck and back spasm which again occur in less than 1% of the individuals and do not progress to more serious reactions.   PLAN - Submit plan to [employer] for authorization of iron infusion. - Schedule follow-up in six months to reassess iron levels and symptoms.    HISTORY OF PRESENTING ILLNESS:  Kristin Love 38 y.o. female is here because of IDA  Discussed the use of AI scribe software for clinical note transcription with the patient, who gave verbal consent to proceed.  History of Present Illness Kristin Love is a 38 year old female who presents with anemia.  She has significant anemia with a hemoglobin level of 8.5 g/dL, experiencing symptoms such as pica, specifically craving ice, consuming up to nine cups a day. These symptoms began after a miscarriage in July 2024, when she was approximately seven to eight weeks pregnant.  She underwent gastric sleeve surgery in 2023 and has not been taking her vitamins as  prescribed, which may contribute to her anemia. Her hemoglobin levels were reportedly normal in December 2024, prior to the miscarriage.  She experiences lightheadedness when bending over and feels more tired than usual, attributing some fatigue to poor sleep. She consumes a protein shake with caffeine every morning but struggles with sleep, often getting only four hours per night due to stress and recent life changes, including a move.  Her menstrual cycles are regular but have become lighter since her weight loss surgery. She has lost a significant amount of weight, previously weighing 418 pounds. She also reports some hair loss and slightly brittle nails, which may be related to her iron deficiency.  She is a single mother of two children, a 20 year old son with ADHD and ODD, and an 76-year-old daughter. She describes her son as 'very defiant' and notes that managing his behavior is challenging, contributing to her stress and fatigue.   All other systems were reviewed with the patient and are negative.  MEDICAL HISTORY:  Past Medical History:  Diagnosis Date   Abnormal uterine bleeding (AUB) 07/15/2017   Heavy regular menses, with one episode of irregular/late timing and heavy menses 07/12/17     Acute sphenoidal sinusitis 12/07/2019   Diabetes mellitus without complication (HCC)    Herpes    PCOS (polycystic ovarian syndrome)    Vaginal Pap smear, abnormal    had colposcopy repeat pap negative    SURGICAL HISTORY: Past Surgical History:  Procedure Laterality Date   CESAREAN SECTION     CESAREAN SECTION N/A 07/24/2015   Procedure: CESAREAN SECTION;  Surgeon: Glenys GORMAN Birk, MD;  Location: [facility];  Service: Obstetrics;  Laterality: N/A;    SOCIAL HISTORY: Social History   Socioeconomic  History   Marital status: Single    Spouse name: Not on file   Number of children: Not on file   Years of education: Not on file   Highest education level: Some college, no degree   Occupational History   Not on file  Tobacco Use   Smoking status: Never   Smokeless tobacco: Never  Vaping Use   Vaping status: Never Used  Substance and Sexual Activity   Alcohol use: No   Drug use: No   Sexual activity: Yes    Partners: Male    Birth control/protection: None  Other Topics Concern   Not on file  Social History Narrative   Lives with her children   Caffeine:  1 cup coffee/day, diet soda 1-2/day   Social Drivers of Health   Financial Resource Strain: Low Risk  (05/06/2023)   Overall Financial Resource Strain (CARDIA)    Difficulty of Paying Living Expenses: Not very hard  Food Insecurity: No Food Insecurity (05/06/2023)   Hunger Vital Sign    Worried About Running Out of Food in the Last Year: Never true    Ran Out of Food in the Last Year: Never true  Transportation Needs: No Transportation Needs (05/06/2023)   PRAPARE - Administrator, Civil Service (Medical): No    Lack of Transportation (Non-Medical): No  Physical Activity: Sufficiently Active (05/06/2023)   Exercise Vital Sign    Days of Exercise per Week: 3 days    Minutes of Exercise per Session: 60 min  Stress: Stress Concern Present (05/06/2023)   [employer] of Occupational Health - Occupational Stress Questionnaire    Feeling of Stress : Very much  Social Connections: Moderately Isolated (05/06/2023)   Social Connection and Isolation Panel    Frequency of Communication with Friends and Family: Twice a week    Frequency of Social Gatherings with Friends and Family: Never    Attends Religious Services: More than 4 times per year    Active Member of [employer] or Organizations: Yes    Attends Engineer, structural: 1 to 4 times per year    Marital Status: Never married  Catering manager Violence: Not on file    FAMILY HISTORY: Family History  Problem Relation Age of Onset   Hypertension Mother    Hypertension Father    Diabetes Father    Diabetes Maternal Grandmother     Hypertension Maternal Grandmother    Diabetes Maternal Grandfather    Hypertension Maternal Grandfather    Congestive Heart Failure Maternal Grandfather    Diabetes Paternal Grandmother    Hypertension Paternal Grandmother    Diabetes Paternal Grandfather    Hypertension Paternal Grandfather    Stroke Paternal Grandfather     ALLERGIES:  is allergic to shellfish allergy and fish allergy.  MEDICATIONS:  Current Outpatient Medications  Medication Sig Dispense Refill   benzonatate  (TESSALON  PERLES) 100 MG capsule Take 1 capsule (100 mg total) by mouth 3 (three) times daily as needed for cough. (Patient not taking: Reported on 08/24/2023) 30 capsule 1   blood glucose meter kit and supplies KIT Dispense based on patient and insurance preference. Check blood sugar two times a day and as needed (FOR ICD-9 250.00, 250.01). 1 each 0   EPINEPHrine  0.3 mg/0.3 mL IJ SOAJ injection Inject 0.3 mg into the muscle as needed for anaphylaxis. 1 each 2   Ferric Maltol  (ACCRUFER ) 30 MG CAPS Take one tablet by mouth daily. 90 capsule 1   fluconazole  (DIFLUCAN )  100 MG tablet Take 1 tablet (100 mg total) by mouth daily. Take 1 tablet by mouth now repeat in 5 days (Patient not taking: Reported on 08/24/2023) 2 tablet 0   Prenatal Vit-Fe Fumarate-FA (PRENATAL PLUS VITAMIN/MINERAL) 27-1 MG TABS Take 1 tablet by mouth daily. 30 tablet 5   No current facility-administered medications for this visit.     PHYSICAL EXAMINATION: ECOG PERFORMANCE STATUS: 0 - Asymptomatic  There were no vitals filed for this visit. There were no vitals filed for this visit.  GENERAL:alert, no distress and comfortable SKIN: skin color, texture, turgor are normal, no rashes or significant lesions EYES: normal, conjunctiva are pink and non-injected, sclera clear OROPHARYNX:no exudate, no erythema and lips, buccal mucosa, and tongue normal  NECK: supple, thyroid  normal size, non-tender, without nodularity LYMPH:  no palpable  lymphadenopathy in the cervical, axillary or inguinal LUNGS: clear to auscultation and percussion with normal breathing effort HEART: regular rate & rhythm and no murmurs and no lower extremity edema ABDOMEN:abdomen soft, non-tender and normal bowel sounds Musculoskeletal:no cyanosis of digits and no clubbing  PSYCH: alert & oriented x 3 with fluent speech NEURO: no focal motor/sensory deficits  LABORATORY DATA:  I have reviewed the data as listed Lab Results  Component Value Date   WBC 6.7 08/24/2023   HGB 8.5 (L) 08/24/2023   HCT 28.4 (L) 08/24/2023   MCV 79 08/24/2023   PLT 368 08/24/2023     Chemistry      Component Value Date/Time   NA 140 08/24/2023 1214   K 4.3 08/24/2023 1214   CL 105 08/24/2023 1214   CO2 21 08/24/2023 1214   BUN 10 08/24/2023 1214   CREATININE 0.49 (L) 08/24/2023 1214      Component Value Date/Time   CALCIUM 8.8 08/24/2023 1214   ALKPHOS 70 08/24/2023 1214   AST 17 08/24/2023 1214   ALT 16 08/24/2023 1214   BILITOT 0.5 08/24/2023 1214       RADIOGRAPHIC STUDIES: I have personally reviewed the radiological images as listed and agreed with the findings in the report. No results found.  All questions were answered. The patient knows to call the clinic with any problems, questions or concerns. I spent 45 minutes in the care of this patient including H and P, review of records, counseling and coordination of care.     Amber Stalls, MD 10/01/2023 9:04 AM

## 2023-10-07 ENCOUNTER — Other Ambulatory Visit

## 2023-10-07 ENCOUNTER — Other Ambulatory Visit: Payer: Self-pay | Admitting: Nurse Practitioner

## 2023-10-07 DIAGNOSIS — O039 Complete or unspecified spontaneous abortion without complication: Secondary | ICD-10-CM

## 2023-10-08 LAB — BETA HCG QUANT (REF LAB): hCG Quant: 2 m[IU]/mL

## 2023-10-22 ENCOUNTER — Other Ambulatory Visit: Payer: Self-pay | Admitting: Hematology and Oncology

## 2023-10-22 ENCOUNTER — Telehealth (HOSPITAL_COMMUNITY): Payer: Self-pay | Admitting: Pharmacy Technician

## 2023-10-22 DIAGNOSIS — D500 Iron deficiency anemia secondary to blood loss (chronic): Secondary | ICD-10-CM

## 2023-10-22 DIAGNOSIS — D509 Iron deficiency anemia, unspecified: Secondary | ICD-10-CM | POA: Insufficient documentation

## 2023-10-22 NOTE — Telephone Encounter (Signed)
 Auth Submission: NO AUTH NEEDED Site of care: [facility] Payer: [employer] Medication & CPT/J Code(s) submitted: Venofer (Iron Sucrose) J1756 Diagnosis Code: D50.0 Route of submission (phone, fax, portal):  Phone # Fax # Auth type: Buy/Bill HB Units/visits requested: 300mg  x 3 doses Reference number: [id number] Approval from: 10/22/23 to 01/27/24    Dagoberto Armour, CPhT [facility] Phone: [phone] 10/22/2023

## 2023-10-29 ENCOUNTER — Ambulatory Visit (HOSPITAL_COMMUNITY)
Admission: RE | Admit: 2023-10-29 | Discharge: 2023-10-29 | Disposition: A | Discharge status: Home / Self Care | Source: Ambulatory Visit | Attending: Hematology and Oncology | Admitting: Hematology and Oncology

## 2023-10-29 VITALS — BP 122/76 | HR 70 | Temp 97.6°F | Resp 16

## 2023-10-29 DIAGNOSIS — D500 Iron deficiency anemia secondary to blood loss (chronic): Secondary | ICD-10-CM | POA: Insufficient documentation

## 2023-10-29 MED ORDER — IRON SUCROSE 300 MG IVPB - SIMPLE MED
300.0000 mg | Status: DC
  Administered 2023-10-29: 300 mg via INTRAVENOUS
  Filled 2023-10-29: qty 300

## 2023-11-04 ENCOUNTER — Encounter: Payer: Self-pay | Admitting: Nurse Practitioner

## 2023-11-04 ENCOUNTER — Ambulatory Visit: Payer: Self-pay | Admitting: Nurse Practitioner

## 2023-11-04 VITALS — BP 126/70 | HR 93 | Temp 98.1°F | Ht 68.0 in | Wt 337.0 lb

## 2023-11-04 DIAGNOSIS — Z2821 Immunization not carried out because of patient refusal: Secondary | ICD-10-CM

## 2023-11-04 DIAGNOSIS — Z3009 Encounter for other general counseling and advice on contraception: Secondary | ICD-10-CM

## 2023-11-04 DIAGNOSIS — E66813 Obesity, class 3: Secondary | ICD-10-CM

## 2023-11-04 DIAGNOSIS — Z6841 Body Mass Index (BMI) 40.0 and over, adult: Secondary | ICD-10-CM

## 2023-11-04 DIAGNOSIS — G4733 Obstructive sleep apnea (adult) (pediatric): Secondary | ICD-10-CM

## 2023-11-04 DIAGNOSIS — Z113 Encounter for screening for infections with a predominantly sexual mode of transmission: Secondary | ICD-10-CM

## 2023-11-04 DIAGNOSIS — E1169 Type 2 diabetes mellitus with other specified complication: Secondary | ICD-10-CM | POA: Diagnosis not present

## 2023-11-04 DIAGNOSIS — Z30011 Encounter for initial prescription of contraceptive pills: Secondary | ICD-10-CM

## 2023-11-04 DIAGNOSIS — Z114 Encounter for screening for human immunodeficiency virus [HIV]: Secondary | ICD-10-CM

## 2023-11-04 DIAGNOSIS — E119 Type 2 diabetes mellitus without complications: Secondary | ICD-10-CM

## 2023-11-04 LAB — POCT URINE PREGNANCY: Preg Test, Ur: NEGATIVE

## 2023-11-04 MED ORDER — DROSPIRENONE-ETHINYL ESTRADIOL 3-0.03 MG PO TABS: 1.0000 | ORAL_TABLET | Freq: Every day | ORAL | 1 refills | Status: AC

## 2023-11-04 MED ORDER — MOUNJARO 5 MG/0.5ML ~~LOC~~ SOAJ: 5.0000 mg | SUBCUTANEOUS | 0 refills | Status: DC

## 2023-11-04 NOTE — Progress Notes (Signed)
 LILLETTE Kristeen JINNY Gladis, CMA,acting as a Neurosurgeon for Kristin Ada, FNP.,have documented all relevant documentation on the behalf of Kristin Ada, FNP,as directed by  Kristin Ada, FNP while in the presence of Kristin Ada, FNP.  Subjective:  Patient ID: Kristin Love , female    DOB: [birth year] , 38 y.o.   MRN: [id number]  Chief Complaint  Patient presents with   Contraception    Patient presents today for wanting to start birth control. Patient would like to start the patch.    Here wanting to start birth control and restart her GLP1 since her miscarriage.         Past Medical History:  Diagnosis Date   Abnormal uterine bleeding (AUB) 07/15/2017   Heavy regular menses, with one episode of irregular/late timing and heavy menses 07/12/17     Acute sphenoidal sinusitis 12/07/2019   Diabetes mellitus without complication (HCC)    Herpes    PCOS (polycystic ovarian syndrome)    Vaginal Pap smear, abnormal    had colposcopy repeat pap negative     Family History  Problem Relation Age of Onset   Hypertension Mother    Hypertension Father    Diabetes Father    Diabetes Maternal Grandmother    Hypertension Maternal Grandmother    Diabetes Maternal Grandfather    Hypertension Maternal Grandfather    Congestive Heart Failure Maternal Grandfather    Diabetes Paternal Grandmother    Hypertension Paternal Grandmother    Diabetes Paternal Grandfather    Hypertension Paternal Grandfather    Stroke Paternal Grandfather      Current Outpatient Medications:    blood glucose meter kit and supplies KIT, Dispense based on patient and insurance preference. Check blood sugar two times a day and as needed (FOR ICD-9 250.00, 250.01)., Disp: 1 each, Rfl: 0   drospirenone-ethinyl estradiol  (YASMIN 28) 3-0.03 MG tablet, Take 1 tablet by mouth daily., Disp: 84 tablet, Rfl: 1   EPINEPHrine  0.3 mg/0.3 mL IJ SOAJ injection, Inject 0.3 mg into the muscle as needed for anaphylaxis., Disp: 1 each, Rfl: 2    Ferric Maltol  (ACCRUFER ) 30 MG CAPS, Take one tablet by mouth daily., Disp: 90 capsule, Rfl: 1   tirzepatide  (MOUNJARO ) 5 MG/0.5ML Pen, Inject 5 mg into the skin once a week., Disp: 2 mL, Rfl: 0   benzonatate  (TESSALON  PERLES) 100 MG capsule, Take 1 capsule (100 mg total) by mouth 3 (three) times daily as needed for cough. (Patient not taking: Reported on 11/04/2023), Disp: 30 capsule, Rfl: 1   fluconazole  (DIFLUCAN ) 100 MG tablet, Take 1 tablet (100 mg total) by mouth daily. Take 1 tablet by mouth now repeat in 5 days (Patient not taking: Reported on 11/04/2023), Disp: 2 tablet, Rfl: 0   Allergies  Allergen Reactions   Shellfish Allergy Anaphylaxis   Fish Allergy Swelling    Raw shrimp     Review of Systems  Constitutional: Negative.   Respiratory: Negative.    Cardiovascular: Negative.   Genitourinary:  Negative for vaginal discharge.  Neurological: Negative.   Psychiatric/Behavioral: Negative.       Today's Vitals   11/04/23 1159  BP: 126/70  Pulse: 93  Temp: 98.1 F (36.7 C)  TempSrc: Oral  Weight: (!) 337 lb (152.9 kg)  Height: 5' 8 (1.727 m)  PainSc: 0-No pain   Body mass index is 51.24 kg/m.  Wt Readings from Last 3 Encounters:  11/04/23 (!) 337 lb (152.9 kg)  10/01/23 (!) 320 lb (145.2 kg)  08/24/23 ROLLEN)  322 lb (146.1 kg)     Objective:  Physical Exam Vitals and nursing note reviewed.  Constitutional:      General: She is not in acute distress.    Appearance: Normal appearance. She is well-developed. She is obese.  HENT:     Head: Normocephalic and atraumatic.  Eyes:     Pupils: Pupils are equal, round, and reactive to light.  Cardiovascular:     Rate and Rhythm: Normal rate and regular rhythm.     Pulses: Normal pulses.     Heart sounds: Normal heart sounds. No murmur heard. Pulmonary:     Effort: Pulmonary effort is normal. No respiratory distress.     Breath sounds: Normal breath sounds. No wheezing.  Musculoskeletal:        General: Normal range of  motion.  Skin:    General: Skin is warm and dry.     Capillary Refill: Capillary refill takes less than 2 seconds.  Neurological:     General: No focal deficit present.     Mental Status: She is alert and oriented to person, place, and time.     Cranial Nerves: No cranial nerve deficit.     Motor: No weakness.  Psychiatric:        Mood and Affect: Mood normal.       Assessment And Plan:  Birth control counseling -     POCT urine pregnancy  Influenza vaccination declined  Morbid obesity with BMI of 50.0-59.9, adult (HCC)  Screening for STDs (sexually transmitted diseases) -     NuSwab Vaginitis Plus (VG+) -     RPR  Oral contraception initial prescription -     Drospirenone-Ethinyl Estradiol ; Take 1 tablet by mouth daily.  Dispense: 84 tablet; Refill: 1  Class 3 severe obesity due to excess calories with serious comorbidity and body mass index (BMI) of 45.0 to 49.9 in adult [facility]) Assessment & Plan: She is encouraged to strive for BMI less than 30 to decrease cardiac risk. Advised to aim for at least 150 minutes of exercise per week. She has unfortunately gained approximately 17 lbs since her last visit.     Severe obstructive sleep apnea Assessment & Plan: She is not currently using a CPAP, discussed risk of cardiac events.    Encounter for HIV (human immunodeficiency virus) test -     HIV Antibody (routine testing w rflx)  Type 2 diabetes mellitus in patient with obesity [facility]) Assessment & Plan: Will restart her on Moujaro, she has had a beta Hcg of less than 5 since September 2025  Orders: -     Ambulatory referral to Ophthalmology -     Mounjaro ; Inject 5 mg into the skin once a week.  Dispense: 2 mL; Refill: 0    Return for keep same next.  Patient was given opportunity to ask questions. Patient verbalized understanding of the plan and was able to repeat key elements of the plan. All questions were answered to their satisfaction.   LILLETTE Kristin Ada, FNP,  have reviewed all documentation for this visit. The documentation on 11/04/23 for the exam, diagnosis, procedures, and orders are all accurate and complete.    IF YOU HAVE BEEN REFERRED TO A SPECIALIST, IT MAY TAKE 1-2 WEEKS TO SCHEDULE/PROCESS THE REFERRAL. IF YOU HAVE NOT HEARD FROM US /SPECIALIST IN TWO WEEKS, PLEASE GIVE US  A CALL AT [phone].

## 2023-11-05 ENCOUNTER — Encounter (HOSPITAL_COMMUNITY)
Admission: RE | Admit: 2023-11-05 | Discharge: 2023-11-05 | Disposition: A | Discharge status: Home / Self Care | Source: Ambulatory Visit | Attending: Hematology and Oncology | Admitting: Hematology and Oncology

## 2023-11-05 VITALS — BP 130/82 | HR 76 | Temp 98.1°F | Resp 16

## 2023-11-05 DIAGNOSIS — D500 Iron deficiency anemia secondary to blood loss (chronic): Secondary | ICD-10-CM | POA: Insufficient documentation

## 2023-11-05 LAB — RPR: RPR Ser Ql: NONREACTIVE

## 2023-11-05 LAB — HIV ANTIBODY (ROUTINE TESTING W REFLEX): HIV Screen 4th Generation wRfx: NONREACTIVE

## 2023-11-05 MED ORDER — IRON SUCROSE 300 MG IVPB - SIMPLE MED
300.0000 mg | Status: DC
  Administered 2023-11-05: 300 mg via INTRAVENOUS
  Filled 2023-11-05: qty 300

## 2023-11-06 LAB — NUSWAB VAGINITIS PLUS (VG+)
BVAB 2: HIGH {score} — AB
Candida albicans, NAA: NEGATIVE
Candida glabrata, NAA: NEGATIVE
Chlamydia trachomatis, NAA: NEGATIVE
Megasphaera 1: HIGH {score} — AB
Neisseria gonorrhoeae, NAA: NEGATIVE
Trich vag by NAA: NEGATIVE

## 2023-11-09 ENCOUNTER — Other Ambulatory Visit: Payer: Self-pay | Admitting: Nurse Practitioner

## 2023-11-12 ENCOUNTER — Encounter (HOSPITAL_COMMUNITY)
Admission: RE | Admit: 2023-11-12 | Discharge: 2023-11-12 | Disposition: A | Discharge status: Home / Self Care | Source: Ambulatory Visit | Attending: Hematology and Oncology | Admitting: Hematology and Oncology

## 2023-11-12 VITALS — BP 111/83 | HR 86 | Temp 98.0°F | Resp 17

## 2023-11-12 DIAGNOSIS — D500 Iron deficiency anemia secondary to blood loss (chronic): Secondary | ICD-10-CM

## 2023-11-12 MED ORDER — IRON SUCROSE 300 MG IVPB - SIMPLE MED
300.0000 mg | Status: DC
  Administered 2023-11-12: 300 mg via INTRAVENOUS
  Filled 2023-11-12: qty 300

## 2023-11-15 ENCOUNTER — Encounter: Payer: Self-pay | Admitting: Nurse Practitioner

## 2023-11-15 ENCOUNTER — Ambulatory Visit: Payer: Self-pay | Admitting: Nurse Practitioner

## 2023-11-15 DIAGNOSIS — B9689 Other specified bacterial agents as the cause of diseases classified elsewhere: Secondary | ICD-10-CM

## 2023-11-15 MED ORDER — TINIDAZOLE 500 MG PO TABS: 500.0000 mg | ORAL_TABLET | Freq: Every day | ORAL | 0 refills | Status: DC

## 2023-11-15 NOTE — Assessment & Plan Note (Signed)
 Will restart her on Moujaro, she has had a beta Hcg of less than 5 since September 2025

## 2023-11-15 NOTE — Assessment & Plan Note (Addendum)
 She is not currently using a CPAP, discussed risk of cardiac events.

## 2023-11-15 NOTE — Assessment & Plan Note (Addendum)
 She is encouraged to strive for BMI less than 30 to decrease cardiac risk. Advised to aim for at least 150 minutes of exercise per week. She has unfortunately gained approximately 17 lbs since her last visit.

## 2023-11-16 ENCOUNTER — Other Ambulatory Visit: Payer: Self-pay | Admitting: Orthopedic Surgery

## 2023-11-16 DIAGNOSIS — M542 Cervicalgia: Secondary | ICD-10-CM

## 2023-11-27 ENCOUNTER — Telehealth: Payer: Self-pay

## 2023-11-27 NOTE — Telephone Encounter (Signed)
 PA for mounjaro  5mg  sent to plan.

## 2023-12-08 ENCOUNTER — Encounter: Payer: Self-pay | Admitting: Nurse Practitioner

## 2023-12-09 ENCOUNTER — Other Ambulatory Visit: Payer: Self-pay

## 2023-12-09 ENCOUNTER — Telehealth: Payer: Self-pay

## 2023-12-09 NOTE — Telephone Encounter (Signed)
PA for [facility] sent to plan.

## 2023-12-18 ENCOUNTER — Encounter: Payer: Self-pay | Admitting: Nurse Practitioner

## 2023-12-29 ENCOUNTER — Ambulatory Visit: Payer: Self-pay | Admitting: Nurse Practitioner

## 2023-12-29 ENCOUNTER — Encounter: Payer: Self-pay | Admitting: Nurse Practitioner

## 2023-12-29 VITALS — BP 110/82 | HR 65 | Temp 98.1°F | Ht 68.0 in | Wt 335.4 lb

## 2023-12-29 DIAGNOSIS — F332 Major depressive disorder, recurrent severe without psychotic features: Secondary | ICD-10-CM

## 2023-12-29 DIAGNOSIS — E669 Obesity, unspecified: Secondary | ICD-10-CM

## 2023-12-29 DIAGNOSIS — E1169 Type 2 diabetes mellitus with other specified complication: Secondary | ICD-10-CM | POA: Diagnosis not present

## 2023-12-29 DIAGNOSIS — N760 Acute vaginitis: Secondary | ICD-10-CM | POA: Diagnosis not present

## 2023-12-29 DIAGNOSIS — Z6841 Body Mass Index (BMI) 40.0 and over, adult: Secondary | ICD-10-CM | POA: Diagnosis not present

## 2023-12-29 NOTE — Progress Notes (Unsigned)
 LILLETTE Kristeen JINNY Gladis, CMA,acting as a neurosurgeon for [name], FNP.,have documented all relevant documentation on the behalf of [name], FNP,as directed by  [name], FNP while in the presence of [name], FNP.  Subjective:  Patient ID: Kristin Love , female    DOB: 18-Jun-1985 , 38 y.o.   MRN: [id number]  Chief Complaint  Patient presents with   Diabetes    Patient presents today for a dm follow up, Patient reports compliance with medication. Patient denies any chest pain, SOB, or headaches. Patient hasn't started mounjaro  5mg  yet.    Vaginitis    Patient reports everytime she comes off her cycle she feels like she is getting BV.    HPI  Discussed the use of AI scribe software for clinical note transcription with the patient, who gave verbal consent to proceed.  History of Present Illness   Past Medical History:  Diagnosis Date   Abnormal uterine bleeding (AUB) 07/15/2017   Heavy regular menses, with one episode of irregular/late timing and heavy menses 07/12/17     Acute sphenoidal sinusitis 12/07/2019   Diabetes mellitus without complication (HCC)    Herpes    PCOS (polycystic ovarian syndrome)    Vaginal Pap smear, abnormal    had colposcopy repeat pap negative     Family History  Problem Relation Age of Onset   Hypertension Mother    Hypertension Father    Diabetes Father    Diabetes Maternal Grandmother    Hypertension Maternal Grandmother    Diabetes Maternal Grandfather    Hypertension Maternal Grandfather    Congestive Heart Failure Maternal Grandfather    Diabetes Paternal Grandmother    Hypertension Paternal Grandmother    Diabetes Paternal Grandfather    Hypertension Paternal Grandfather    Stroke Paternal Grandfather      Current Outpatient Medications:    blood glucose meter kit and supplies KIT, Dispense based on patient and insurance preference. Check blood sugar two times a day and as needed (FOR ICD-9 250.00, 250.01)., Disp: 1 each, Rfl:  0   drospirenone -ethinyl estradiol  (YASMIN  28) 3-0.03 MG tablet, Take 1 tablet by mouth daily., Disp: 84 tablet, Rfl: 1   EPINEPHrine  0.3 mg/0.3 mL IJ SOAJ injection, Inject 0.3 mg into the muscle as needed for anaphylaxis., Disp: 1 each, Rfl: 2   Ferric Maltol  (ACCRUFER ) 30 MG CAPS, Take one tablet by mouth daily., Disp: 90 capsule, Rfl: 1   tirzepatide  (MOUNJARO ) 5 MG/0.5ML Pen, Inject 5 mg into the skin once a week., Disp: 2 mL, Rfl: 0   benzonatate  (TESSALON  PERLES) 100 MG capsule, Take 1 capsule (100 mg total) by mouth 3 (three) times daily as needed for cough. (Patient not taking: Reported on 12/29/2023), Disp: 30 capsule, Rfl: 1   fluconazole  (DIFLUCAN ) 100 MG tablet, Take 1 tablet (100 mg total) by mouth daily. Take 1 tablet by mouth now repeat in 5 days (Patient not taking: Reported on 12/29/2023), Disp: 2 tablet, Rfl: 0   Allergies  Allergen Reactions   Shellfish Allergy Anaphylaxis   Fish Allergy Swelling    Raw shrimp     Review of Systems  Constitutional: Negative.   Respiratory: Negative.    Cardiovascular: Negative.   Genitourinary:  Negative for vaginal discharge.  Neurological: Negative.   Psychiatric/Behavioral: Negative.       Today's Vitals   12/29/23 1209  BP: 110/82  Pulse: 65  Temp: 98.1 F (36.7 C)  TempSrc: Oral  Weight: (!) 335 lb 6.4 oz (152.1  kg)  Height: 5' 8 (1.727 m)  PainSc: 0-No pain   Body mass index is 51 kg/m.  Wt Readings from Last 3 Encounters:  12/29/23 (!) 335 lb 6.4 oz (152.1 kg)  11/04/23 (!) 337 lb (152.9 kg)  10/01/23 (!) 320 lb (145.2 kg)     Objective:  Physical Exam Vitals and nursing note reviewed.  Constitutional:      General: She is not in acute distress.    Appearance: Normal appearance. She is well-developed. She is obese.  HENT:     Head: Normocephalic and atraumatic.  Eyes:     Pupils: Pupils are equal, round, and reactive to light.  Cardiovascular:     Rate and Rhythm: Normal rate and regular rhythm.      Pulses: Normal pulses.     Heart sounds: Normal heart sounds. No murmur heard. Pulmonary:     Effort: Pulmonary effort is normal. No respiratory distress.     Breath sounds: Normal breath sounds. No wheezing.  Musculoskeletal:        General: Normal range of motion.  Skin:    General: Skin is warm and dry.     Capillary Refill: Capillary refill takes less than 2 seconds.  Neurological:     General: No focal deficit present.     Mental Status: She is alert and oriented to person, place, and time.     Cranial Nerves: No cranial nerve deficit.     Motor: No weakness.  Psychiatric:        Mood and Affect: Mood normal.     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  Patient was given opportunity to ask questions. Patient verbalized understanding of the plan and was able to repeat key elements of the plan. All questions were answered to their satisfaction.   Collaboration of Care: Referral or follow-up with counselor/therapist AEB   Patient/Guardian was advised Release of Information must be obtained prior to any record release in order to collaborate their care with an outside provider. Patient/Guardian was advised if they have not already done so to contact the registration department to sign all necessary forms in order for us  to release information regarding their care  I, [name], FNP, have reviewed all documentation for this visit. The documentation on 12/29/23 for the exam, diagnosis, procedures, and orders are all accurate and complete.   IF YOU HAVE BEEN REFERRED TO A SPECIALIST, IT MAY TAKE 1-2 WEEKS TO SCHEDULE/PROCESS THE REFERRAL. IF YOU HAVE NOT HEARD FROM US /SPECIALIST IN TWO WEEKS, PLEASE GIVE US  A  CALL AT [phone] X [phone].

## 2023-12-30 ENCOUNTER — Ambulatory Visit: Payer: Self-pay | Admitting: Nurse Practitioner

## 2023-12-30 LAB — HEMOGLOBIN A1C
Est. average glucose Bld gHb Est-mCnc: 105 mg/dL
Hgb A1c MFr Bld: 5.3 % (ref 4.8–5.6)

## 2024-01-10 DIAGNOSIS — N760 Acute vaginitis: Secondary | ICD-10-CM | POA: Insufficient documentation

## 2024-01-10 NOTE — Assessment & Plan Note (Signed)
 Ongoing depression with trust issues and anxiety. Expressed need for therapy and acknowledged mental health impact. - Referred to counseling services for therapy. - Discussed potential referral to a female therapist if preferred. - Depression screen score is 0 however she feels depressed and anxious

## 2024-01-10 NOTE — Assessment & Plan Note (Signed)
 BMI 50.0-59.9. Current management includes Tirzepatide  and potential phentermine use. Considering gastric bypass if weight loss goals unmet. Increased hunger possibly related to PCOS. - Continue Tirzepatide  (Mounjaro ) 5 mg subcutaneous once a week. - Plan to restart phentermine for appetite control. - Encouraged exercise and dietary modifications. - Consider gastric bypass surgery if weight loss goals are not met.

## 2024-01-10 NOTE — Assessment & Plan Note (Signed)
 Previously elevated A1c, currently controlled with Tirzepatide . Recent dietary indiscretions may affect glycemic control. - Continue Tirzepatide  (Mounjaro ) 5 mg subcutaneous once a week. - Obtain A1c test before the end of the year.

## 2024-01-10 NOTE — Assessment & Plan Note (Signed)
 Recurrent post-menstrual vaginitis likely due to pH changes. Differential includes bacterial or viral infections. - Performed swab test for bacterial and viral infections. - Await results of swab test.

## 2024-01-13 ENCOUNTER — Encounter: Payer: Self-pay | Admitting: Nurse Practitioner

## 2024-01-14 ENCOUNTER — Other Ambulatory Visit: Payer: Self-pay | Admitting: Nurse Practitioner

## 2024-01-14 ENCOUNTER — Institutional Professional Consult (permissible substitution): Payer: Self-pay | Admitting: Licensed Clinical Social Worker

## 2024-01-14 DIAGNOSIS — E669 Obesity, unspecified: Secondary | ICD-10-CM

## 2024-01-14 MED ORDER — MOUNJARO 7.5 MG/0.5ML ~~LOC~~ SOAJ: 7.5000 mg | SUBCUTANEOUS | 1 refills | Status: AC

## 2024-01-25 ENCOUNTER — Other Ambulatory Visit: Payer: Self-pay

## 2024-01-25 DIAGNOSIS — B9689 Other specified bacterial agents as the cause of diseases classified elsewhere: Secondary | ICD-10-CM

## 2024-01-25 MED ORDER — TINIDAZOLE 500 MG PO TABS: ORAL_TABLET | ORAL | 0 refills | Status: AC

## 2024-02-04 ENCOUNTER — Ambulatory Visit: Payer: Self-pay | Admitting: Licensed Clinical Social Worker

## 2024-02-04 ENCOUNTER — Other Ambulatory Visit: Payer: Self-pay

## 2024-02-04 DIAGNOSIS — F331 Major depressive disorder, recurrent, moderate: Secondary | ICD-10-CM

## 2024-02-04 DIAGNOSIS — F422 Mixed obsessional thoughts and acts: Secondary | ICD-10-CM

## 2024-02-04 DIAGNOSIS — F411 Generalized anxiety disorder: Secondary | ICD-10-CM

## 2024-02-04 MED ORDER — FLUCONAZOLE 100 MG PO TABS: 100.0000 mg | ORAL_TABLET | Freq: Every day | ORAL | 0 refills | Status: AC
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
## 2024-02-04 NOTE — Telephone Encounter (Signed)
 Please send diflucan  100 mg tab take one now and repeat in 5 days.   Kindest Regards,   [name], DNP, FNP-BC

## 2024-02-05 ENCOUNTER — Encounter: Payer: Self-pay | Admitting: Nurse Practitioner

## 2024-02-05 NOTE — Patient Instructions (Signed)
 Using Behavioral Activation to manage stress/depression symptoms     Identify/understand your own mood triggers.   Structure your day--get up around the same time, eat meals/snacks around the same time, go to bed around the same time.   Purposefully schedule self care time and time to complete tasks. This can include quiet time.  Stimulate your brain--go for a walk, text/call a friend or family member, if you are indoors--go outside (and vice versa), go for a drive, go to a store with bright colors and bright lights. Try to do things in a different way--drive to your favorite places using an alternative route, or instead of starting on the right side of the grocery store when shopping, start on the left side. You might feel a bit uncomfortable doing things outside of the comfort zone, but this is helping the brain create new neural pathways and is very healthy for brain/emotional health.   Physical movement based on your ability. If you can go for a walk, do stretches, even waving your hands to music can trigger feel-good endorphins in the brain and help release physical tension we all hold in our bodies.  Even 5 minutes can make a difference.   Be intentional about doing things that bring you joy (or used to bring you joy), and look for the things in every day that make you happy.  Seek those glimmers of joy each day.  Set a timer for 5 minutes for a harder task (ex. Laundry, washing dishes).  Allow yourself to work distraction-free for 5 minutes, then stop when the timer goes off. If you need a break, take a break. If you want to continue working then set another timer for whatever time you choose.   Limit or eliminate substance use including alcohol, marijuana, or recreational use of prescription medication.  Let in the light!! Open the window blinds, curtains and let natural light in. Even sitting near a window or sitting outside can boost your mood, especially in the wintertime when  there is less daylight.   If you take medication to manage symptoms, remember to take all medications as they are prescribed (please read all labels!!)    Things to envision for ourselves to to improve inspiration, motivation, and initiative :  improving physical wellness, focus on family relationships, focusing on our own mental/emotional well being, being a part of a bigger community, finding a hobby, being a part of something that fosters personal growth, engaging socially with others (even digitally!!!)    ANXIETY/PANIC EPISODE MANAGEMENT (CBT/MINDFULNESS BASED)   If you are in a highly stimulating or triggering environment, change your location to a less stimulating or safer environment .   Stimulate your senses by tasting/eating a sour candy (such as a lemon drop) or a strong flavored cough drop.  This triggers smell, taste, and touch since we  have had lots of nerve endings inside of your mouth.   Practice slow, controlled breathing to avoid hyperventilation.  Breathing into your nose for the count of 4 inside your head, holding your breath for the count of 4 inside your head, and exhale slowly counting to 8 inside your head.  This is called 4-4-8 breathing, or triangle breathing. (Controlled breathing). This helps manage panic, anxiety, anger, and tearfulness.  Additional grounding exercises include rubbing your hands softly together, or wiggling toes inside your shoes, pretending to grasp the floor with your toes.    Listen to calming music or sounds  Count backwards in your head by  twos or tens or recite multiplication tables in your head.  Visualize a calming, happy place and identify 5 explicit details about this place (color, temperature, smells, visual details, etc.)  Splash cold water on your face or hold an ice cube in your hand (alternate hands)  Clench and release muscle groups (hands, shoulders, facial muscles)  Engage in soothing activities to recover after a  stressful/anxious episode such as: drinking a warm beverage, sitting in your favorite comfortable location, positive physical contact with a pet or weighted blanket, using positive self talk/positive affirmations.   Download PTSD Coach app for your phone or tablet--its free and has lots of tips to help you manage panic episodes no matter where you are!    Emergency Resources:  National Suicide & Crisis Lifeline: Call or text 988  Crisis Text Line: Text HOME to 332-080-6994  [facility]  [address] [place], [place] [address] [phone] or [phone] [facility] 24/7 FOR ANYONE [address], [place], [place]  [phone] Fax: [phone] [email or web address] *Interpreters available *Accepts all insurance and uninsured for Urgent Care needs *Accepts Medicaid and uninsured for outpatient treatment (below)

## 2024-02-10 ENCOUNTER — Telehealth: Payer: Self-pay | Admitting: Licensed Clinical Social Worker

## 2024-02-10 DIAGNOSIS — F331 Major depressive disorder, recurrent, moderate: Secondary | ICD-10-CM

## 2024-02-10 DIAGNOSIS — F411 Generalized anxiety disorder: Secondary | ICD-10-CM

## 2024-02-10 DIAGNOSIS — F422 Mixed obsessional thoughts and acts: Secondary | ICD-10-CM

## 2024-02-11 NOTE — BH Specialist Note (Signed)
 Attestation signed by Warren Becker, PMHNP, DNP 02/11/2024 8:26 PM   Collaborative Care Psychiatric Consultant Case Review   Assessment/Provisional Diagnosis 39 year old female with history of sleep apnea, obesity, prediabetes, PCOS, and other medical issues. The patient is referred for anxiety and depression.   Provisional Diagnosis: # MDD, Recurrent, moderately severe # GAD    Recommendation 1. Recommend Lexapro  10 mg daily, gabapentin 100 mg BID PRN anxiety, and a vitamin D  supplement 2. BH specialist to follow up.   Thank you for your consult. Please contact our collaborative care team for any questions or concerns.     The above treatment considerations and suggestions are based on consultation with the [facility] specialist and/or PCP and a review of information available in the shared registry and the patient's Electronic Health Record (EHR). I have not personally examined the patient. All recommendations should be implemented with consideration of the patient's relevant prior history and current clinical status. Please feel free to call me with any questions about the care of this patient.     Virtual Behavioral Health Treatment Plan Team Note  MRN: [id number] NAME: Kristin Love  DATE: 02/11/24  Start time: Start Time: 1710 End time: Stop Time: 1730 Total time: Total Time in Minutes (Visit): 20  Total number of Virtual BH Treatment Team Plan encounters: 1/4  Treatment Team Attendees: Tin Engram, LCSW and Sharlot Becker, DNP   Diagnoses:    ICD-10-CM   1. MDD (major depressive disorder), recurrent episode, moderate (HCC)  F33.1     2. Generalized anxiety disorder  F41.1     3. Mixed obsessional thoughts and acts  F42.2       Goals, Interventions and Follow-up Plan Goals: Increase healthy adjustment to current life circumstances Interventions: Motivational Interviewing Behavioral Activation Supportive Counseling  Medication Management Recommendations:  1.  Recommend Lexapro  10 mg daily, gabapentin 100 mg BID PRN anxiety, and a vitamin D  supplement   Follow-up Plan: [facility] Collaborative Care Team  History of the present illness Presenting Problem/Current Symptoms: Kristin Love is a 39 y.o. y.o. adult patient with history of depression and anxiety seen in consultation at the request of [name] FNP for establishment of [facility] Collaborative Care management. Pt is currently taking the following psychiatric medications: none . Current symptoms include: excoriation (picking skin around cuticles/fingers, picking on face, picking on stomach), little interest or pleasure in doing things, trouble falling asleep and staying asleep most days, feeling tired/low energy, poor appetite fluctuating with binge eating, feeling bad about self, trouble focusing and concentrating on things, passive suicidal ideation with no plan or intent to follow through, feeling nervous anxious on the edge, too much worrying, trouble relaxing, internal restlessness, increased irritability.  Pt reports passive suicidal ideation with no intent to follow through or plans. Pt reports no HI, or AVH at time of session. Pt denies substance use.  Patient reports that one of her mother's friends is her primary support person at time of session.  History of present illness: Kristin Love reports that they have a history of depression and anxiety escalating from several external stressors and have had the following treatments: Medication and psychotherapy in the past.  Pt reports concerns about medical history including past history of gastric bypass, obstructive sleep apnea, history of PCOS, type 2 diabetes, headaches at time of session.  Pt reports that current external stressors include family conflict, . Pt feels that symptoms of stress, depression, and anxiety are impacting everyday functioning including sleep quality/quantity,  social interaction, ability to engage in recreational activities,  impacting appetite, and ability to engage with tasks both inside and outside of the home. KEIR FOLAND reports limited support system at time of assessment.   Psychiatric History    Have you ever been treated for a mental health problem? Yes If Yes, when were you treated and whom did you see (psychiatrist/counselor) ?        When: in past              Name of provider: PCP        Depression: Yes Anxiety: Yes with panic attacks (rare but intense) Mania: No Psychosis: No PTSD symptoms: Yes--depression and anxiety symptoms   Past Psychiatric History/Hospitalization(s): Hospitalization for psychiatric illness: Yes panic attack in the past (grocery store and one in the bathroom floor at work).    Prior Self-injurious behavior: Yes   Have you ever had thoughts of harming yourself or others or attempted suicide? No plan to harm self or others  Psychosocial stressors Flowsheet Row Integrated Behavioral Health from 02/04/2024 in [facility]  Current Stressors Family conflict, Grief/losses, Peer relationships, Separation  Familial Stressors None  Sleep Decreased, Difficulty falling asleep, Difficulty staying asleep  Appetite Increased  [binge eating at times]  Coping ability Overwhelmed  Patient taking medications as prescribed No prescribed medications    Self-harm Behaviors Risk Assessment Flowsheet Row Integrated Behavioral Health from 02/04/2024 in [facility]  Self-harm risk factors Family or marital conflict, Loss (financial/interpersonal/professional), Previous suicide attempts, Social withdrawal/isolation  Have you recently had any thoughts about harming yourself? Yes    Screenings PHQ-9 Assessments:     02/04/2024    2:47 PM 12/29/2023   12:13 PM 10/01/2023    9:50 AM  Depression screen PHQ 2/9  Decreased Interest 1 0 0  Down, Depressed, Hopeless 3 0 0  PHQ - 2 Score 4 0 0  Altered sleeping 3 0    Tired, decreased energy 2 0   Change in appetite 3 0   Feeling bad or failure about yourself  3 0   Trouble concentrating 3 0   Moving slowly or fidgety/restless 0 0   Suicidal thoughts 1 0   PHQ-9 Score 19 0   Difficult doing work/chores Very difficult Not difficult at all    GAD-7 Assessments:     02/04/2024    2:49 PM 08/24/2023   12:36 PM 05/06/2023   12:21 PM 08/20/2022   11:21 AM  GAD 7 : Generalized Anxiety Score  Nervous, Anxious, on Edge 3 3 3 1   Control/stop worrying 3 2 3 2   Worry too much - different things 3 3 3 3   Trouble relaxing 2 2 2 2   Restless 3 2 3 2   Easily annoyed or irritable 3 2 3 3   Afraid - awful might happen 3 1 3 1   Total GAD 7 Score 20 15 20 14   Anxiety Difficulty  Somewhat difficult Somewhat difficult Not difficult at all    Past Medical History Past Medical History:  Diagnosis Date   Abnormal uterine bleeding (AUB) 07/15/2017   Heavy regular menses, with one episode of irregular/late timing and heavy menses 07/12/17     Acute sphenoidal sinusitis 12/07/2019   Diabetes mellitus without complication (HCC)    Herpes    PCOS (polycystic ovarian syndrome)    Vaginal Pap smear, abnormal    had colposcopy repeat pap negative    Vital signs: There  were no vitals filed for this visit.  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 Check blood sugar two times a day and as needed (FOR ICD-9 250.00, 250.01).   drospirenone -ethinyl estradiol  (YASMIN  28) 3-0.03 MG tablet Take 1 tablet by mouth daily.   EPINEPHrine  0.3 mg/0.3 mL IJ SOAJ injection Inject 0.3 mg into the muscle as needed for anaphylaxis.   Ferric Maltol   (ACCRUFER ) 30 MG CAPS Take one tablet by mouth daily.   fluconazole  (DIFLUCAN ) 100 MG tablet Take 1 tablet (100 mg total) by mouth daily. Take 1 tablet by mouth now repeat in 5 days (Patient not taking: Reported on 12/29/2023)   fluconazole  (DIFLUCAN ) 100 MG tablet Take 1 tablet (100 mg total) by mouth daily. Take 1 tablet by mouth now repeat in 5 days   tinidazole  (TINDAMAX ) 500 MG tablet Take 2 tablets once daily for 5 days.   tirzepatide  (MOUNJARO ) 7.5 MG/0.5ML Pen Inject 7.5 mg into the skin once a week.   No facility-administered encounter medications on file as of 02/10/2024.     Scribe for Treatment Team: Desean Heemstra R Miklos Bidinger, LCSW

## 2024-02-16 ENCOUNTER — Other Ambulatory Visit

## 2024-02-18 ENCOUNTER — Ambulatory Visit: Payer: Self-pay | Admitting: Licensed Clinical Social Worker

## 2024-02-18 NOTE — BH Specialist Note (Addendum)
 Virtual Visit via Audio Note  I connected with Kristin Love on 02/18/24 at 12:30 PM EST by an audio enabled telemedicine application and verified that I am speaking with the correct person using two identifiers.  Location: Patient: Primary residence, [place] Provider: [facility] ([facility]), [place]   I discussed the limitations of evaluation and management by telemedicine and the availability of in person appointments. The patient expressed understanding and agreed to proceed.   I discussed the assessment and treatment plan with the patient. The patient was provided an opportunity to ask questions and all were answered. The patient agreed with the plan and demonstrated an understanding of the instructions.   Integrated Behavioral Health Follow Up  Visit  MRN: [id number] Name: Kristin Love  Number of Integrated Behavioral Health Clinician visits: 3- Third Visit  Session Start time: 1245   Session End time: 1300  Total time in minutes: 15  Encounter Diagnoses  Name Primary?   MDD (major depressive disorder), recurrent episode, moderate (HCC) Yes   Generalized anxiety disorder    Mixed obsessional thoughts and acts     Types of Service: Telephone visit and Collaborative care   Subjective: Kristin Love is a 39 y.o. y.o. adult patient with history of depression and anxiety seen in consultation at the request of [name] FNP for establishment of [facility] management.    Patient reports the following symptoms/concerns: Current symptoms include: excoriation (picking skin around cuticles/fingers, picking on face, picking on stomach), little interest or pleasure in doing things, trouble falling asleep and staying asleep most days, feeling tired/low energy, poor appetite fluctuating with binge eating, feeling bad about self, trouble focusing and concentrating on things, passive suicidal ideation with no plan or intent to follow through, feeling  nervous anxious on the edge, too much worrying, trouble relaxing, internal restlessness, increased irritability.  Pt reports passive suicidal ideation with no intent to follow through or plans. Pt reports no HI, or AVH at time of session. Pt denies substance use.  Patient reports that one of her mother's friends is her primary support person at time of session.   Duration of problem: ongoing; Severity of problem: moderate-severe  Objective: Mood: Depressed and Affect: Depressed Risk of harm to self or others: No plan to harm self or others  Life Context: Family and Social: Pt reports that she is in a state of shock after the recent unexpected death of her ex. Pt reports that she is experiencing a lot of self-blame about this, which is triggering more depression, stress, and anxiety I am thinking about it all the time   School/Work: pt reports that she's doing okay at work and that they are supportive of her current situation  Self-Care: Patient admits that she is unable to prioritize self-care at time of assessment.  Patient is able to identify self-care activities.  Life Changes: Patient reports sudden loss of her ex that has triggered significant feelings of guilt, sadness, and grief  Patient and/or Family's Strengths/Protective Factors: Social connections and [place] supports in place (healthy food, safe environments, etc.)  Goals Addressed: Patient will:  Reduce symptoms of: anxiety and depression   Increase knowledge and/or ability of: coping skills, healthy habits, self-management skills, and stress reduction   Demonstrate ability to: Increase healthy adjustment to current life circumstances  Progress towards Goals: Ongoing  Interventions: Interventions utilized:  Solution-Focused Strategies, Medication Monitoring, and Supportive Counseling Standardized Assessments completed: C-SSRS Short, GAD-7, and PHQ 9  02/04/2024    2:47 PM 12/29/2023   12:13 PM 10/01/2023    9:50 AM   Depression screen PHQ 2/9  Decreased Interest 1 0 0  Down, Depressed, Hopeless 3 0 0  PHQ - 2 Score 4 0 0  Altered sleeping 3 0   Tired, decreased energy 2 0   Change in appetite 3 0   Feeling bad or failure about yourself  3 0   Trouble concentrating 3 0   Moving slowly or fidgety/restless 0 0   Suicidal thoughts 1 0   PHQ-9 Score 19 0   Difficult doing work/chores Very difficult Not difficult at all       02/04/2024    2:49 PM 08/24/2023   12:36 PM 05/06/2023   12:21 PM 08/20/2022   11:21 AM  GAD 7 : Generalized Anxiety Score  Nervous, Anxious, on Edge 3  3  3  1    Control/stop worrying 3  2  3  2    Worry too much - different things 3  3  3  3    Trouble relaxing 2  2  2  2    Restless 3  2  3  2    Easily annoyed or irritable 3  2  3  3    Afraid - awful might happen 3  1  3  1    Total GAD 7 Score 20 15 20 14   Anxiety Difficulty  Somewhat difficult Somewhat difficult Not difficult at all     Data saved with a previous flowsheet row definition    Flowsheet Row Integrated Behavioral Health from 02/18/2024 in [facility] Integrated Behavioral Health from 02/04/2024 in [facility] IV Medication 180 from 10/29/2023 in [facility] at [facility]  C-SSRS RISK CATEGORY Error: Q6 is Yes, you must answer 7 Error: Q6 is Yes, you must answer 7 No Risk    Patient and/or Family Response: Patient made the comment  I feel like a helium balloon, that I am just floating around all over the place.  Patient states that she is willing to research medications that were recommended by [facility] collaborative care team.  Patient Centered Plan: Patient is on the following Treatment Plan(s):   1. Recommend Lexapro  10 mg daily, gabapentin 100 mg BID PRN anxiety, and a vitamin D  supplement 2. BH specialist to follow up.   Clinical Assessment/Diagnosis  Encounter Diagnoses  Name Primary?   MDD (major depressive disorder),  recurrent episode, moderate (HCC) Yes   Generalized anxiety disorder    Mixed obsessional thoughts and acts     Assessment: IBH clinician reviewed KEWANA SANON 's PHQ-9, GAD-7, and CSSRS scores. Scores reflect ongoing symptoms related to depression and anxiety exacerbated with patient's recent loss of ex.SABRA IBH clinician explored current external stressors (grief, mood/anxiety) that pt is experiencing and reviewed coping skills that patient is utilizing and provided additional recommendations of alternative coping skills to support symptom management. IBH clinician provided pt with psychoeducational materials in after visit summary, and instructed pt to review materials through their confidential MyChart portal. IBH clinician encouraging continued [facility] Collaborative Care Team supports including counseling and medication management.    Plan: Follow up with behavioral health clinician on : 02/25/24 [facility] clinician to reach out to PCP about medication recommendations on Treatment Plan for pt and to send in medication if they approve Behavioral recommendations:   Medication compliance Review behavioral activiation strategies Positive social engagement Prioritize self care strategies  Referral(s): Integrated [employer] (In Clinic)  [place] R Caylin Raby, LCSW

## 2024-02-18 NOTE — Patient Instructions (Signed)
 Emergency Resources:  National Suicide & Crisis Lifeline: Call or text 988  Crisis Text Line: Text HOME to 262-823-6242  [facility]  [address], [place], [place] [address] [phone] or [phone] [facility] 24/7 FOR ANYONE [address] [place], [place]  [phone] Fax: [phone] [email or web address] *Interpreters available *Accepts all insurance and uninsured for Urgent Care needs *Accepts Medicaid and uninsured for outpatient treatment (below)     Using Behavioral Activation to manage stress/depression symptoms     Identify/understand your own mood triggers.   Structure your day--get up around the same time, eat meals/snacks around the same time, go to bed around the same time.   Purposefully schedule self care time and time to complete tasks. This can include quiet time.  Stimulate your brain--go for a walk, text/call a friend or family member, if you are indoors--go outside (and vice versa), go for a drive, go to a store with bright colors and bright lights. Try to do things in a different way--drive to your favorite places using an alternative route, or instead of starting on the right side of the grocery store when shopping, start on the left side. You might feel a bit uncomfortable doing things outside of the comfort zone, but this is helping the brain create new neural pathways and is very healthy for brain/emotional health.   Physical movement based on your ability. If you can go for a walk, do stretches, even waving your hands to music can trigger feel-good endorphins in the brain and help release physical tension we all hold in our bodies.  Even 5 minutes can make a difference.   Be intentional about doing things that bring you joy (or used to bring you joy), and look for the things in every day that make you happy.  Seek those glimmers of joy each day.  Set a timer for 5 minutes for a harder task (ex. Laundry, washing dishes).   Allow yourself to work distraction-free for 5 minutes, then stop when the timer goes off. If you need a break, take a break. If you want to continue working then set another timer for whatever time you choose.   Limit or eliminate substance use including alcohol, marijuana, or recreational use of prescription medication.  Let in the light!! Open the window blinds, curtains and let natural light in. Even sitting near a window or sitting outside can boost your mood, especially in the wintertime when there is less daylight.   If you take medication to manage symptoms, remember to take all medications as they are prescribed (please read all labels!!)    Things to envision for ourselves to to improve inspiration, motivation, and initiative :  improving physical wellness, focus on family relationships, focusing on our own mental/emotional well being, being a part of a bigger community, finding a hobby, being a part of something that fosters personal growth, engaging socially with others (even digitally!!!)

## 2024-02-18 NOTE — BH Specialist Note (Signed)
 SABRA

## 2024-02-22 ENCOUNTER — Other Ambulatory Visit: Payer: Self-pay | Admitting: Nurse Practitioner

## 2024-02-22 DIAGNOSIS — F411 Generalized anxiety disorder: Secondary | ICD-10-CM

## 2024-02-22 DIAGNOSIS — F331 Major depressive disorder, recurrent, moderate: Secondary | ICD-10-CM

## 2024-02-22 DIAGNOSIS — F422 Mixed obsessional thoughts and acts: Secondary | ICD-10-CM

## 2024-02-22 MED ORDER — GABAPENTIN 100 MG PO CAPS: 100.0000 mg | ORAL_CAPSULE | Freq: Two times a day (BID) | ORAL | 2 refills | Status: AC | PRN

## 2024-02-22 MED ORDER — ESCITALOPRAM OXALATE 10 MG PO TABS: 10.0000 mg | ORAL_TABLET | Freq: Every day | ORAL | 2 refills | Status: AC

## 2024-02-25 ENCOUNTER — Ambulatory Visit: Payer: Self-pay | Admitting: Licensed Clinical Social Worker

## 2024-02-25 DIAGNOSIS — F411 Generalized anxiety disorder: Secondary | ICD-10-CM

## 2024-02-25 DIAGNOSIS — F4321 Adjustment disorder with depressed mood: Secondary | ICD-10-CM

## 2024-02-25 DIAGNOSIS — F331 Major depressive disorder, recurrent, moderate: Secondary | ICD-10-CM

## 2024-02-25 NOTE — Patient Instructions (Signed)
 Acute Grief Worksheet  Recognizing Acute Grief Symptoms Emotional Symptoms  Intense sadness or crying spells  Anger or irritability  Anxiety or fear  Guilt or regret  Feeling overwhelmed or hopeless  Numbness or disbelief  Physical Symptoms  Fatigue or low energy  Changes in appetite  Sleep disturbances (insomnia or oversleeping)  Headaches or body aches  Increased heart rate or blood pressure  Cognitive Symptoms  Difficulty concentrating  Forgetfulness  Intrusive thoughts about the loss  Confusion or disorientation  Behavioral Symptoms  Social withdrawal  Avoidance of reminders of the loss  Restlessness or pacing  Crying unexpectedly  Strategies to Manage Acute Grief Choose strategies that feel most helpful to you:  Emotional Coping  Talk to someone  Share your feelings with a trusted friend, therapist, or support group.  Journal your thoughts  Writing can help process emotions.  Allow yourself to feel  Accept that grief is a natural response to loss.  Physical Self-Care  Maintain a routine  Regular sleep, meals, and movement can stabilize your mood.  Exercise gently  Walking, yoga, or stretching can reduce stress.  Eat nourishing foods  Avoid alcohol, marijuana, excessive caffeine, or sugar.  Social Support  Join a grief support group  Community groups often offer structured support.  Reach out to family or community  Connection helps reduce isolation.  Attend memorials or rituals  Honoring your loved one can bring closure.  Meaning-Making & Acceptance  Create a tribute  A photo album, letter, or art project in memory of your loved one.  Read grief literature  Books like 'On Grief & Grieving' or 'I Wasnt Ready to Say Goodbye'.  Practice mindfulness or prayer  Helps center your thoughts and emotions.  When to Seek Help If you experience any of the following, consider higher level of professional support:  Persistent  grief lasting more than 12 months  Thoughts of self-harm or suicide  Inability to function in daily life  Substance misuse or severe depression

## 2024-02-25 NOTE — BH Specialist Note (Signed)
 Virtual Visit via Audio Note  I connected with Kristin Love on 02/25/24 at 11:00 AM EST by an audio enabled telemedicine application and verified that I am speaking with the correct person using two identifiers.  Location: Patient: Primary residence, [place] Provider: [facility] ([facility]),    I discussed the limitations of evaluation and management by telemedicine and the availability of in person appointments. The patient expressed understanding and agreed to proceed.   I discussed the assessment and treatment plan with the patient. The patient was provided an opportunity to ask questions and all were answered. The patient agreed with the plan and demonstrated an understanding of the instructions.   Integrated Behavioral Health Follow Up  Visit  MRN: [id number] Name: Kristin Love  Number of Integrated Behavioral Health Clinician visits: 4- Fourth Visit  Session Start time: 1100   Session End time: 1115  Total time in minutes: 15  Encounter Diagnoses  Name Primary?   MDD (major depressive disorder), recurrent episode, moderate (HCC) Yes   Generalized anxiety disorder    Grief      Types of Service: Telephone visit and Collaborative care   Subjective: Kristin Love is a 39 y.o. y.o. adult patient with history of depression and anxiety seen in consultation at the request of [name] FNP for establishment of [facility] management.    Patient reports the following symptoms/concerns: Current symptoms include: excoriation (picking skin around cuticles/fingers, picking on face, picking on stomach), little interest or pleasure in doing things, trouble falling asleep and staying asleep most days, feeling tired/low energy, poor appetite fluctuating with binge eating, feeling bad about self, trouble focusing and concentrating on things, passive suicidal ideation with no plan or intent to follow through, feeling nervous anxious on the edge, too  much worrying, trouble relaxing, internal restlessness, increased irritability.  Pt reports passive suicidal ideation with no intent to follow through or plans. Pt reports no HI, or AVH at time of session. Pt denies substance use.  Patient reports that one of her mother's friends is her primary support person at time of session.   Duration of problem: ongoing; Severity of problem: moderate-severe  Objective: Mood: Depressed and Affect: Depressed Risk of harm to self or others: No plan to harm self or others  Life Context: Family and Social: Pt reports that she is in a state of shock after the recent unexpected death of her ex. Pt reports that she is experiencing a lot of self-blame about this, which is triggering more depression, stress, and anxiety I am thinking about it all the time. Pt reports that in addition to her feelings about her grief, she is also trying hard to support her daughter since this was daughter's father. I think she needs to get some counseling, as well. Encouraged pt to reach out to whatever community supports are available to her.   School/Work: pt reports that she's doing okay at work and that they are supportive of her current situation  Self-Care: Patient admits that she is unable to prioritize self-care at time of assessment.  Patient is able to identify self-care activities.  Life Changes: Patient continues to struggle with grief/loss associated with recent loss. Pt trying to help both herself and her daughter with everyday coping.  Patient and/or Family's Strengths/Protective Factors: Social connections and [place] supports in place (healthy food, safe environments, etc.)  Goals Addressed: Patient will:  Reduce symptoms of: anxiety and depression   Increase knowledge and/or ability of: coping  skills, healthy habits, self-management skills, and stress reduction   Demonstrate ability to: Increase healthy adjustment to current life circumstances  Progress  towards Goals: Ongoing  Interventions: Interventions utilized:  Solution-Focused Strategies, Medication Monitoring, and Supportive Counseling Standardized Assessments completed: C-SSRS Short, GAD-7, and PHQ 9     02/04/2024    2:47 PM 12/29/2023   12:13 PM 10/01/2023    9:50 AM  Depression screen PHQ 2/9  Decreased Interest 1 0 0  Down, Depressed, Hopeless 3 0 0  PHQ - 2 Score 4 0 0  Altered sleeping 3 0   Tired, decreased energy 2 0   Change in appetite 3 0   Feeling bad or failure about yourself  3 0   Trouble concentrating 3 0   Moving slowly or fidgety/restless 0 0   Suicidal thoughts 1 0   PHQ-9 Score 19 0   Difficult doing work/chores Very difficult Not difficult at all       02/04/2024    2:49 PM 08/24/2023   12:36 PM 05/06/2023   12:21 PM 08/20/2022   11:21 AM  GAD 7 : Generalized Anxiety Score  Nervous, Anxious, on Edge 3  3  3  1    Control/stop worrying 3  2  3  2    Worry too much - different things 3  3  3  3    Trouble relaxing 2  2  2  2    Restless 3  2  3  2    Easily annoyed or irritable 3  2  3  3    Afraid - awful might happen 3  1  3  1    Total GAD 7 Score 20 15 20 14   Anxiety Difficulty  Somewhat difficult Somewhat difficult Not difficult at all     Data saved with a previous flowsheet row definition    Flowsheet Row Integrated Behavioral Health from 02/25/2024 in [facility] Integrated Behavioral Health from 02/18/2024 in [facility] Integrated Behavioral Health from 02/04/2024 in [facility]  C-SSRS RISK CATEGORY Moderate Risk Moderate Risk Error: Q6 is Yes, you must answer 7    Patient and/or Family Response: Pt reports that she is still picking up the pieces after the sudden death of her ex husband.   Patient Centered Plan: Patient is on the following Treatment Plan(s):   1. Recommend Lexapro  10 mg daily, gabapentin  100 mg BID PRN anxiety, and a  vitamin D  supplement 2. BH specialist to follow up.   Clinical Assessment/Diagnosis  Encounter Diagnoses  Name Primary?   MDD (major depressive disorder), recurrent episode, moderate (HCC) Yes   Generalized anxiety disorder    Grief      Assessment: IBH clinician reviewed Kristin Love 's PHQ-9, GAD-7, and CSSRS scores. Scores reflect ongoing symptoms related to depression and anxiety exacerbated with patient's recent loss of ex.Kristin Love IBH clinician explored current external stressors (grief, mood/anxiety) that pt is experiencing and reviewed coping skills that patient is utilizing and provided additional recommendations of alternative coping skills to support symptom management.   Pt has not picked up recommended medication at pharmacy--pt states that she will pick up the medication today (1/29)  IBH clinician provided pt with psychoeducational materials in after visit summary, and instructed pt to review materials through their confidential MyChart portal. IBH clinician encouraging continued [facility] Collaborative Care Team supports including counseling and medication management.    Plan: Follow up with behavioral health clinician on :  03/15/24 Behavioral recommendations:   Medication compliance (pt picking up medication today 1/29) Review behavioral activiation strategies Positive social engagement Prioritize self care strategies  Referral(s): Integrated [employer] (In Clinic)  [place] R Nicklous Aburto, LCSW

## 2024-03-03 ENCOUNTER — Inpatient Hospital Stay: Admission: RE | Admit: 2024-03-03 | Source: Ambulatory Visit

## 2024-03-15 ENCOUNTER — Ambulatory Visit: Payer: Self-pay | Admitting: Licensed Clinical Social Worker

## 2024-03-30 ENCOUNTER — Inpatient Hospital Stay

## 2024-03-30 ENCOUNTER — Inpatient Hospital Stay: Admitting: Hematology and Oncology

## 2024-04-28 ENCOUNTER — Ambulatory Visit: Payer: Self-pay | Admitting: Nurse Practitioner

## 2024-08-29 ENCOUNTER — Encounter: Payer: Self-pay | Admitting: Nurse Practitioner
